# Patient Record
Sex: Male | Born: 1970 | Race: Black or African American | Hispanic: No | Marital: Married | Smoking: Never smoker
Health system: Southern US, Community
[De-identification: ages and names within clinical notes are randomized; demographics above are authoritative.]

## PROBLEM LIST (undated history)

## (undated) DIAGNOSIS — H3530 Unspecified macular degeneration: Secondary | ICD-10-CM

## (undated) DIAGNOSIS — R221 Localized swelling, mass and lump, neck: Secondary | ICD-10-CM

## (undated) DIAGNOSIS — E119 Type 2 diabetes mellitus without complications: Secondary | ICD-10-CM

## (undated) DIAGNOSIS — M25522 Pain in left elbow: Secondary | ICD-10-CM

## (undated) DIAGNOSIS — M79602 Pain in left arm: Secondary | ICD-10-CM

## (undated) DIAGNOSIS — R42 Dizziness and giddiness: Secondary | ICD-10-CM

## (undated) DIAGNOSIS — E785 Hyperlipidemia, unspecified: Secondary | ICD-10-CM

## (undated) DIAGNOSIS — T7840XA Allergy, unspecified, initial encounter: Secondary | ICD-10-CM

## (undated) DIAGNOSIS — K219 Gastro-esophageal reflux disease without esophagitis: Secondary | ICD-10-CM

## (undated) DIAGNOSIS — I10 Essential (primary) hypertension: Secondary | ICD-10-CM

## (undated) HISTORY — DX: Localized swelling, mass and lump, neck: R22.1

## (undated) HISTORY — DX: Essential (primary) hypertension: I10

## (undated) HISTORY — DX: Pain in left elbow: M25.522

## (undated) HISTORY — DX: Hyperlipidemia, unspecified: E78.5

## (undated) HISTORY — DX: Morbid (severe) obesity due to excess calories: E66.01

## (undated) HISTORY — DX: Pain in left arm: M79.602

## (undated) HISTORY — PX: NO PAST SURGERIES: SHX2092

## (undated) HISTORY — DX: Unspecified macular degeneration: H35.30

## (undated) HISTORY — DX: Allergy, unspecified, initial encounter: T78.40XA

## (undated) HISTORY — DX: Dizziness and giddiness: R42

## (undated) HISTORY — DX: Gastro-esophageal reflux disease without esophagitis: K21.9

---

## 2010-09-16 DIAGNOSIS — R221 Localized swelling, mass and lump, neck: Secondary | ICD-10-CM

## 2010-09-16 HISTORY — DX: Localized swelling, mass and lump, neck: R22.1

## 2010-09-25 ENCOUNTER — Encounter: Admission: RE | Admit: 2010-09-25 | Discharge: 2010-09-25 | Payer: Self-pay | Admitting: Family Medicine

## 2011-05-07 ENCOUNTER — Encounter: Payer: Self-pay | Admitting: Nurse Practitioner

## 2013-03-22 ENCOUNTER — Other Ambulatory Visit: Payer: Self-pay | Admitting: *Deleted

## 2013-03-22 MED ORDER — NIACIN ER (ANTIHYPERLIPIDEMIC) 1000 MG PO TBCR: 1000.0000 mg | EXTENDED_RELEASE_TABLET | Freq: Every day | ORAL | Status: DC

## 2013-03-27 ENCOUNTER — Telehealth: Payer: Self-pay | Admitting: Family Medicine

## 2013-03-27 NOTE — Telephone Encounter (Signed)
[facility] CALLED IT

## 2013-04-03 ENCOUNTER — Other Ambulatory Visit: Payer: Self-pay

## 2013-04-03 MED ORDER — NIACIN ER (ANTIHYPERLIPIDEMIC) 1000 MG PO TBCR
1000.0000 mg | EXTENDED_RELEASE_TABLET | Freq: Every day | ORAL | Status: DC
Start: 2013-04-03 — End: 2013-04-24

## 2013-04-24 ENCOUNTER — Ambulatory Visit (INDEPENDENT_AMBULATORY_CARE_PROVIDER_SITE_OTHER): Payer: BC Managed Care – PPO | Admitting: Family Medicine

## 2013-04-24 ENCOUNTER — Encounter: Payer: Self-pay | Admitting: Family Medicine

## 2013-04-24 VITALS — BP 121/82 | HR 67 | Temp 97.0°F | Wt 309.8 lb

## 2013-04-24 DIAGNOSIS — E785 Hyperlipidemia, unspecified: Secondary | ICD-10-CM | POA: Insufficient documentation

## 2013-04-24 DIAGNOSIS — M109 Gout, unspecified: Secondary | ICD-10-CM | POA: Insufficient documentation

## 2013-04-24 DIAGNOSIS — E669 Obesity, unspecified: Secondary | ICD-10-CM

## 2013-04-24 DIAGNOSIS — E119 Type 2 diabetes mellitus without complications: Secondary | ICD-10-CM | POA: Insufficient documentation

## 2013-04-24 LAB — HEPATIC FUNCTION PANEL
ALT: 20 U/L (ref 0–53)
AST: 17 U/L (ref 0–37)
Albumin: 4.3 g/dL (ref 3.5–5.2)
Alkaline Phosphatase: 56 U/L (ref 39–117)
Bilirubin, Direct: 0.2 mg/dL (ref 0.0–0.3)
Indirect Bilirubin: 0.8 mg/dL (ref 0.0–0.9)
Total Bilirubin: 1 mg/dL (ref 0.3–1.2)
Total Protein: 6.8 g/dL (ref 6.0–8.3)

## 2013-04-24 LAB — URIC ACID: Uric Acid, Serum: 5.8 mg/dL (ref 4.0–6.0)

## 2013-04-24 LAB — POCT UA - MICROALBUMIN: Microalbumin Ur, POC: 20 mg/dL

## 2013-04-24 LAB — BASIC METABOLIC PANEL WITH GFR
BUN: 7 mg/dL (ref 6–23)
CO2: 30 mEq/L (ref 19–32)
Calcium: 9.8 mg/dL (ref 8.4–10.5)
Chloride: 106 mEq/L (ref 96–112)
Creat: 0.81 mg/dL (ref 0.50–1.35)
GFR, Est African American: >89 mL/min
GFR, Est Non African American: >89 mL/min
Glucose, Bld: 92 mg/dL (ref 70–99)
Potassium: 4.1 mEq/L (ref 3.5–5.3)
Sodium: 140 mEq/L (ref 135–145)

## 2013-04-24 LAB — POCT GLYCOSYLATED HEMOGLOBIN (HGB A1C): Hemoglobin A1C: 5.5

## 2013-04-24 MED ORDER — NIACIN ER (ANTIHYPERLIPIDEMIC) 1000 MG PO TBCR: 1000.0000 mg | EXTENDED_RELEASE_TABLET | Freq: Every day | ORAL | Status: DC

## 2013-04-24 MED ORDER — LISINOPRIL 2.5 MG PO TABS: 2.5000 mg | ORAL_TABLET | Freq: Every day | ORAL | Status: DC

## 2013-04-24 MED ORDER — ROSUVASTATIN CALCIUM 10 MG PO TABS: 10.0000 mg | ORAL_TABLET | Freq: Every day | ORAL | Status: DC

## 2013-04-24 MED ORDER — METFORMIN HCL 500 MG PO TABS: 500.0000 mg | ORAL_TABLET | Freq: Every day | ORAL | Status: DC

## 2013-04-24 MED ORDER — ALLOPURINOL 300 MG PO TABS: 300.0000 mg | ORAL_TABLET | Freq: Every day | ORAL | Status: DC

## 2013-04-24 NOTE — Progress Notes (Signed)
Patient ID: Alex Harrison, male   DOB: [birth year], 42 y.o.   MRN: [id number] SUBJECTIVE: HPI: Patient is here for follow up of hyperlipidemia/gout/Diabetes Mellitus/obesity:  .Symptoms of DM:has had Nocturia once in a while. ,deniesUrinary Frequency ,denies Blurred vision ,deniesDizziness,denies.Dysuria,deniesparesthesias, deniesextremity pain or ulcers.Marland Kitchendenieschest pain. .has hadan annual eye exam. do check the feet. doescheck CBGs. Average CBG:____?____.Marland Kitchen deniesto episodes of hypoglycemia. doeshave an emergency hypoglycemic plan. admits toCompliance with medications. deniesProblems with medications. Has made lifestyle changes, lost weight and had to reduce the metformin to once daily.   PMH/PSH: reviewed/updated in Epic  SH/FH: reviewed/updated in Epic  Allergies: reviewed/updated in Epic  Medications: reviewed/updated in Epic  Immunizations: reviewed/updated in Epic  ROS: As above in the HPI. All other systems are stable or negative.  OBJECTIVE: APPEARANCE:  Patient in no acute distress.The patient appeared well nourished and normally developed. Acyanotic. Waist:58 VITAL SIGNS:BP 121/82  Pulse 67  Temp(Src) 97 F (36.1 C) (Oral)  Wt 309 lb 12.8 oz (140.524 kg) Pleasant AA male  SKIN: warm and  Dry without overt rashes, tattoos and scars  HEAD and Neck: without JVD, Head and scalp: normal Eyes:No scleral icterus. Fundi normal, eye movements normal. Ears: Auricle normal, canal normal, Tympanic membranes normal, insufflation normal. Nose: normal Throat: normal Neck & thyroid: normal  CHEST & LUNGS: Chest wall: normal Lungs: Clear  CVS: Reveals the PMI to be normally located. Regular rhythm, First and Second Heart sounds are normal,  absence of murmurs, rubs or gallops. Peripheral vasculature: Radial pulses: normal Dorsal pedis pulses: normal Posterior pulses: normal  ABDOMEN:  Appearance:obese Benign,, no organomegaly, no masses, no Abdominal Aortic  enlargement. No Guarding , no rebound. No Bruits. Bowel sounds: normal  RECTAL: N/A GU: N/A  EXTREMETIES: nonedematous. Both Femoral and Pedal pulses are normal.  MUSCULOSKELETAL:  Spine: normal Joints: intact  NEUROLOGIC: oriented to time,place and person; nonfocal.  ASSESSMENT: HLD (hyperlipidemia) - Plan: niacin (NIASPAN) 1000 MG CR tablet, rosuvastatin (CRESTOR) 10 MG tablet, Hepatic function panel, NMR Lipoprofile with Lipids  DM (diabetes mellitus) - Plan: metFORMIN (GLUCOPHAGE) 500 MG tablet, lisinopril (PRINIVIL,ZESTRIL) 2.5 MG tablet, BASIC METABOLIC PANEL WITH GFR, POCT glycosylated hemoglobin (Hb A1C), POCT UA - Microalbumin  Obesity, unspecified  Gout - Plan: allopurinol (ZYLOPRIM) 300 MG tablet, Uric acid  PLAN: Orders Placed This Encounter  Procedures  . BASIC METABOLIC PANEL WITH GFR  . Hepatic function panel  . NMR Lipoprofile with Lipids  . Uric acid  . POCT glycosylated hemoglobin (Hb A1C)  . POCT UA - Microalbumin   No results found for this or any previous visit. Meds ordered this encounter  Medications  . DISCONTD: lisinopril (PRINIVIL,ZESTRIL) 2.5 MG tablet    Sig: Take 2.5 mg by mouth daily.   Marland Kitchen LOTEMAX 0.5 % ophthalmic suspension    Sig: Place 1 drop into both eyes 4 (four) times daily.   Marland Kitchen DISCONTD: metFORMIN (GLUCOPHAGE) 500 MG tablet    Sig: Take 500 mg by mouth daily with breakfast.   . niacin (NIASPAN) 1000 MG CR tablet    Sig: Take 1 tablet (1,000 mg total) by mouth at bedtime.    Dispense:  30 tablet    Refill:  5  . rosuvastatin (CRESTOR) 10 MG tablet    Sig: Take 1 tablet (10 mg total) by mouth daily.    Dispense:  30 tablet    Refill:  5  . metFORMIN (GLUCOPHAGE) 500 MG tablet    Sig: Take 1 tablet (500 mg total) by mouth daily with  breakfast.    Dispense:  30 tablet    Refill:  5  . lisinopril (PRINIVIL,ZESTRIL) 2.5 MG tablet    Sig: Take 1 tablet (2.5 mg total) by mouth daily.    Dispense:  30 tablet    Refill:  5  .  allopurinol (ZYLOPRIM) 300 MG tablet    Sig: Take 1 tablet (300 mg total) by mouth daily.    Dispense:  30 tablet    Refill:  5   Discussed at length lefestyle changes.       Dr Woodroe Mode Recommendations  Diet and Exercise discussed with patient.  For nutrition information, I recommend books:  1).Eat to Live by Dr Monico Hoar. 2).Prevent and Reverse Heart Disease by Dr Suzzette Righter. 3) Dr Katherina Right reversing diabetes.  Exercise recommendations are:  If unable to walk, then the patient can exercise in a chair 3 times a day. By flapping arms like a bird gently and raising legs outwards to the front.  If ambulatory, the patient can go for walks for 30 minutes 3 times a week. Then increase the intensity and duration as tolerated.  Goal is to try to attain exercise frequency to 5 times a week.  If applicable: Best to perform resistance exercises (machines or weights) 2 days a week and cardio type exercises 3 days per week.  RTC in 3 months.  Alex Harrison P. [place] Charon, M.D.

## 2013-04-24 NOTE — Patient Instructions (Addendum)
Dr Woodroe Mode Recommendations  Diet and Exercise discussed with patient.  For nutrition information, I recommend books:  1).Eat to Live by Dr Monico Hoar. 2).Prevent and Reverse Heart Disease by Dr Suzzette Righter. 3) Dr Lequita Asal Reversing Diabetes.  Exercise recommendations are:  If unable to walk, then the patient can exercise in a chair 3 times a day. By flapping arms like a bird gently and raising legs outwards to the front.  If ambulatory, the patient can go for walks for 30 minutes 3 times a week. Then increase the intensity and duration as tolerated.  Goal is to try to attain exercise frequency to 5 times a week.  If applicable: Best to perform resistance exercises (machines or weights) 2 days a week and cardio type exercises 3 days per week. Diabetes and Exercise Regular exercise is important and can help:   Control blood glucose (sugar).  Decrease blood pressure.    Control blood lipids (cholesterol, triglycerides).  Improve overall health. BENEFITS FROM EXERCISE  Improved fitness.  Improved flexibility.  Improved endurance.  Increased bone density.  Weight control.  Increased muscle strength.  Decreased body fat.  Improvement of the body's use of insulin, a hormone.  Increased insulin sensitivity.  Reduction of insulin needs.  Reduced stress and tension.  Helps you feel better. People with diabetes who add exercise to their lifestyle gain additional benefits, including:  Weight loss.  Reduced appetite.  Improvement of the body's use of blood glucose.  Decreased risk factors for heart disease:  Lowering of cholesterol and triglycerides.  Raising the level of good cholesterol (high-density lipoproteins, HDL).  Lowering blood sugar.  Decreased blood pressure. TYPE 1 DIABETES AND EXERCISE  Exercise will usually lower your blood glucose.  If blood glucose is greater than 240 mg/dl, check urine ketones. If ketones are  present, do not exercise.  Location of the insulin injection sites may need to be adjusted with exercise. Avoid injecting insulin into areas of the body that will be exercised. For example, avoid injecting insulin into:  The arms when playing tennis.  The legs when jogging. For more information, discuss this with your caregiver.  Keep a record of:  Food intake.  Type and amount of exercise.  Expected peak times of insulin action.  Blood glucose levels. Do this before, during, and after exercise. Review your records with your caregiver. This will help you to develop guidelines for adjusting food intake and insulin amounts.  TYPE 2 DIABETES AND EXERCISE  Regular physical activity can help control blood glucose.  Exercise is important because it may:  Increase the body's sensitivity to insulin.  Improve blood glucose control.  Exercise reduces the risk of heart disease. It decreases serum cholesterol and triglycerides. It also lowers blood pressure.  Those who take insulin or oral hypoglycemic agents should watch for signs of hypoglycemia. These signs include dizziness, shaking, sweating, chills, and confusion.  Body water is lost during exercise. It must be replaced. This will help to avoid loss of body fluids (dehydration) or heat stroke. Be sure to talk to your caregiver before starting an exercise program to make sure it is safe for you. Remember, any activity is better than none.  Document Released: 02/19/2004 Document Revised: 02/21/2012 Document Reviewed: 06/05/2009 [facility] Patient Information 2013 [place], [place].   Diabetes, Type 2 Diabetes is a long-lasting (chronic) disease. In type 2 diabetes, the pancreas does not make enough insulin (a hormone), and the body does not respond normally to  the insulin that is made. This type of diabetes was also previously called adult-onset diabetes. It usually occurs after the age of 70, but it can occur at any age.  CAUSES  Type 2  diabetes happens because the pancreasis not making enough insulin or your body has trouble using the insulin that your pancreas does make properly. SYMPTOMS   Drinking more than usual.  Urinating more than usual.  Blurred vision.  Dry, itchy skin.  Frequent infections.  Feeling more tired than usual (fatigue). DIAGNOSIS The diagnosis of type 2 diabetes is usually made by one of the following tests:  Fasting blood glucose test. You will not eat for at least 8 hours and then take a blood test.  Random blood glucose test. Your blood glucose (sugar) is checked at any time of the day regardless of when you ate.  Oral glucose tolerance test (OGTT). Your blood glucose is measured after you have not eaten (fasted) and then after you drink a glucose containing beverage. TREATMENT   Healthy eating.  Exercise.  Medicine, if needed.  Monitoring blood glucose.  Seeing your caregiver regularly. HOME CARE INSTRUCTIONS   Check your blood glucose at least once a day. More frequent monitoring may be necessary, depending on your medicines and on how well your diabetes is controlled. Your caregiver will advise you.  Take your medicine as directed by your caregiver.  Do not smoke.  Make wise food choices. Ask your caregiver for information. Weight loss can improve your diabetes.  Learn about low blood glucose (hypoglycemia) and how to treat it.  Get your eyes checked regularly.  Have a yearly physical exam. Have your blood pressure checked and your blood and urine tested.  Wear a pendant or bracelet saying that you have diabetes.  Check your feet every night for cuts, sores, blisters, and redness. Let your caregiver know if you have any problems. SEEK MEDICAL CARE IF:   You have problems keeping your blood glucose in target range.  You have problems with your medicines.  You have symptoms of an illness that do not improve after 24 hours.  You have a sore or wound that is not  healing.  You notice a change in vision or a new problem with your vision.  You have a fever. MAKE SURE YOU:  Understand these instructions.  Will watch your condition.  Will get help right away if you are not doing well or get worse. Document Released: 11/29/2005 Document Revised: 02/21/2012 Document Reviewed: 05/17/2011 [facility] Patient Information 2013 [place], [place].

## 2013-04-25 LAB — MICROALBUMIN, URINE: Microalb, Ur: 1.17 mg/dL (ref 0.00–1.89)

## 2013-04-26 LAB — NMR LIPOPROFILE WITH LIPIDS
Cholesterol, Total: 98 mg/dL (ref <200)
HDL Particle Number: 26.3 umol/L — ABNORMAL LOW (ref >=30.5)
HDL Size: 9.4 nm (ref >=9.2)
HDL-C: 36 mg/dL — ABNORMAL LOW (ref >=40)
LDL (calc): 49 mg/dL (ref <100)
LDL Particle Number: 497 nmol/L (ref <1000)
LDL Size: 20.4 nm — ABNORMAL LOW (ref >20.5)
LP-IR Score: 49 — ABNORMAL HIGH (ref <=45)
Large HDL-P: 4.7 umol/L — ABNORMAL LOW (ref >=4.8)
Large VLDL-P: 3.7 nmol/L — ABNORMAL HIGH (ref <=2.7)
Small LDL Particle Number: 305 nmol/L (ref <=527)
Triglycerides: 67 mg/dL (ref <150)
VLDL Size: 49.7 nm — ABNORMAL HIGH (ref <=46.6)

## 2013-04-26 NOTE — Progress Notes (Signed)
Quick Note:  Lab result at goal.or too well controlled. Change in Medications: Reduce the metformin and the crestor to a 1/2 tablet No Change in plans for follow up. Continued dietary changes and weight loss as he has done and he should be able to come off some meds. Keep up the great work. He is improving his health and saving his life!! ______

## 2013-07-26 ENCOUNTER — Encounter: Payer: Self-pay | Admitting: Family Medicine

## 2013-07-26 ENCOUNTER — Ambulatory Visit (INDEPENDENT_AMBULATORY_CARE_PROVIDER_SITE_OTHER): Payer: BC Managed Care – PPO | Admitting: Family Medicine

## 2013-07-26 VITALS — BP 109/71 | HR 86 | Temp 97.8°F | Ht 69.0 in | Wt 305.0 lb

## 2013-07-26 DIAGNOSIS — E559 Vitamin D deficiency, unspecified: Secondary | ICD-10-CM | POA: Insufficient documentation

## 2013-07-26 DIAGNOSIS — M109 Gout, unspecified: Secondary | ICD-10-CM

## 2013-07-26 DIAGNOSIS — E669 Obesity, unspecified: Secondary | ICD-10-CM

## 2013-07-26 DIAGNOSIS — R635 Abnormal weight gain: Secondary | ICD-10-CM

## 2013-07-26 DIAGNOSIS — E119 Type 2 diabetes mellitus without complications: Secondary | ICD-10-CM

## 2013-07-26 DIAGNOSIS — E785 Hyperlipidemia, unspecified: Secondary | ICD-10-CM

## 2013-07-26 LAB — POCT GLYCOSYLATED HEMOGLOBIN (HGB A1C): Hemoglobin A1C: 5.3

## 2013-07-26 NOTE — Progress Notes (Signed)
Patient ID: Alex Harrison, male   DOB: 12-03-1971, 42 y.o.   MRN: [id number] SUBJECTIVE: CC: Chief Complaint  Patient presents with  . Follow-up    3 month follow up diabetes     HPI: Patient is here for follow up of Diabetes Mellitus/HLD/Obesity/vit D Def.  Lost a couple of pounds but hadn't read the books Symptoms evaluated: Denies Nocturia ,Denies Urinary Frequency , denies Blurred vision ,deniesDizziness,denies.Dysuria,denies paresthesias, denies extremity pain or ulcers.Marland Kitchendenies chest pain. has had an annual eye exam. do check the feet. Does check CBGs. Average CBG:100 but doesn't check it regularly. Denies episodes of hypoglycemia. Does have an emergency hypoglycemic plan. admits toCompliance with medications. Denies Problems with medications. Didn't buy the books to read on DM and  Nutrition.  Past Medical History  Diagnosis Date  . Gout   . Morbid obesity   . Hyperlipidemia   . Allergic rhinitis   . Neck nodule 09/16/10    right   . Left elbow pain   . Left arm pain    No past surgical history on file. History   Social History  . Marital Status: Married    Spouse Name: N/A    Number of Children: N/A  . Years of Education: N/A   Occupational History  . Not on file.   Social History Main Topics  . Smoking status: Never Smoker   . Smokeless tobacco: Not on file  . Alcohol Use: Not on file  . Drug Use: Not on file  . Sexual Activity: Not on file   Other Topics Concern  . Not on file   Social History Narrative  . No narrative on file   No family history on file. Current Outpatient Prescriptions on File Prior to Visit  Medication Sig Dispense Refill  . allopurinol (ZYLOPRIM) 300 MG tablet Take 1 tablet (300 mg total) by mouth daily.  30 tablet  5  . Cholecalciferol (VITAMIN D-3) 5000 UNITS TABS Take by mouth.        Marland Kitchen lisinopril (PRINIVIL,ZESTRIL) 2.5 MG tablet Take 1 tablet (2.5 mg total) by mouth daily.  30 tablet  5  . metFORMIN (GLUCOPHAGE) 500 MG  tablet Take 1 tablet (500 mg total) by mouth daily with breakfast.  30 tablet  5  . niacin (NIASPAN) 1000 MG CR tablet Take 1 tablet (1,000 mg total) by mouth at bedtime.  30 tablet  5  . omega-3 acid ethyl esters (LOVAZA) 1 G capsule Take 2 g by mouth 2 (two) times daily.        . rosuvastatin (CRESTOR) 10 MG tablet Take 1 tablet (10 mg total) by mouth daily.  30 tablet  5   No current facility-administered medications on file prior to visit.   Allergies  Allergen Reactions  . Sulfa Antibiotics    Immunization History  Administered Date(s) Administered  . Tdap 11/12/2009   Prior to Admission medications   Medication Sig Start Date End Date Taking? Authorizing Provider  allopurinol (ZYLOPRIM) 300 MG tablet Take 1 tablet (300 mg total) by mouth daily. 04/24/13  Yes Ileana Ladd, MD  Cholecalciferol (VITAMIN D-3) 5000 UNITS TABS Take by mouth.     Yes Historical Provider, MD  lisinopril (PRINIVIL,ZESTRIL) 2.5 MG tablet Take 1 tablet (2.5 mg total) by mouth daily. 04/24/13  Yes Ileana Ladd, MD  metFORMIN (GLUCOPHAGE) 500 MG tablet Take 1 tablet (500 mg total) by mouth daily with breakfast. 04/24/13  Yes Ileana Ladd, MD  niacin (NIASPAN) 1000 MG CR  tablet Take 1 tablet (1,000 mg total) by mouth at bedtime. 04/24/13  Yes Ileana Ladd, MD  omega-3 acid ethyl esters (LOVAZA) 1 G capsule Take 2 g by mouth 2 (two) times daily.     Yes Historical Provider, MD  rosuvastatin (CRESTOR) 10 MG tablet Take 1 tablet (10 mg total) by mouth daily. 04/24/13  Yes Ileana Ladd, MD     ROS: As above in the HPI. All other systems are stable or negative.  OBJECTIVE: APPEARANCE:  Patient in no acute distress.The patient appeared well nourished and normally developed. Acyanotic. Waist:55.5 inches VITAL SIGNS:BP 109/71  Pulse 86  Temp(Src) 97.8 F (36.6 C) (Oral)  Ht 5\' 9"  (1.753 m)  Wt 305 lb (138.347 kg)  BMI 45.02 kg/m2  AAM  SKIN: warm and  Dry without overt rashes, tattoos and  scars  HEAD and Neck: without JVD, Head and scalp: normal Eyes:No scleral icterus. Fundi normal, eye movements normal. Ears: Auricle normal, canal normal, Tympanic membranes normal, insufflation normal. Nose: normal Throat: normal Neck & thyroid: normal  CHEST & LUNGS: Chest wall: normal Lungs: Clear  CVS: Reveals the PMI to be normally located. Regular rhythm, First and Second Heart sounds are normal,  absence of murmurs, rubs or gallops. Peripheral vasculature: Radial pulses: normal Dorsal pedis pulses: normal Posterior pulses: normal  ABDOMEN:  Appearance: normal Benign, no organomegaly, no masses, no Abdominal Aortic enlargement. No Guarding , no rebound. No Bruits. Bowel sounds: normal  RECTAL: N/A GU: N/A  EXTREMETIES: nonedematous. Both Femoral and Pedal pulses are normal.  MUSCULOSKELETAL:  Spine: normal Joints: intact  NEUROLOGIC: oriented to time,place and person; nonfocal. Strength is normal Sensory is normal Reflexes are normal Cranial Nerves are normal. Results for orders placed in visit on 04/24/13  BASIC METABOLIC PANEL WITH GFR      Result Value Range   Sodium 140  135 - 145 mEq/L   Potassium 4.1  3.5 - 5.3 mEq/L   Chloride 106  96 - 112 mEq/L   CO2 30  19 - 32 mEq/L   Glucose, Bld 92  70 - 99 mg/dL   BUN 7  6 - 23 mg/dL   Creat 1.61  0.96 - 0.45 mg/dL   Calcium 9.8  8.4 - 40.9 mg/dL   GFR, Est African American >89     GFR, Est Non African American >89    HEPATIC FUNCTION PANEL      Result Value Range   Total Bilirubin 1.0  0.3 - 1.2 mg/dL   Bilirubin, Direct 0.2  0.0 - 0.3 mg/dL   Indirect Bilirubin 0.8  0.0 - 0.9 mg/dL   Alkaline Phosphatase 56  39 - 117 U/L   AST 17  0 - 37 U/L   ALT 20  0 - 53 U/L   Total Protein 6.8  6.0 - 8.3 g/dL   Albumin 4.3  3.5 - 5.2 g/dL  NMR LIPOPROFILE WITH LIPIDS      Result Value Range   LDL Particle Number 497  <1000 nmol/L   LDL (calc) 49  <100 mg/dL   HDL-C 36 (*) >=81 mg/dL   Triglycerides 67   <191 mg/dL   Cholesterol, Total 98  <200 mg/dL   HDL Particle Number 47.8 (*) >=30.5 umol/L   Large HDL-P 4.7 (*) >=4.8 umol/L   Large VLDL-P 3.7 (*) <=2.7 nmol/L   Small LDL Particle Number 305  <=527 nmol/L   LDL Size 20.4 (*) >20.5 nm   HDL Size 9.4  >=  9.2 nm   VLDL Size 49.7 (*) <=46.6 nm   LP-IR Score 49 (*) <=45  URIC ACID      Result Value Range   Uric Acid, Serum 5.8  4.0 - 6.0 mg/dL  MICROALBUMIN, URINE      Result Value Range   Microalb, Ur 1.17  0.00 - 1.89 mg/dL  POCT GLYCOSYLATED HEMOGLOBIN (HGB A1C)      Result Value Range   Hemoglobin A1C 5.5%    POCT UA - MICROALBUMIN      Result Value Range   Microalbumin Ur, POC 20 mg/l      ASSESSMENT: DM (diabetes mellitus) - Plan: HgB A1c, CMP14+EGFR  HLD (hyperlipidemia) - Plan: CMP14+EGFR, NMR, lipoprofile  Obesity, unspecified  Gout - Plan: Uric Acid  Unspecified vitamin D deficiency - Plan: Vitamin D 25 hydroxy  PLAN:  Orders Placed This Encounter  Procedures  . Uric Acid  . CMP14+EGFR  . NMR, lipoprofile  . Vitamin D 25 hydroxy  . HgB A1c    Same medications.       Dr Woodroe Mode Recommendations  Diet and Exercise discussed with patient.  For nutrition information, I recommend books:  1).Eat to Live by Dr Monico Hoar. 2).Prevent and Reverse Heart Disease by Dr Suzzette Righter. 3) Dr Katherina Right Book:  Program to Reverse Diabetes  Exercise recommendations are:  If unable to walk, then the patient can exercise in a chair 3 times a day. By flapping arms like a bird gently and raising legs outwards to the front.  If ambulatory, the patient can go for walks for 30 minutes 3 times a week. Then increase the intensity and duration as tolerated.  Goal is to try to attain exercise frequency to 5 times a week.  If applicable: Best to perform resistance exercises (machines or weights) 2 days a week and cardio type exercises 3 days per week.  Encouraged patient to continue the LTC.  Reviewed  previous visit and labs.  Return in about 3 months (around 10/26/2013) for Recheck medical problems.  Elzie Sheets P. [place] Charon, M.D.

## 2013-07-26 NOTE — Patient Instructions (Addendum)
      Dr Clent Damore's Recommendations  Diet and Exercise discussed with patient.  For nutrition information, I recommend books:  1).Eat to Live by Dr Joel Fuhrman. 2).Prevent and Reverse Heart Disease by Dr Caldwell Esselstyn. 3) Dr Neal Barnard's Book:  Program to Reverse Diabetes  Exercise recommendations are:  If unable to walk, then the patient can exercise in a chair 3 times a day. By flapping arms like a bird gently and raising legs outwards to the front.  If ambulatory, the patient can go for walks for 30 minutes 3 times a week. Then increase the intensity and duration as tolerated.  Goal is to try to attain exercise frequency to 5 times a week.  If applicable: Best to perform resistance exercises (machines or weights) 2 days a week and cardio type exercises 3 days per week.  

## 2013-07-28 LAB — NMR, LIPOPROFILE
Cholesterol: 109 mg/dL (ref <200)
HDL Cholesterol by NMR: 47 mg/dL (ref >=40)
HDL Particle Number: 28.1 umol/L — ABNORMAL LOW (ref >=30.5)
LDL Particle Number: 765 nmol/L (ref <1000)
LDL Size: 20.2 nm — ABNORMAL LOW (ref >20.5)
LDLC SERPL CALC-MCNC: 51 mg/dL (ref <100)
LP-IR Score: 48 — ABNORMAL HIGH (ref <=45)
Small LDL Particle Number: 522 nmol/L (ref <=527)
Triglycerides by NMR: 54 mg/dL (ref <150)

## 2013-07-28 LAB — CMP14+EGFR
ALT: 15 IU/L (ref 0–44)
AST: 17 IU/L (ref 0–40)
Albumin/Globulin Ratio: 2 (ref 1.1–2.5)
Albumin: 4.2 g/dL (ref 3.5–5.5)
Alkaline Phosphatase: 66 IU/L (ref 39–117)
BUN/Creatinine Ratio: 9 (ref 9–20)
BUN: 7 mg/dL (ref 6–24)
CO2: 26 mmol/L (ref 18–29)
Calcium: 9 mg/dL (ref 8.7–10.2)
Chloride: 105 mmol/L (ref 97–108)
Creatinine, Ser: 0.81 mg/dL (ref 0.76–1.27)
GFR calc Af Amer: 126 mL/min/{1.73_m2} (ref >59)
GFR calc non Af Amer: 109 mL/min/{1.73_m2} (ref >59)
Globulin, Total: 2.1 g/dL (ref 1.5–4.5)
Glucose: 89 mg/dL (ref 65–99)
Potassium: 4.1 mmol/L (ref 3.5–5.2)
Sodium: 143 mmol/L (ref 134–144)
Total Bilirubin: 0.8 mg/dL (ref 0.0–1.2)
Total Protein: 6.3 g/dL (ref 6.0–8.5)

## 2013-07-28 LAB — VITAMIN D 25 HYDROXY (VIT D DEFICIENCY, FRACTURES): Vit D, 25-Hydroxy: 24.9 ng/mL — ABNORMAL LOW (ref 30.0–100.0)

## 2013-07-28 LAB — URIC ACID: Uric Acid: 5.2 mg/dL (ref 3.7–8.6)

## 2013-07-29 NOTE — Progress Notes (Signed)
Quick Note:  Lab result at or close to goal. The HGBA1C is too tightly controlled. The Vitamin is too low.  Change in Medications: Reduce the metformin to 1/2 a tablet daily. Diet and Weight loss and in the near future will be able to come off diabetes medication. Increase the vitamin D to 7000 units daily. No Change in plans for follow up. ______

## 2013-08-28 ENCOUNTER — Encounter: Payer: Self-pay | Admitting: *Deleted

## 2013-10-18 ENCOUNTER — Other Ambulatory Visit: Payer: Self-pay

## 2013-11-01 ENCOUNTER — Ambulatory Visit: Payer: BC Managed Care – PPO | Admitting: Family Medicine

## 2013-11-06 ENCOUNTER — Ambulatory Visit (INDEPENDENT_AMBULATORY_CARE_PROVIDER_SITE_OTHER): Payer: BC Managed Care – PPO | Admitting: Family Medicine

## 2013-11-06 ENCOUNTER — Encounter: Payer: Self-pay | Admitting: Family Medicine

## 2013-11-06 VITALS — BP 110/62 | HR 74 | Temp 97.4°F | Ht 69.5 in | Wt 301.4 lb

## 2013-11-06 DIAGNOSIS — E785 Hyperlipidemia, unspecified: Secondary | ICD-10-CM

## 2013-11-06 DIAGNOSIS — E669 Obesity, unspecified: Secondary | ICD-10-CM

## 2013-11-06 DIAGNOSIS — E119 Type 2 diabetes mellitus without complications: Secondary | ICD-10-CM

## 2013-11-06 DIAGNOSIS — R635 Abnormal weight gain: Secondary | ICD-10-CM

## 2013-11-06 DIAGNOSIS — E559 Vitamin D deficiency, unspecified: Secondary | ICD-10-CM

## 2013-11-06 DIAGNOSIS — M109 Gout, unspecified: Secondary | ICD-10-CM

## 2013-11-06 LAB — POCT GLYCOSYLATED HEMOGLOBIN (HGB A1C): Hemoglobin A1C: 5.3

## 2013-11-06 MED ORDER — ROSUVASTATIN CALCIUM 10 MG PO TABS: 10.0000 mg | ORAL_TABLET | Freq: Every day | ORAL | Status: DC

## 2013-11-06 MED ORDER — LISINOPRIL 2.5 MG PO TABS: 2.5000 mg | ORAL_TABLET | Freq: Every day | ORAL | Status: DC

## 2013-11-06 MED ORDER — ALLOPURINOL 300 MG PO TABS: 300.0000 mg | ORAL_TABLET | Freq: Every day | ORAL | Status: DC

## 2013-11-06 NOTE — Progress Notes (Addendum)
Patient ID: Alex Harrison, male   DOB: 01/12/1971, 42 y.o.   MRN: [id number] SUBJECTIVE: CC: Chief Complaint  Patient presents with  . Follow-up    3 month follow up chronic problems     HPI:  Patient is here for follow up of Diabetes Mellitus/obesity/HLD/Vit D def.: Symptoms evaluated: Denies Nocturia ,Denies Urinary Frequency , denies Blurred vision ,deniesDizziness,denies.Dysuria,denies paresthesias, denies extremity pain or ulcers.Marland Kitchendenies chest pain. has had an annual eye exam. do check the feet. Does check CBGs. Average CBG:103 Denies episodes of hypoglycemia. Does have an emergency hypoglycemic plan. admits toCompliance with medications. Denies Problems with medications.  Past Medical History  Diagnosis Date  . Gout   . Morbid obesity   . Hyperlipidemia   . Allergic rhinitis   . Neck nodule 09/16/10    right   . Left elbow pain   . Left arm pain    No past surgical history on file. History   Social History  . Marital Status: Married    Spouse Name: N/A    Number of Children: N/A  . Years of Education: N/A   Occupational History  . Not on file.   Social History Main Topics  . Smoking status: Never Smoker   . Smokeless tobacco: Not on file  . Alcohol Use: Not on file  . Drug Use: Not on file  . Sexual Activity: Not on file   Other Topics Concern  . Not on file   Social History Narrative  . No narrative on file   No family history on file. Current Outpatient Prescriptions on File Prior to Visit  Medication Sig Dispense Refill  . Cholecalciferol (VITAMIN D-3) 5000 UNITS TABS Take by mouth.        . niacin (NIASPAN) 1000 MG CR tablet Take 1 tablet (1,000 mg total) by mouth at bedtime.  30 tablet  5  . omega-3 acid ethyl esters (LOVAZA) 1 G capsule Take 2 g by mouth 2 (two) times daily.         No current facility-administered medications on file prior to visit.   Allergies  Allergen Reactions  . Sulfa Antibiotics    Immunization History   Administered Date(s) Administered  . Influenza,inj,Quad PF,36+ Mos 11/06/2013  . Tdap 11/12/2009   Prior to Admission medications   Medication Sig Start Date End Date Taking? Authorizing Provider  allopurinol (ZYLOPRIM) 300 MG tablet Take 1 tablet (300 mg total) by mouth daily. 04/24/13  Yes Ileana Ladd, MD  Cholecalciferol (VITAMIN D-3) 5000 UNITS TABS Take by mouth.     Yes Historical Provider, MD  lisinopril (PRINIVIL,ZESTRIL) 2.5 MG tablet Take 1 tablet (2.5 mg total) by mouth daily. 04/24/13  Yes Ileana Ladd, MD  metFORMIN (GLUCOPHAGE) 500 MG tablet Take 1 tablet (500 mg total) by mouth daily with breakfast. 04/24/13  Yes Ileana Ladd, MD  niacin (NIASPAN) 1000 MG CR tablet Take 1 tablet (1,000 mg total) by mouth at bedtime. 04/24/13  Yes Ileana Ladd, MD  omega-3 acid ethyl esters (LOVAZA) 1 G capsule Take 2 g by mouth 2 (two) times daily.     Yes Historical Provider, MD  rosuvastatin (CRESTOR) 10 MG tablet Take 1 tablet (10 mg total) by mouth daily. 04/24/13  Yes Ileana Ladd, MD     ROS: As above in the HPI. All other systems are stable or negative.  OBJECTIVE: APPEARANCE:  Patient in no acute distress.The patient appeared well nourished and normally developed. Acyanotic. Waist: VITAL SIGNS:BP 110/62  Pulse 74  Temp(Src) 97.4 F (36.3 C) (Oral)  Ht 5' 9.5" (1.765 m)  Wt 301 lb 6.4 oz (136.714 kg)  BMI 43.89 kg/m2 Obese AAM  SKIN: warm and  Dry without overt rashes, tattoos and scars  HEAD and Neck: without JVD, Head and scalp: normal Eyes:No scleral icterus. Fundi normal, eye movements normal. Ears: Auricle normal, canal normal, Tympanic membranes normal, insufflation normal. Nose: normal Throat: normal Neck & thyroid: normal  CHEST & LUNGS: Chest wall: normal Lungs: Clear  CVS: Reveals the PMI to be normally located. Regular rhythm, First and Second Heart sounds are normal,  absence of murmurs, rubs or gallops. Peripheral vasculature: Radial  pulses: normal Dorsal pedis pulses: normal Posterior pulses: normal  ABDOMEN:  Appearance: normal Benign, no organomegaly, no masses, no Abdominal Aortic enlargement. No Guarding , no rebound. No Bruits. Bowel sounds: normal  RECTAL: N/A GU: N/A  EXTREMETIES: nonedematous.  MUSCULOSKELETAL:  Spine: normal Joints: intact  NEUROLOGIC: oriented to time,place and person; nonfocal. Strength is normal Sensory is normal Reflexes are normal Cranial Nerves are normal.  ASSESSMENT: DM (diabetes mellitus) - Plan: POCT glycosylated hemoglobin (Hb A1C), CMP14+EGFR, lisinopril (PRINIVIL,ZESTRIL) 2.5 MG tablet  Gout - Plan: Uric acid, allopurinol (ZYLOPRIM) 300 MG tablet  HLD (hyperlipidemia) - Plan: CMP14+EGFR, Lipid panel, rosuvastatin (CRESTOR) 10 MG tablet  Obesity, unspecified  Unspecified vitamin D deficiency - Plan: Vit D  25 hydroxy (rtn osteoporosis monitoring)  PLAN:      Dr Woodroe Mode Recommendations  For nutrition information, I recommend books:  1).Eat to Live by Dr Monico Hoar. 2).Prevent and Reverse Heart Disease by Dr Suzzette Righter. 3) Dr Katherina Right Book:  Program to Reverse Diabetes  Exercise recommendations are:  If unable to walk, then the patient can exercise in a chair 3 times a day. By flapping arms like a bird gently and raising legs outwards to the front.  If ambulatory, the patient can go for walks for 30 minutes 3 times a week. Then increase the intensity and duration as tolerated.  Goal is to try to attain exercise frequency to 5 times a week.  If applicable: Best to perform resistance exercises (machines or weights) 2 days a week and cardio type exercises 3 days per week.  Orders Placed This Encounter  Procedures  . CMP14+EGFR  . Lipid panel  . Vit D  25 hydroxy (rtn osteoporosis monitoring)  . Uric acid  . POCT glycosylated hemoglobin (Hb A1C)   Meds ordered this encounter  Medications  . metFORMIN (GLUCOPHAGE) 500 MG tablet     Sig: Take 250 mg by mouth daily with breakfast.  . rosuvastatin (CRESTOR) 10 MG tablet    Sig: Take 1 tablet (10 mg total) by mouth daily.    Dispense:  30 tablet    Refill:  5  . allopurinol (ZYLOPRIM) 300 MG tablet    Sig: Take 1 tablet (300 mg total) by mouth daily.    Dispense:  30 tablet    Refill:  5  . lisinopril (PRINIVIL,ZESTRIL) 2.5 MG tablet    Sig: Take 1 tablet (2.5 mg total) by mouth daily.    Dispense:  30 tablet    Refill:  5   Medications Discontinued During This Encounter  Medication Reason  . metFORMIN (GLUCOPHAGE) 500 MG tablet   . rosuvastatin (CRESTOR) 10 MG tablet Reorder  . allopurinol (ZYLOPRIM) 300 MG tablet Reorder  . lisinopril (PRINIVIL,ZESTRIL) 2.5 MG tablet Reorder   Return in about 3 months (around 02/06/2014) for Recheck  medical problems.  Semaj Kham P. Jacelyn Grip, M.D.

## 2013-11-06 NOTE — Patient Instructions (Signed)
      Dr Aricela Bertagnolli's Recommendations  For nutrition information, I recommend books:  1).Eat to Live by Dr Joel Fuhrman. 2).Prevent and Reverse Heart Disease by Dr Caldwell Esselstyn. 3) Dr Neal Barnard's Book:  Program to Reverse Diabetes  Exercise recommendations are:  If unable to walk, then the patient can exercise in a chair 3 times a day. By flapping arms like a bird gently and raising legs outwards to the front.  If ambulatory, the patient can go for walks for 30 minutes 3 times a week. Then increase the intensity and duration as tolerated.  Goal is to try to attain exercise frequency to 5 times a week.  If applicable: Best to perform resistance exercises (machines or weights) 2 days a week and cardio type exercises 3 days per week.  

## 2013-11-07 LAB — LIPID PANEL
Chol/HDL Ratio: 2.4 ratio units (ref 0.0–5.0)
Cholesterol, Total: 110 mg/dL (ref 100–199)
HDL: 46 mg/dL (ref >39)
LDL Calculated: 53 mg/dL (ref 0–99)
Triglycerides: 55 mg/dL (ref 0–149)
VLDL Cholesterol Cal: 11 mg/dL (ref 5–40)

## 2013-11-07 LAB — CMP14+EGFR
ALT: 16 IU/L (ref 0–44)
AST: 18 IU/L (ref 0–40)
Albumin/Globulin Ratio: 1.9 (ref 1.1–2.5)
Albumin: 4.3 g/dL (ref 3.5–5.5)
Alkaline Phosphatase: 67 IU/L (ref 39–117)
BUN/Creatinine Ratio: 10 (ref 9–20)
BUN: 9 mg/dL (ref 6–24)
CO2: 26 mmol/L (ref 18–29)
Calcium: 9.4 mg/dL (ref 8.7–10.2)
Chloride: 104 mmol/L (ref 97–108)
Creatinine, Ser: 0.93 mg/dL (ref 0.76–1.27)
GFR calc Af Amer: 115 mL/min/{1.73_m2} (ref >59)
GFR calc non Af Amer: 99 mL/min/{1.73_m2} (ref >59)
Globulin, Total: 2.3 g/dL (ref 1.5–4.5)
Glucose: 80 mg/dL (ref 65–99)
Potassium: 4.3 mmol/L (ref 3.5–5.2)
Sodium: 143 mmol/L (ref 134–144)
Total Bilirubin: 0.9 mg/dL (ref 0.0–1.2)
Total Protein: 6.6 g/dL (ref 6.0–8.5)

## 2013-11-07 LAB — URIC ACID: Uric Acid: 5.8 mg/dL (ref 3.7–8.6)

## 2013-11-07 LAB — VITAMIN D 25 HYDROXY (VIT D DEFICIENCY, FRACTURES): Vit D, 25-Hydroxy: 35.2 ng/mL (ref 30.0–100.0)

## 2014-02-06 ENCOUNTER — Telehealth: Payer: Self-pay | Admitting: Family Medicine

## 2014-02-06 NOTE — Telephone Encounter (Signed)
error 

## 2014-02-07 ENCOUNTER — Ambulatory Visit: Payer: BC Managed Care – PPO | Admitting: Family Medicine

## 2014-03-04 ENCOUNTER — Encounter: Payer: Self-pay | Admitting: Family Medicine

## 2014-03-04 ENCOUNTER — Ambulatory Visit (INDEPENDENT_AMBULATORY_CARE_PROVIDER_SITE_OTHER): Payer: BC Managed Care – PPO | Admitting: Family Medicine

## 2014-03-04 VITALS — BP 126/81 | HR 81 | Temp 97.7°F | Ht 69.5 in | Wt 329.6 lb

## 2014-03-04 DIAGNOSIS — E119 Type 2 diabetes mellitus without complications: Secondary | ICD-10-CM

## 2014-03-04 DIAGNOSIS — M109 Gout, unspecified: Secondary | ICD-10-CM

## 2014-03-04 DIAGNOSIS — M2140 Flat foot [pes planus] (acquired), unspecified foot: Secondary | ICD-10-CM

## 2014-03-04 DIAGNOSIS — M2141 Flat foot [pes planus] (acquired), right foot: Secondary | ICD-10-CM | POA: Insufficient documentation

## 2014-03-04 DIAGNOSIS — M2142 Flat foot [pes planus] (acquired), left foot: Secondary | ICD-10-CM

## 2014-03-04 DIAGNOSIS — E669 Obesity, unspecified: Secondary | ICD-10-CM

## 2014-03-04 DIAGNOSIS — E785 Hyperlipidemia, unspecified: Secondary | ICD-10-CM

## 2014-03-04 DIAGNOSIS — T7840XA Allergy, unspecified, initial encounter: Secondary | ICD-10-CM

## 2014-03-04 DIAGNOSIS — E559 Vitamin D deficiency, unspecified: Secondary | ICD-10-CM

## 2014-03-04 LAB — POCT GLYCOSYLATED HEMOGLOBIN (HGB A1C): Hemoglobin A1C: 5.4

## 2014-03-04 MED ORDER — FLUTICASONE PROPIONATE 50 MCG/ACT NA SUSP: 2.0000 | Freq: Every day | NASAL | Status: DC

## 2014-03-04 NOTE — Progress Notes (Signed)
Patient ID: Alex Harrison, male   DOB: September 16, 1971, 43 y.o.   MRN: [id number] SUBJECTIVE: CC: Chief Complaint  Patient presents with  . Follow-up    3 month follow up chroic problems  c/.o sinus problems     HPI: Patient is here for follow up of hyperlipidemia/obesity/gout/vit D def/DM: denies Headache;denies Chest Pain;denies weakness;denies Shortness of Breath and orthopnea;denies Visual changes;denies palpitations;denies cough;denies pedal edema;denies symptoms of TIA or stroke;deniesClaudication symptoms. admits to Compliance with medications; denies Problems with medications.  Sinus congestion. Has seasonla allergies.   Past Medical History  Diagnosis Date  . Gout   . Morbid obesity   . Hyperlipidemia   . Allergic rhinitis   . Neck nodule 09/16/10    right   . Left elbow pain   . Left arm pain   . Allergy     seasonal allergic rhinitis   No past surgical history on file. History   Social History  . Marital Status: Married    Spouse Name: N/A    Number of Children: N/A  . Years of Education: N/A   Occupational History  . Not on file.   Social History Main Topics  . Smoking status: Never Smoker   . Smokeless tobacco: Not on file  . Alcohol Use: Not on file  . Drug Use: Not on file  . Sexual Activity: Not on file   Other Topics Concern  . Not on file   Social History Narrative  . No narrative on file   No family history on file. Current Outpatient Prescriptions on File Prior to Visit  Medication Sig Dispense Refill  . allopurinol (ZYLOPRIM) 300 MG tablet Take 1 tablet (300 mg total) by mouth daily.  30 tablet  5  . Cholecalciferol (VITAMIN D-3) 5000 UNITS TABS Take by mouth.        Marland Kitchen lisinopril (PRINIVIL,ZESTRIL) 2.5 MG tablet Take 1 tablet (2.5 mg total) by mouth daily.  30 tablet  5  . metFORMIN (GLUCOPHAGE) 500 MG tablet Take 250 mg by mouth daily with breakfast.      . niacin (NIASPAN) 1000 MG CR tablet Take 1 tablet (1,000 mg total) by mouth at  bedtime.  30 tablet  5  . omega-3 acid ethyl esters (LOVAZA) 1 G capsule Take 2 g by mouth 2 (two) times daily.        . rosuvastatin (CRESTOR) 10 MG tablet Take 1 tablet (10 mg total) by mouth daily.  30 tablet  5   No current facility-administered medications on file prior to visit.   Allergies  Allergen Reactions  . Sulfa Antibiotics    Immunization History  Administered Date(s) Administered  . Influenza,inj,Quad PF,36+ Mos 11/06/2013  . Tdap 11/12/2009   Prior to Admission medications   Medication Sig Start Date End Date Taking? Authorizing Provider  allopurinol (ZYLOPRIM) 300 MG tablet Take 1 tablet (300 mg total) by mouth daily. 11/06/13  Yes Vernie Shanks, MD  Cholecalciferol (VITAMIN D-3) 5000 UNITS TABS Take by mouth.     Yes Historical Provider, MD  lisinopril (PRINIVIL,ZESTRIL) 2.5 MG tablet Take 1 tablet (2.5 mg total) by mouth daily. 11/06/13  Yes Vernie Shanks, MD  metFORMIN (GLUCOPHAGE) 500 MG tablet Take 250 mg by mouth daily with breakfast. 04/24/13  Yes Vernie Shanks, MD  niacin (NIASPAN) 1000 MG CR tablet Take 1 tablet (1,000 mg total) by mouth at bedtime. 04/24/13  Yes Vernie Shanks, MD  omega-3 acid ethyl esters (LOVAZA) 1 G capsule Take 2  g by mouth 2 (two) times daily.     Yes Historical Provider, MD  rosuvastatin (CRESTOR) 10 MG tablet Take 1 tablet (10 mg total) by mouth daily. 11/06/13  Yes Vernie Shanks, MD     ROS: As above in the HPI. All other systems are stable or negative.  OBJECTIVE: APPEARANCE:  Patient in no acute distress.The patient appeared well nourished and normally developed. Acyanotic. Waist: VITAL SIGNS:BP 126/81  Pulse 81  Temp(Src) 97.7 F (36.5 C) (Oral)  Ht 5' 9.5" (1.765 m)  Wt 329 lb 9.6 oz (149.506 kg)  BMI 47.99 kg/m2 Obese AAM Significant weight gain.   SKIN: warm and  Dry without overt rashes, tattoos and scars  HEAD and Neck: without JVD, Head and scalp: normal Eyes:No scleral icterus. Fundi normal, eye  movements normal. Ears: Auricle normal, canal normal, Tympanic membranes normal, insufflation normal. Nose: normal Throat: normal Neck & thyroid: normal  CHEST & LUNGS: Chest wall: normal Lungs: Clear  CVS: Reveals the PMI to be normally located. Regular rhythm, First and Second Heart sounds are normal,  absence of murmurs, rubs or gallops. Peripheral vasculature: Radial pulses: normal Dorsal pedis pulses: normal Posterior pulses: normal  ABDOMEN:  Appearance: morbid obesity. Benign, no organomegaly, no masses, no Abdominal Aortic enlargement. No Guarding , no rebound. No Bruits. Bowel sounds: normal  RECTAL: N/A GU: N/A  EXTREMETIES: nonedematous.  MUSCULOSKELETAL:  Spine: normal Joints: intact  NEUROLOGIC: oriented to time,place and person; nonfocal.   Results for orders placed in visit on 03/04/14  POCT GLYCOSYLATED HEMOGLOBIN (HGB A1C)      Result Value Ref Range   Hemoglobin A1C 5.4 %      ASSESSMENT:  Obesity, unspecified  HLD (hyperlipidemia) - Plan: CMP14+EGFR, Lipid panel  Gout - Plan: CMP14+EGFR, Uric acid  DM (diabetes mellitus) - Plan: POCT glycosylated hemoglobin (Hb A1C), CMP14+EGFR  Unspecified vitamin D deficiency - Plan: Vit D  25 hydroxy (rtn osteoporosis monitoring)  Allergy - Plan: fluticasone (FLONASE) 50 MCG/ACT nasal spray  Pes planus of both feet DM foot exam was good.   PLAN:  Counseled on weight reduction and getting back on track.      Dr Paula Libra Recommendations  For nutrition information, I recommend books:  1).Eat to Live by Dr Excell Seltzer. 2).Prevent and Reverse Heart Disease by Dr Karl Luke. 3) Dr Janene Harvey Book:  Program to Reverse Diabetes  Exercise recommendations are:  If unable to walk, then the patient can exercise in a chair 3 times a day. By flapping arms like a bird gently and raising legs outwards to the front.  If ambulatory, the patient can go for walks for 30 minutes 3 times a  week. Then increase the intensity and duration as tolerated.  Goal is to try to attain exercise frequency to 5 times a week.  If applicable: Best to perform resistance exercises (machines or weights) 2 days a week and cardio type exercises 3 days per week.  Purine restricted  Diet.  Handouts in the AVS.  Orders Placed This Encounter  Procedures  . CMP14+EGFR  . Lipid panel  . Uric acid  . Vit D  25 hydroxy (rtn osteoporosis monitoring)  . POCT glycosylated hemoglobin (Hb A1C)   Meds ordered this encounter  Medications  . fluticasone (FLONASE) 50 MCG/ACT nasal spray    Sig: Place 2 sprays into both nostrils daily.    Dispense:  16 g    Refill:  6   There are no discontinued medications.  Return in about 3 months (around 06/04/2014) for Recheck medical problems.  Ayomide Zuleta P. Jacelyn Grip, M.D.

## 2014-03-04 NOTE — Patient Instructions (Signed)
Diabetes and Foot Care Diabetes may cause you to have problems because of poor blood supply (circulation) to your feet and legs. This may cause the skin on your feet to become thinner, break easier, and heal more slowly. Your skin may become dry, and the skin may peel and crack. You may also have nerve damage in your legs and feet causing decreased feeling in them. You may not notice minor injuries to your feet that could lead to infections or more serious problems. Taking care of your feet is one of the most important things you can do for yourself.  HOME CARE INSTRUCTIONS  Wear shoes at all times, even in the house. Do not go barefoot. Bare feet are easily injured.  Check your feet daily for blisters, cuts, and redness. If you cannot see the bottom of your feet, use a mirror or ask someone for help.  Wash your feet with warm water (do not use hot water) and mild soap. Then pat your feet and the areas between your toes until they are completely dry. Do not soak your feet as this can dry your skin.  Apply a moisturizing lotion or petroleum jelly (that does not contain alcohol and is unscented) to the skin on your feet and to dry, brittle toenails. Do not apply lotion between your toes.  Trim your toenails straight across. Do not dig under them or around the cuticle. File the edges of your nails with an emery board or nail file.  Do not cut corns or calluses or try to remove them with medicine.  Wear clean socks or stockings every day. Make sure they are not too tight. Do not wear knee-high stockings since they may decrease blood flow to your legs.  Wear shoes that fit properly and have enough cushioning. To break in new shoes, wear them for just a few hours a day. This prevents you from injuring your feet. Always look in your shoes before you put them on to be sure there are no objects inside.  Do not cross your legs. This may decrease the blood flow to your feet.  If you find a minor scrape,  cut, or break in the skin on your feet, keep it and the skin around it clean and dry. These areas may be cleansed with mild soap and water. Do not cleanse the area with peroxide, alcohol, or iodine.  When you remove an adhesive bandage, be sure not to damage the skin around it.  If you have a wound, look at it several times a day to make sure it is healing.  Do not use heating pads or hot water bottles. They may burn your skin. If you have lost feeling in your feet or legs, you may not know it is happening until it is too late.  Make sure your health care provider performs a complete foot exam at least annually or more often if you have foot problems. Report any cuts, sores, or bruises to your health care provider immediately. SEEK MEDICAL CARE IF:   You have an injury that is not healing.  You have cuts or breaks in the skin.  You have an ingrown nail.  You notice redness on your legs or feet.  You feel burning or tingling in your legs or feet.  You have pain or cramps in your legs and feet.  Your legs or feet are numb.  Your feet always feel cold. SEEK IMMEDIATE MEDICAL CARE IF:   There is increasing redness,   swelling, or pain in or around a wound.  There is a red line that goes up your leg.  Pus is coming from a wound.  You develop a fever or as directed by your health care provider.  You notice a bad smell coming from an ulcer or wound. Document Released: 11/26/2000 Document Revised: 08/01/2013 Document Reviewed: 05/08/2013 [facility] Patient Information 2014 [place], [place].   DASH Diet The DASH diet stands for "Dietary Approaches to Stop Hypertension." It is a healthy eating plan that has been shown to reduce high blood pressure (hypertension) in as little as 14 days, while also possibly providing other significant health benefits. These other health benefits include reducing the risk of breast cancer after menopause and reducing the risk of type 2 diabetes, heart  disease, colon cancer, and stroke. Health benefits also include weight loss and slowing kidney failure in patients with chronic kidney disease.  DIET GUIDELINES  Limit salt (sodium). Your diet should contain less than 1500 mg of sodium daily.  Limit refined or processed carbohydrates. Your diet should include mostly whole grains. Desserts and added sugars should be used sparingly.  Include small amounts of heart-healthy fats. These types of fats include nuts, oils, and tub margarine. Limit saturated and trans fats. These fats have been shown to be harmful in the body. CHOOSING FOODS  The following food groups are based on a 2000 calorie diet. See your Registered Dietitian for individual calorie needs. Grains and Grain Products (6 to 8 servings daily)  Eat More Often: Whole-wheat bread, brown rice, whole-grain or wheat pasta, quinoa, popcorn without added fat or salt (air popped).  Eat Less Often: White bread, white pasta, white rice, cornbread. Vegetables (4 to 5 servings daily)  Eat More Often: Fresh, frozen, and canned vegetables. Vegetables may be raw, steamed, roasted, or grilled with a minimal amount of fat.  Eat Less Often/Avoid: Creamed or fried vegetables. Vegetables in a cheese sauce. Fruit (4 to 5 servings daily)  Eat More Often: All fresh, canned (in natural juice), or frozen fruits. Dried fruits without added sugar. One hundred percent fruit juice ( cup [237 mL] daily).  Eat Less Often: Dried fruits with added sugar. Canned fruit in light or heavy syrup. Foot LockerLean Meats, Fish, and Poultry (2 servings or less daily. One serving is 3 to 4 oz [85-114 g]).  Eat More Often: Ninety percent or leaner ground beef, tenderloin, sirloin. Round cuts of beef, chicken breast, Mal[place]urkey breast. All fish. Grill, bake, or broil your meat. Nothing should be fried.  Eat Less Often/Avoid: Fatty cuts of meat, [place]urkey, or chicken leg, thigh, or wing. Fried cuts of meat or fish. Dairy (2 to 3  servings)  Eat More Often: Low-fat or fat-free milk, low-fat plain or light yogurt, reduced-fat or part-skim cheese.  Eat Less Often/Avoid: Milk (whole, 2%).Whole milk yogurt. Full-fat cheeses. Nuts, Seeds, and Legumes (4 to 5 servings per week)  Eat More Often: All without added salt.  Eat Less Often/Avoid: Salted nuts and seeds, canned beans with added salt. Fats and Sweets (limited)  Eat More Often: Vegetable oils, tub margarines without trans fats, sugar-free gelatin. Mayonnaise and salad dressings.  Eat Less Often/Avoid: Coconut oils, palm oils, butter, stick margarine, cream, half and half, cookies, candy, pie. FOR MORE INFORMATION The Dash Diet Eating Plan: www.dashdiet.org Document Released: 11/18/2011 Document Revised: 02/21/2012 Document Reviewed: 11/18/2011 [facility]ExitCare Patient Information 2014 [place], [place].   Purine Restricted Diet A low-purine diet consists of foods that reduce uric acid made in your body.  INDICATIONS FOR USE  Your caregiver may ask you to follow a low-purine diet to reduce gout flairs.  GUIDELINES  Avoid high-purine foods, including all alcohol, yeast extracts taken as supplements, and sauces made from meats (like gravy). Do not eat high-purine meats, including anchovies, sardines, herring, mussels, tuna, codfish, scallops, trout, haddock, bacon, organ meats, tripe, goose, wild game, and sweetbreads.  Grains  Allowed/Recommended: All, except those listed to consume in moderation.  Consume in Moderation: Oatmeal ( cup uncooked daily), wheat bran or germ ( cup daily), and whole grains. Vegetables  Allowed/Recommended: All, except those listed to consume in moderation.  Consume in Moderation: Asparagus, cauliflower, spinach, mushrooms, and green peas ( cup daily). Fruit  Allowed/Recommended: All.  Consume in Moderation: None. Meat and Meat Substitutes  Allowed/Recommended: Eggs, nuts, and peanut butter.  Consume in Moderation: Limit to 4 to  6 oz daily. Avoid high-purine meats. Lentils, peas, and dried beans (1 cup daily). Milk  Allowed/Recommended: All. Choose low-fat or skim when possible.  Consume in Moderation: None. Fats and Oils  Allowed/Recommended: All.  Consume in Moderation: None. Beverages  Allowed/Recommended: All, except those listed to avoid.  Avoid: All alcohol. Condiments/Miscellaneous  Allowed/Recommended: All, except those listed to consume in moderation.  Consume in Moderation: Bouillon and meat-based broths and soups. Document Released: 03/26/2011 Document Revised: 02/21/2012 Document Reviewed: 03/26/2011 [facility] Patient Information 2014 [place], [place].        Dr Woodroe Mode Recommendations  For nutrition information, I recommend books:  1).Eat to Live by Dr Monico Hoar. 2).Prevent and Reverse Heart Disease by Dr Suzzette Righter. 3) Dr Katherina Right Book:  Program to Reverse Diabetes  Exercise recommendations are:  If unable to walk, then the patient can exercise in a chair 3 times a day. By flapping arms like a bird gently and raising legs outwards to the front.  If ambulatory, the patient can go for walks for 30 minutes 3 times a week. Then increase the intensity and duration as tolerated.  Goal is to try to attain exercise frequency to 5 times a week.  If applicable: Best to perform resistance exercises (machines or weights) 2 days a week and cardio type exercises 3 days per week.

## 2014-03-05 LAB — CMP14+EGFR
ALT: 23 IU/L (ref 0–44)
AST: 23 IU/L (ref 0–40)
Albumin/Globulin Ratio: 1.8 (ref 1.1–2.5)
Albumin: 4.4 g/dL (ref 3.5–5.5)
Alkaline Phosphatase: 80 IU/L (ref 39–117)
BUN/Creatinine Ratio: 9 (ref 9–20)
BUN: 9 mg/dL (ref 6–24)
CO2: 23 mmol/L (ref 18–29)
Calcium: 9.3 mg/dL (ref 8.7–10.2)
Chloride: 99 mmol/L (ref 97–108)
Creatinine, Ser: 1 mg/dL (ref 0.76–1.27)
GFR calc Af Amer: 105 mL/min/{1.73_m2} (ref >59)
GFR calc non Af Amer: 91 mL/min/{1.73_m2} (ref >59)
Globulin, Total: 2.4 g/dL (ref 1.5–4.5)
Glucose: 75 mg/dL (ref 65–99)
Potassium: 4.2 mmol/L (ref 3.5–5.2)
Sodium: 141 mmol/L (ref 134–144)
Total Bilirubin: 1 mg/dL (ref 0.0–1.2)
Total Protein: 6.8 g/dL (ref 6.0–8.5)

## 2014-03-05 LAB — VITAMIN D 25 HYDROXY (VIT D DEFICIENCY, FRACTURES): Vit D, 25-Hydroxy: 33.1 ng/mL (ref 30.0–100.0)

## 2014-03-05 LAB — LIPID PANEL
Chol/HDL Ratio: 3 ratio units (ref 0.0–5.0)
Cholesterol, Total: 138 mg/dL (ref 100–199)
HDL: 46 mg/dL (ref >39)
LDL Calculated: 76 mg/dL (ref 0–99)
Triglycerides: 81 mg/dL (ref 0–149)
VLDL Cholesterol Cal: 16 mg/dL (ref 5–40)

## 2014-03-05 LAB — URIC ACID: Uric Acid: 4.8 mg/dL (ref 3.7–8.6)

## 2014-03-23 ENCOUNTER — Other Ambulatory Visit: Payer: Self-pay | Admitting: Family Medicine

## 2014-06-19 ENCOUNTER — Ambulatory Visit (INDEPENDENT_AMBULATORY_CARE_PROVIDER_SITE_OTHER): Payer: BC Managed Care – PPO | Admitting: Family Medicine

## 2014-06-19 ENCOUNTER — Encounter: Payer: Self-pay | Admitting: Family Medicine

## 2014-06-19 VITALS — BP 109/72 | HR 69 | Temp 96.9°F | Ht 69.5 in | Wt 329.4 lb

## 2014-06-19 DIAGNOSIS — M109 Gout, unspecified: Secondary | ICD-10-CM

## 2014-06-19 DIAGNOSIS — M10259 Drug-induced gout, unspecified hip: Secondary | ICD-10-CM

## 2014-06-19 DIAGNOSIS — E119 Type 2 diabetes mellitus without complications: Secondary | ICD-10-CM

## 2014-06-19 LAB — POCT CBC
Granulocyte percent: 46.7 %G (ref 37–80)
HCT, POC: 43.6 % (ref 43.5–53.7)
Hemoglobin: 14.1 g/dL (ref 14.1–18.1)
Lymph, poc: 2.8 (ref 0.6–3.4)
MCH, POC: 29 pg (ref 27–31.2)
MCHC: 32.3 g/dL (ref 31.8–35.4)
MCV: 89.7 fL (ref 80–97)
MPV: 6.1 fL (ref 0–99.8)
POC Granulocyte: 2.8 (ref 2–6.9)
POC LYMPH PERCENT: 45.5 %L (ref 10–50)
Platelet Count, POC: 309 10*3/uL (ref 142–424)
RBC: 4.9 M/uL (ref 4.69–6.13)
RDW, POC: 13.4 %
WBC: 6.1 10*3/uL (ref 4.6–10.2)

## 2014-06-19 LAB — POCT GLYCOSYLATED HEMOGLOBIN (HGB A1C): Hemoglobin A1C: 5.7

## 2014-06-19 NOTE — Progress Notes (Signed)
   Subjective:    Patient ID: Alex Harrison, male    DOB: 1971/12/06, 43 y.o.   MRN: [id number]  HPI This 43 y.o. male presents for evaluation of routine follow up.  He has hx of diabetes and obesity. He denies any acute medical problems.  He has hx of gout and is on prophylactic medication allopurinol and does not report any gout flares.  He has HLD and takes crestor.  He denies any acute medical problems.   Review of Systems No chest pain, SOB, HA, dizziness, vision change, N/V, diarrhea, constipation, dysuria, urinary urgency or frequency, myalgias, arthralgias or rash.     Objective:   Physical Exam Vital signs noted  Well developed well nourished male.  HEENT - Head atraumatic Normocephalic                Eyes - PERRLA, Conjuctiva - clear Sclera- Clear EOMI                Ears - EAC's Wnl TM's Wnl Gross Hearing WNL                Nose - Nares patent                 Throat - oropharanx wnl Respiratory - Lungs CTA bilateral Cardiac - RRR S1 and S2 without murmur GI - Abdomen soft Nontender and bowel sounds active x 4 Extremities - No edema. Neuro - Grossly intact.       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  Follow up in 3 months  Lysbeth Penner FNP

## 2014-06-20 LAB — CMP14+EGFR
ALT: 29 IU/L (ref 0–44)
AST: 24 IU/L (ref 0–40)
Albumin/Globulin Ratio: 1.7 (ref 1.1–2.5)
Albumin: 4.3 g/dL (ref 3.5–5.5)
Alkaline Phosphatase: 72 IU/L (ref 39–117)
BUN/Creatinine Ratio: 10 (ref 9–20)
BUN: 10 mg/dL (ref 6–24)
CO2: 27 mmol/L (ref 18–29)
Calcium: 9.9 mg/dL (ref 8.7–10.2)
Chloride: 100 mmol/L (ref 97–108)
Creatinine, Ser: 0.96 mg/dL (ref 0.76–1.27)
GFR calc Af Amer: 111 mL/min/{1.73_m2} (ref >59)
GFR calc non Af Amer: 96 mL/min/{1.73_m2} (ref >59)
Globulin, Total: 2.6 g/dL (ref 1.5–4.5)
Glucose: 92 mg/dL (ref 65–99)
Potassium: 4.4 mmol/L (ref 3.5–5.2)
Sodium: 140 mmol/L (ref 134–144)
Total Bilirubin: 0.9 mg/dL (ref 0.0–1.2)
Total Protein: 6.9 g/dL (ref 6.0–8.5)

## 2014-06-20 LAB — PSA, TOTAL AND FREE
PSA, Free Pct: 60 %
PSA, Free: 0.42 ng/mL
PSA: 0.7 ng/mL (ref 0.0–4.0)

## 2014-06-20 LAB — LIPID PANEL
Chol/HDL Ratio: 5 ratio units (ref 0.0–5.0)
Cholesterol, Total: 206 mg/dL — ABNORMAL HIGH (ref 100–199)
HDL: 41 mg/dL (ref >39)
LDL Calculated: 142 mg/dL — ABNORMAL HIGH (ref 0–99)
Triglycerides: 115 mg/dL (ref 0–149)
VLDL Cholesterol Cal: 23 mg/dL (ref 5–40)

## 2014-06-20 LAB — URIC ACID: Uric Acid: 5.7 mg/dL (ref 3.7–8.6)

## 2014-06-20 LAB — THYROID PANEL WITH TSH
Free Thyroxine Index: 1.7 (ref 1.2–4.9)
T3 Uptake Ratio: 29 % (ref 24–39)
T4, Total: 6 ug/dL (ref 4.5–12.0)
TSH: 1.38 u[IU]/mL (ref 0.450–4.500)

## 2014-07-18 ENCOUNTER — Ambulatory Visit (INDEPENDENT_AMBULATORY_CARE_PROVIDER_SITE_OTHER): Payer: BC Managed Care – PPO

## 2014-07-18 ENCOUNTER — Encounter: Payer: Self-pay | Admitting: Nurse Practitioner

## 2014-07-18 ENCOUNTER — Ambulatory Visit (INDEPENDENT_AMBULATORY_CARE_PROVIDER_SITE_OTHER): Payer: BC Managed Care – PPO | Admitting: Nurse Practitioner

## 2014-07-18 VITALS — BP 129/78 | HR 74 | Temp 98.2°F | Ht 69.5 in | Wt 335.6 lb

## 2014-07-18 DIAGNOSIS — R109 Unspecified abdominal pain: Secondary | ICD-10-CM

## 2014-07-18 DIAGNOSIS — R319 Hematuria, unspecified: Secondary | ICD-10-CM

## 2014-07-18 DIAGNOSIS — I10 Essential (primary) hypertension: Secondary | ICD-10-CM | POA: Insufficient documentation

## 2014-07-18 DIAGNOSIS — N200 Calculus of kidney: Secondary | ICD-10-CM

## 2014-07-18 LAB — POCT CBC
Granulocyte percent: 74.4 %G (ref 37–80)
HEMATOCRIT: 39.6 % — AB (ref 43.5–53.7)
Hemoglobin: 13 g/dL — AB (ref 14.1–18.1)
LYMPH, POC: 2.1 (ref 0.6–3.4)
MCH: 30.2 pg (ref 27–31.2)
MCHC: 32.9 g/dL (ref 31.8–35.4)
MCV: 91.9 fL (ref 80–97)
MPV: 6 fL (ref 0–99.8)
POC Granulocyte: 7.4 — AB (ref 2–6.9)
POC LYMPH PERCENT: 21.2 %L (ref 10–50)
Platelet Count, POC: 288 10*3/uL (ref 142–424)
RBC: 4.3 M/uL — AB (ref 4.69–6.13)
RDW, POC: 13.6 %
WBC: 9.9 10*3/uL (ref 4.6–10.2)

## 2014-07-18 LAB — POCT URINALYSIS DIPSTICK
Bilirubin, UA: NEGATIVE
GLUCOSE UA: NEGATIVE
Ketones, UA: NEGATIVE
Leukocytes, UA: NEGATIVE
NITRITE UA: NEGATIVE
PROTEIN UA: NEGATIVE
SPEC GRAV UA: 1.01
UROBILINOGEN UA: NEGATIVE
pH, UA: 7.5

## 2014-07-18 LAB — POCT UA - MICROSCOPIC ONLY
Bacteria, U Microscopic: NEGATIVE
CASTS, UR, LPF, POC: NEGATIVE
CRYSTALS, UR, HPF, POC: NEGATIVE
Mucus, UA: NEGATIVE
WBC, Ur, HPF, POC: NEGATIVE
YEAST UA: NEGATIVE

## 2014-07-18 MED ORDER — ONDANSETRON HCL 4 MG PO TABS: 4.0000 mg | ORAL_TABLET | Freq: Three times a day (TID) | ORAL | Status: DC | PRN

## 2014-07-18 MED ORDER — HYDROCODONE-ACETAMINOPHEN 5-325 MG PO TABS: 1.0000 | ORAL_TABLET | Freq: Four times a day (QID) | ORAL | Status: DC | PRN

## 2014-07-18 MED ORDER — KETOROLAC TROMETHAMINE 60 MG/2ML IM SOLN
60.0000 mg | Freq: Once | INTRAMUSCULAR | Status: AC
  Administered 2014-07-18: 60 mg via INTRAMUSCULAR

## 2014-07-18 NOTE — Patient Instructions (Signed)

## 2014-07-18 NOTE — Progress Notes (Signed)
Subjective:    Patient ID: Alex Harrison, male    DOB: 05-17-1971, 43 y.o.   MRN: [id number]  HPI Patient in today c/o right flank pain that woke him up around 2 am this morning- rates pain a 10/10. It eased a littele around 7 am then started back again about 15 minutes later- c/o going form hot to cold- vomited X2.    Review of Systems  Constitutional: Negative.   HENT: Negative.   Respiratory: Negative.   Cardiovascular: Negative.   Gastrointestinal: Positive for nausea, vomiting and abdominal pain.  Genitourinary: Positive for frequency and hematuria.  Musculoskeletal: Negative.   Neurological: Negative.   Psychiatric/Behavioral: Negative.   All other systems reviewed and are negative.      Objective:   Physical Exam  Constitutional: He is oriented to person, place, and time. He appears well-developed and well-nourished.  Cardiovascular: Normal rate and normal heart sounds.   Pulmonary/Chest: Effort normal and breath sounds normal.  Abdominal: Soft. Bowel sounds are normal. He exhibits no distension. There is no tenderness.  Neurological: He is alert and oriented to person, place, and time.  Skin: Skin is warm and dry.  Psychiatric: He has a normal mood and affect. His behavior is normal. Judgment and thought content normal.   BP 129/78  Pulse 74  Temp(Src) 98.2 F (36.8 C) (Oral)  Ht 5' 9.5" (1.765 m)  Wt 335 lb 9.6 oz (152.227 kg)  BMI 48.87 kg/m2  Results for orders placed in visit on 07/18/14  POCT UA - MICROSCOPIC ONLY      Result Value Ref Range   WBC, Ur, HPF, POC neg     RBC, urine, microscopic 30-40     Bacteria, U Microscopic neg     Mucus, UA neg     Epithelial cells, urine per micros occ     Crystals, Ur, HPF, POC neg     Casts, Ur, LPF, POC neg     Yeast, UA neg    POCT URINALYSIS DIPSTICK      Result Value Ref Range   Color, UA yellow     Clarity, UA cloudy     Glucose, UA neg     Bilirubin, UA neg     Ketones, UA neg     Spec Grav, UA  1.010     Blood, UA large     pH, UA 7.5     Protein, UA neg     Urobilinogen, UA negative     Nitrite, UA neg     Leukocytes, UA Negative    POCT CBC      Result Value Ref Range   WBC 9.9  4.6 - 10.2 K/uL   Lymph, poc 2.1  0.6 - 3.4   POC LYMPH PERCENT 21.2  10 - 50 %L   POC Granulocyte 7.4 (*) 2 - 6.9   Granulocyte percent 74.4  37 - 80 %G   RBC 4.3 (*) 4.69 - 6.13 M/uL   Hemoglobin 13.0 (*) 14.1 - 18.1 g/dL   HCT, POC 40.9 (*) 81.1 - 53.7 %   MCV 91.9  80 - 97 fL   MCH, POC 30.2  27 - 31.2 pg   MCHC 32.9  31.8 - 35.4 g/dL   RDW, POC 91.4     Platelet Count, POC 288.0  142 - 424 K/uL   MPV 6.0  0 - 99.8 fL   KUB- possible right nephrolithiasis-Preliminary reading by Paulene Floor, FNP  [facility]  Assessment & Plan:   1. Flank pain   2. Hematuria   3. Right kidney stone    Meds ordered this encounter  Medications  . ketorolac (TORADOL) injection 60 mg    Sig:   . HYDROcodone-acetaminophen (NORCO/VICODIN) 5-325 MG per tablet    Sig: Take 1 tablet by mouth every 6 (six) hours as needed for moderate pain.    Dispense:  30 tablet    Refill:  0    Order Specific Question:  Supervising Provider    Answer:  Ernestina PennaMOORE, DONALD W [[id number]]  . ondansetron (ZOFRAN) 4 MG tablet    Sig: Take 1 tablet (4 mg total) by mouth every 8 (eight) hours as needed for nausea or vomiting.    Dispense:  20 tablet    Refill:  0    Order Specific Question:  Supervising Provider    Answer:  Ernestina PennaMOORE, DONALD W [[id number]]    Force fluids Strain urine Pain meds as needed If no improvement will do urology referal  Mary-Margaret Daphine DeutscherMartin, FNP

## 2014-08-23 ENCOUNTER — Encounter: Payer: Self-pay | Admitting: *Deleted

## 2014-08-26 ENCOUNTER — Other Ambulatory Visit: Payer: Self-pay | Admitting: *Deleted

## 2014-08-26 MED ORDER — LISINOPRIL 2.5 MG PO TABS: 2.5000 mg | ORAL_TABLET | Freq: Every day | ORAL | Status: DC

## 2014-08-26 MED ORDER — ALLOPURINOL 300 MG PO TABS: 300.0000 mg | ORAL_TABLET | Freq: Every day | ORAL | Status: DC

## 2014-09-19 ENCOUNTER — Ambulatory Visit: Payer: BC Managed Care – PPO | Admitting: Family Medicine

## 2014-09-25 ENCOUNTER — Encounter: Payer: Self-pay | Admitting: Family Medicine

## 2014-09-25 ENCOUNTER — Ambulatory Visit (INDEPENDENT_AMBULATORY_CARE_PROVIDER_SITE_OTHER): Payer: BC Managed Care – PPO | Admitting: Family Medicine

## 2014-09-25 VITALS — BP 120/76 | HR 78 | Temp 97.0°F | Ht 69.5 in | Wt 342.0 lb

## 2014-09-25 DIAGNOSIS — I10 Essential (primary) hypertension: Secondary | ICD-10-CM

## 2014-09-25 DIAGNOSIS — Z23 Encounter for immunization: Secondary | ICD-10-CM

## 2014-09-25 DIAGNOSIS — E08339 Diabetes mellitus due to underlying condition with moderate nonproliferative diabetic retinopathy without macular edema: Secondary | ICD-10-CM

## 2014-09-25 DIAGNOSIS — E785 Hyperlipidemia, unspecified: Secondary | ICD-10-CM

## 2014-09-25 DIAGNOSIS — M10031 Idiopathic gout, right wrist: Secondary | ICD-10-CM

## 2014-09-25 LAB — POCT CBC
Granulocyte percent: 47 %G (ref 37–80)
HCT, POC: 41.1 % — AB (ref 43.5–53.7)
Hemoglobin: 13.3 g/dL — AB (ref 14.1–18.1)
Lymph, poc: 3 (ref 0.6–3.4)
MCH, POC: 28.9 pg (ref 27–31.2)
MCHC: 32.4 g/dL (ref 31.8–35.4)
MCV: 89.1 fL (ref 80–97)
MPV: 6 fL (ref 0–99.8)
POC Granulocyte: 3 (ref 2–6.9)
POC LYMPH PERCENT: 47 %L (ref 10–50)
Platelet Count, POC: 288 10*3/uL (ref 142–424)
RBC: 4.6 M/uL — AB (ref 4.69–6.13)
RDW, POC: 13.6 %
WBC: 6.4 10*3/uL (ref 4.6–10.2)

## 2014-09-25 LAB — POCT GLYCOSYLATED HEMOGLOBIN (HGB A1C): Hemoglobin A1C: 5.4

## 2014-09-25 MED ORDER — LISINOPRIL 2.5 MG PO TABS
2.5000 mg | ORAL_TABLET | Freq: Every day | ORAL | Status: DC
Start: 2014-09-25 — End: 2015-04-08

## 2014-09-25 MED ORDER — ALLOPURINOL 300 MG PO TABS: 300.0000 mg | ORAL_TABLET | Freq: Every day | ORAL | Status: DC

## 2014-09-25 MED ORDER — ROSUVASTATIN CALCIUM 10 MG PO TABS: 10.0000 mg | ORAL_TABLET | Freq: Every day | ORAL | Status: DC

## 2014-09-25 NOTE — Progress Notes (Signed)
Subjective:    Patient ID: Alex Harrison, male    DOB: 01/11/1971, 43 y.o.   MRN: [id number]  HPI This 43 y.o. male presents for evaluation of routine follow up.  He has hx of htn, dm, and hyperlipidemia.  He has no acute complaints.  He is off his diabetes medicine and is exercising.   Review of Systems    No chest pain, SOB, HA, dizziness, vision change, N/V, diarrhea, constipation, dysuria, urinary urgency or frequency, myalgias, arthralgias or rash.  Objective:   Physical Exam  Vital signs noted  Well developed well nourished male.  HEENT - Head atraumatic Normocephalic                Eyes - PERRLA, Conjuctiva - clear Sclera- Clear EOMI                Ears - EAC's Wnl TM's Wnl Gross Hearing WNL                Nose - Nares patent                 Throat - oropharanx wnl Respiratory - Lungs CTA bilateral Cardiac - RRR S1 and S2 without murmur GI - Abdomen soft Nontender and bowel sounds active x 4 Extremities - No edema. Neuro - Grossly intact.      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 He is seeing Eye doctor and is still exercising.  Acute idiopathic gout of right wrist - Plan: Uric acid, allopurinol (ZYLOPRIM) 300 MG tablet.  Essential hypertension - Plan: lisinopril (PRINIVIL,ZESTRIL) 2.5 MG tablet.  HTN controlled.  Follow up in 3-4 months  William J Oxford FNP 

## 2014-09-26 LAB — LIPID PANEL
Chol/HDL Ratio: 4.3 ratio units (ref 0.0–5.0)
Cholesterol, Total: 182 mg/dL (ref 100–199)
HDL: 42 mg/dL (ref >39)
LDL Calculated: 124 mg/dL — ABNORMAL HIGH (ref 0–99)
Triglycerides: 80 mg/dL (ref 0–149)
VLDL Cholesterol Cal: 16 mg/dL (ref 5–40)

## 2014-09-26 LAB — CMP14+EGFR
ALT: 33 IU/L (ref 0–44)
AST: 27 IU/L (ref 0–40)
Albumin/Globulin Ratio: 1.5 (ref 1.1–2.5)
Albumin: 4.3 g/dL (ref 3.5–5.5)
Alkaline Phosphatase: 73 IU/L (ref 39–117)
BUN/Creatinine Ratio: 9 (ref 9–20)
BUN: 9 mg/dL (ref 6–24)
CO2: 25 mmol/L (ref 18–29)
Calcium: 9.4 mg/dL (ref 8.7–10.2)
Chloride: 101 mmol/L (ref 97–108)
Creatinine, Ser: 1 mg/dL (ref 0.76–1.27)
GFR calc Af Amer: 105 mL/min/{1.73_m2} (ref >59)
GFR calc non Af Amer: 91 mL/min/{1.73_m2} (ref >59)
Globulin, Total: 2.9 g/dL (ref 1.5–4.5)
Glucose: 88 mg/dL (ref 65–99)
Potassium: 4.3 mmol/L (ref 3.5–5.2)
Sodium: 143 mmol/L (ref 134–144)
Total Bilirubin: 0.7 mg/dL (ref 0.0–1.2)
Total Protein: 7.2 g/dL (ref 6.0–8.5)

## 2014-09-26 LAB — URIC ACID: Uric Acid: 6.7 mg/dL (ref 3.7–8.6)

## 2014-10-29 ENCOUNTER — Encounter: Payer: Self-pay | Admitting: *Deleted

## 2015-01-04 ENCOUNTER — Emergency Department (HOSPITAL_COMMUNITY)
Admission: EM | Admit: 2015-01-04 | Discharge: 2015-01-05 | Disposition: A | Discharge status: Home / Self Care | Payer: BC Managed Care – PPO | Attending: Emergency Medicine | Admitting: Emergency Medicine

## 2015-01-04 ENCOUNTER — Encounter (HOSPITAL_COMMUNITY): Payer: Self-pay

## 2015-01-04 ENCOUNTER — Emergency Department (HOSPITAL_COMMUNITY): Payer: BC Managed Care – PPO

## 2015-01-04 DIAGNOSIS — E119 Type 2 diabetes mellitus without complications: Secondary | ICD-10-CM | POA: Insufficient documentation

## 2015-01-04 DIAGNOSIS — I10 Essential (primary) hypertension: Secondary | ICD-10-CM | POA: Diagnosis not present

## 2015-01-04 DIAGNOSIS — E785 Hyperlipidemia, unspecified: Secondary | ICD-10-CM | POA: Insufficient documentation

## 2015-01-04 DIAGNOSIS — N200 Calculus of kidney: Secondary | ICD-10-CM | POA: Diagnosis not present

## 2015-01-04 DIAGNOSIS — M109 Gout, unspecified: Secondary | ICD-10-CM | POA: Diagnosis not present

## 2015-01-04 DIAGNOSIS — Z79899 Other long term (current) drug therapy: Secondary | ICD-10-CM | POA: Diagnosis not present

## 2015-01-04 DIAGNOSIS — R109 Unspecified abdominal pain: Secondary | ICD-10-CM

## 2015-01-04 DIAGNOSIS — R300 Dysuria: Secondary | ICD-10-CM | POA: Diagnosis present

## 2015-01-04 HISTORY — DX: Type 2 diabetes mellitus without complications: E11.9

## 2015-01-04 LAB — URINALYSIS, ROUTINE W REFLEX MICROSCOPIC
BILIRUBIN URINE: NEGATIVE
Glucose, UA: NEGATIVE mg/dL
KETONES UR: NEGATIVE mg/dL
Leukocytes, UA: NEGATIVE
Nitrite: NEGATIVE
PROTEIN: NEGATIVE mg/dL
Specific Gravity, Urine: 1.02 (ref 1.005–1.030)
Urobilinogen, UA: 0.2 mg/dL (ref 0.0–1.0)
pH: 6 (ref 5.0–8.0)

## 2015-01-04 LAB — BASIC METABOLIC PANEL
Anion gap: 6 (ref 5–15)
BUN: 16 mg/dL (ref 6–23)
CO2: 24 mmol/L (ref 19–32)
Calcium: 8.8 mg/dL (ref 8.4–10.5)
Chloride: 108 mmol/L (ref 96–112)
Creatinine, Ser: 1.65 mg/dL — ABNORMAL HIGH (ref 0.50–1.35)
GFR, EST AFRICAN AMERICAN: 56 mL/min — AB (ref >90)
GFR, EST NON AFRICAN AMERICAN: 49 mL/min — AB (ref >90)
Glucose, Bld: 103 mg/dL — ABNORMAL HIGH (ref 70–99)
POTASSIUM: 3.7 mmol/L (ref 3.5–5.1)
Sodium: 138 mmol/L (ref 135–145)

## 2015-01-04 LAB — CBC WITH DIFFERENTIAL/PLATELET
BASOS ABS: 0 10*3/uL (ref 0.0–0.1)
Basophils Relative: 0 % (ref 0–1)
Eosinophils Absolute: 0.1 10*3/uL (ref 0.0–0.7)
Eosinophils Relative: 1 % (ref 0–5)
HCT: 41.9 % (ref 39.0–52.0)
Hemoglobin: 13.9 g/dL (ref 13.0–17.0)
LYMPHS ABS: 2.8 10*3/uL (ref 0.7–4.0)
LYMPHS PCT: 33 % (ref 12–46)
MCH: 30.1 pg (ref 26.0–34.0)
MCHC: 33.2 g/dL (ref 30.0–36.0)
MCV: 90.7 fL (ref 78.0–100.0)
MONO ABS: 0.6 10*3/uL (ref 0.1–1.0)
MONOS PCT: 7 % (ref 3–12)
NEUTROS ABS: 5 10*3/uL (ref 1.7–7.7)
NEUTROS PCT: 59 % (ref 43–77)
PLATELETS: 244 10*3/uL (ref 150–400)
RBC: 4.62 MIL/uL (ref 4.22–5.81)
RDW: 12.9 % (ref 11.5–15.5)
WBC: 8.5 10*3/uL (ref 4.0–10.5)

## 2015-01-04 LAB — URINE MICROSCOPIC-ADD ON

## 2015-01-04 MED ORDER — KETOROLAC TROMETHAMINE 30 MG/ML IJ SOLN
30.0000 mg | Freq: Once | INTRAMUSCULAR | Status: AC
  Administered 2015-01-04: 30 mg via INTRAVENOUS
  Filled 2015-01-04: qty 1

## 2015-01-04 MED ORDER — ONDANSETRON HCL 4 MG/2ML IJ SOLN
4.0000 mg | Freq: Once | INTRAMUSCULAR | Status: AC
  Administered 2015-01-04: 4 mg via INTRAVENOUS
  Filled 2015-01-04: qty 2

## 2015-01-04 MED ORDER — SODIUM CHLORIDE 0.9 % IV BOLUS (SEPSIS)
1000.0000 mL | Freq: Once | INTRAVENOUS | Status: AC
  Administered 2015-01-04: 1000 mL via INTRAVENOUS

## 2015-01-04 NOTE — ED Notes (Signed)
Lower back pain, that is shooting into my left side. Started hurting on Friday and I took a pain pill and it went away. Started hurting again today when I was pushing snow. Patient states that he has a history of kidney stones. Feels like the same pain that I had with kidney stone. Feels like I need to use the bathroom and I can't, not urinating like I should per pt.

## 2015-01-04 NOTE — ED Provider Notes (Signed)
TIME SEEN: 10:50 PM  CHIEF COMPLAINT: Left flank pain  HPI: Pt is a 44 y.o. morbidly obese male with history of prior kidney stones who presents emergency department with left flank pain that started yesterday evening. States that it was worse after shoveling some snow today. States he feels like he cannot urinate like he should but denies urinary retention. Denies fevers, chills. Has had nausea but no vomiting. No diarrhea. Pain does radiate into his abdomen. It is not worse with movement or palpation. No alleviating factors. Denies bowel or bladder incontinence. No numbness, tingling or focal weakness. No direct injury to his back. States that this feels similar to his prior kidney stones. No dysuria or hematuria.  ROS: See HPI Constitutional: no fever  Eyes: no drainage  ENT: no runny nose   Cardiovascular:  no chest pain  Resp: no SOB  GI: no vomiting GU: no dysuria Integumentary: no rash  Allergy: no hives  Musculoskeletal: no leg swelling  Neurological: no slurred speech ROS otherwise negative  PAST MEDICAL HISTORY/PAST SURGICAL HISTORY:  Past Medical History  Diagnosis Date  . Gout   . Morbid obesity   . Hyperlipidemia   . Allergic rhinitis   . Neck nodule 09/16/10    right   . Left elbow pain   . Left arm pain   . Allergy     seasonal allergic rhinitis  . Hypertension   . Diabetes mellitus without complication     MEDICATIONS:  Prior to Admission medications   Medication Sig Start Date End Date Taking? Authorizing Provider  allopurinol (ZYLOPRIM) 300 MG tablet Take 1 tablet (300 mg total) by mouth daily. 09/25/14  Yes Deatra CanterWilliam J Oxford, FNP  Cholecalciferol (VITAMIN D-3) 5000 UNITS TABS Take by mouth.     Yes Historical Provider, MD  HYDROcodone-acetaminophen (NORCO/VICODIN) 5-325 MG per tablet Take 1 tablet by mouth every 6 (six) hours as needed for moderate pain. 07/18/14  Yes Mary-Margaret Daphine DeutscherMartin, FNP  lisinopril (PRINIVIL,ZESTRIL) 2.5 MG tablet Take 1 tablet (2.5 mg  total) by mouth daily. 09/25/14  Yes Deatra CanterWilliam J Oxford, FNP  rosuvastatin (CRESTOR) 10 MG tablet Take 1 tablet (10 mg total) by mouth daily. 09/25/14  Yes Deatra CanterWilliam J Oxford, FNP    ALLERGIES:  Allergies  Allergen Reactions  . Sulfa Antibiotics     SOCIAL HISTORY:  History  Substance Use Topics  . Smoking status: Never Smoker   . Smokeless tobacco: Never Used  . Alcohol Use: No    FAMILY HISTORY: Family History  Problem Relation Age of Onset  . Hypertension Mother   . Diabetes Mother   . Stroke Mother   . Hypertension Father   . Diabetes Father   . Diabetes Sister   . Hypertension Sister     EXAM: BP 134/79 mmHg  Pulse 89  Temp(Src) 98.3 F (36.8 C) (Oral)  Resp 16  Ht 5\' 7"  (1.702 m)  Wt 350 lb (158.759 kg)  BMI 54.80 kg/m2  SpO2 98% CONSTITUTIONAL: Alert and oriented and responds appropriately to questions. Well-appearing; well-nourished HEAD: Normocephalic EYES: Conjunctivae clear, PERRL ENT: normal nose; no rhinorrhea; moist mucous membranes; pharynx without lesions noted NECK: Supple, no meningismus, no LAD  CARD: RRR; S1 and S2 appreciated; no murmurs, no clicks, no rubs, no gallops RESP: Normal chest excursion without splinting or tachypnea; breath sounds clear and equal bilaterally; no wheezes, no rhonchi, no rales,  ABD/GI: Normal bowel sounds; non-distended; soft, non-tender, no rebound, no guarding BACK:  The back appears normal and  is non-tender to palpation, there is no CVA tenderness; no midline spinal tenderness or step-off or deformity EXT: Normal ROM in all joints; non-tender to palpation; no edema; normal capillary refill; no cyanosis    SKIN: Normal color for age and race; warm NEURO: Moves all extremities equally; sensation to light touch intact diffusely, cranial nerves II through XII intact, 2+ deep tendon reflexes in bilateral lower extremities, normal gait, no clonus PSYCH: The patient's mood and manner are appropriate. Grooming and personal  hygiene are appropriate.  MEDICAL DECISION MAKING: Patient here with left-sided flank pain.  He does appear very comfortable on exam and in no distress. Will check labs, urine, CT. We'll give IV fluids, Toradol. He is here with his 13 year old daughter who cannot drive him home safely after 9 PM. Will avoid narcotics at this time.      ED PROGRESS: Patient's labs show mild elevation of his creatinine at 1.65. He has received IV fluids in the emergency department. He does have blood in his urine but no other sign of infection. CT scan shows 8 x 6 mm left upper ureteral stone with mild hydronephrosis.  Discussed with patient that the stone may be too large to pass on its end. Discussed with him that he will need to follow-up with urology. We'll give referral information. We'll discharge home with pain and nausea medicine. He is allergic to sulfa so therefore will avoid Flomax. He verbalizes understanding and is comfortable with plan.     Layla Maw Kessa Fairbairn, DO 01/05/15 [phone]

## 2015-01-05 MED ORDER — OXYCODONE-ACETAMINOPHEN 5-325 MG PO TABS: 1.0000 | ORAL_TABLET | Freq: Four times a day (QID) | ORAL | Status: DC | PRN

## 2015-01-05 MED ORDER — OXYCODONE-ACETAMINOPHEN 5-325 MG PO TABS
1.0000 | ORAL_TABLET | Freq: Four times a day (QID) | ORAL | Status: DC | PRN
Start: 2015-01-05 — End: 2015-04-08

## 2015-01-05 MED ORDER — IBUPROFEN 800 MG PO TABS: 800.0000 mg | ORAL_TABLET | Freq: Three times a day (TID) | ORAL | Status: DC | PRN

## 2015-01-05 MED ORDER — ONDANSETRON 4 MG PO TBDP: 4.0000 mg | ORAL_TABLET | Freq: Three times a day (TID) | ORAL | Status: DC | PRN

## 2015-01-05 NOTE — Discharge Instructions (Signed)

## 2015-01-06 ENCOUNTER — Ambulatory Visit: Payer: BC Managed Care – PPO | Admitting: Family Medicine

## 2015-01-06 ENCOUNTER — Encounter: Payer: Self-pay | Admitting: Family

## 2015-01-06 ENCOUNTER — Ambulatory Visit (INDEPENDENT_AMBULATORY_CARE_PROVIDER_SITE_OTHER): Payer: BC Managed Care – PPO | Admitting: Family

## 2015-01-06 VITALS — BP 117/75 | HR 80 | Temp 97.0°F | Ht 67.0 in | Wt 350.8 lb

## 2015-01-06 DIAGNOSIS — E08339 Diabetes mellitus due to underlying condition with moderate nonproliferative diabetic retinopathy without macular edema: Secondary | ICD-10-CM

## 2015-01-06 DIAGNOSIS — E559 Vitamin D deficiency, unspecified: Secondary | ICD-10-CM

## 2015-01-06 DIAGNOSIS — I10 Essential (primary) hypertension: Secondary | ICD-10-CM

## 2015-01-06 DIAGNOSIS — E785 Hyperlipidemia, unspecified: Secondary | ICD-10-CM

## 2015-01-06 DIAGNOSIS — E669 Obesity, unspecified: Secondary | ICD-10-CM

## 2015-01-06 DIAGNOSIS — N200 Calculus of kidney: Secondary | ICD-10-CM

## 2015-01-06 DIAGNOSIS — M109 Gout, unspecified: Secondary | ICD-10-CM

## 2015-01-06 LAB — POCT GLYCOSYLATED HEMOGLOBIN (HGB A1C): Hemoglobin A1C: 5.7

## 2015-01-06 LAB — POCT UA - MICROALBUMIN: MICROALBUMIN (UR) POC: NEGATIVE mg/L

## 2015-01-06 NOTE — Patient Instructions (Signed)

## 2015-01-06 NOTE — Progress Notes (Signed)
Subjective:    Patient ID: Alex Harrison, male    DOB: 06/22/1971, 44 y.o.   MRN: [id number]  Diabetes He presents for his follow-up diabetic visit. He has type 2 diabetes mellitus. His disease course has been stable. There are no hypoglycemic associated symptoms. Pertinent negatives for hypoglycemia include no confusion, dizziness or sleepiness. Pertinent negatives for diabetes include no foot paresthesias, no foot ulcerations and no visual change. Symptoms are stable. Pertinent negatives for diabetic complications include no heart disease, nephropathy or peripheral neuropathy. Risk factors for coronary artery disease include diabetes mellitus, dyslipidemia, male sex, obesity, hypertension and sedentary lifestyle. Current diabetic treatment includes diet ("Stop taking metformin in March"). He is following a generally healthy diet. An ACE inhibitor/angiotensin II receptor blocker is being taken. Eye exam is current.  Hyperlipidemia This is a chronic problem. The current episode started more than 1 year ago. The problem is uncontrolled. Recent lipid tests were reviewed and are normal. Exacerbating diseases include diabetes and obesity. He has no history of hypothyroidism. Pertinent negatives include no leg pain or myalgias. Current antihyperlipidemic treatment includes statins. The current treatment provides moderate improvement of lipids. Risk factors for coronary artery disease include dyslipidemia, diabetes mellitus, hypertension, male sex, obesity and a sedentary lifestyle.      Review of Systems  Constitutional: Negative.   HENT: Negative.   Respiratory: Negative.   Cardiovascular: Negative.   Gastrointestinal: Negative.   Endocrine: Negative.   Genitourinary: Negative.   Musculoskeletal: Negative.  Negative for myalgias.  Neurological: Negative.  Negative for dizziness.  Hematological: Negative.   Psychiatric/Behavioral: Negative.  Negative for confusion.  All other systems reviewed  and are negative.      Objective:   Physical Exam  Constitutional: He is oriented to person, place, and time. He appears well-developed and well-nourished. No distress.  HENT:  Head: Normocephalic.  Right Ear: External ear normal.  Left Ear: External ear normal.  Nose: Nose normal.  Mouth/Throat: Oropharynx is clear and moist.  Eyes: Pupils are equal, round, and reactive to light. Right eye exhibits no discharge. Left eye exhibits no discharge.  Neck: Normal range of motion. Neck supple. No thyromegaly present.  Cardiovascular: Normal rate, regular rhythm, normal heart sounds and intact distal pulses.   No murmur heard. Pulmonary/Chest: Effort normal and breath sounds normal. No respiratory distress. He has no wheezes.  Abdominal: Soft. Bowel sounds are normal. He exhibits no distension. There is no tenderness.  Musculoskeletal: Normal range of motion. He exhibits no edema or tenderness.  Neurological: He is alert and oriented to person, place, and time. He has normal reflexes. No cranial nerve deficit.  Skin: Skin is warm and dry. No rash noted. No erythema.  Psychiatric: He has a normal mood and affect. His behavior is normal. Judgment and thought content normal.  Vitals reviewed.   BP 117/75 mmHg  Pulse 80  Temp(Src) 97 F (36.1 C) (Oral)  Ht 5' 7" (1.702 m)  Wt 350 lb 12.8 oz (159.122 kg)  BMI 54.93 kg/m2       Assessment & Plan:  1. 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 Essential hypertension - CMP14+EGFR  3. Vitamin D deficiency - CMP14+EGFR - Vit D  25 hydroxy (rtn osteoporosis monitoring)  4. Gout of left foot, unspecified cause, unspecified chronicity - CMP14+EGFR  5. HLD (hyperlipidemia) - CMP14+EGFR - Lipid panel  6.  Obesity - CMP14+EGFR  7. Kidney stone on left side -  CMP14+EGFR - Ambulatory referral to Urology   Continue all meds Labs pending- If Hgb A1C is normal will d/c lisinpril Health Maintenance reviewed Diet and exercise encouraged RTO 3 months  Evelina Dun, FNP

## 2015-01-07 LAB — CMP14+EGFR
A/G RATIO: 1.4 (ref 1.1–2.5)
ALT: 28 IU/L (ref 0–44)
AST: 22 IU/L (ref 0–40)
Albumin: 3.8 g/dL (ref 3.5–5.5)
Alkaline Phosphatase: 83 IU/L (ref 39–117)
BILIRUBIN TOTAL: 0.9 mg/dL (ref 0.0–1.2)
BUN / CREAT RATIO: 7 — AB (ref 9–20)
BUN: 12 mg/dL (ref 6–24)
CO2: 24 mmol/L (ref 18–29)
Calcium: 8.9 mg/dL (ref 8.7–10.2)
Chloride: 104 mmol/L (ref 97–108)
Creatinine, Ser: 1.84 mg/dL — ABNORMAL HIGH (ref 0.76–1.27)
GFR calc Af Amer: 50 mL/min/{1.73_m2} — ABNORMAL LOW (ref >59)
GFR, EST NON AFRICAN AMERICAN: 43 mL/min/{1.73_m2} — AB (ref >59)
Globulin, Total: 2.7 g/dL (ref 1.5–4.5)
Glucose: 84 mg/dL (ref 65–99)
Potassium: 4.5 mmol/L (ref 3.5–5.2)
SODIUM: 141 mmol/L (ref 134–144)
TOTAL PROTEIN: 6.5 g/dL (ref 6.0–8.5)

## 2015-01-07 LAB — LIPID PANEL
CHOL/HDL RATIO: 4 ratio (ref 0.0–5.0)
CHOLESTEROL TOTAL: 127 mg/dL (ref 100–199)
HDL: 32 mg/dL — AB (ref >39)
LDL CALC: 79 mg/dL (ref 0–99)
TRIGLYCERIDES: 80 mg/dL (ref 0–149)
VLDL CHOLESTEROL CAL: 16 mg/dL (ref 5–40)

## 2015-01-07 LAB — VITAMIN D 25 HYDROXY (VIT D DEFICIENCY, FRACTURES): Vit D, 25-Hydroxy: 44.2 ng/mL (ref 30.0–100.0)

## 2015-01-07 MED FILL — Oxycodone w/ Acetaminophen Tab 5-325 MG: ORAL | Qty: 6 | Status: AC

## 2015-04-08 ENCOUNTER — Encounter: Payer: Self-pay | Admitting: Family

## 2015-04-08 ENCOUNTER — Ambulatory Visit (INDEPENDENT_AMBULATORY_CARE_PROVIDER_SITE_OTHER): Payer: BC Managed Care – PPO | Admitting: Family

## 2015-04-08 VITALS — BP 127/86 | HR 80 | Temp 97.2°F | Ht 67.0 in | Wt 353.4 lb

## 2015-04-08 DIAGNOSIS — E785 Hyperlipidemia, unspecified: Secondary | ICD-10-CM | POA: Diagnosis not present

## 2015-04-08 DIAGNOSIS — E559 Vitamin D deficiency, unspecified: Secondary | ICD-10-CM | POA: Diagnosis not present

## 2015-04-08 DIAGNOSIS — E669 Obesity, unspecified: Secondary | ICD-10-CM

## 2015-04-08 DIAGNOSIS — M109 Gout, unspecified: Secondary | ICD-10-CM | POA: Diagnosis not present

## 2015-04-08 DIAGNOSIS — R739 Hyperglycemia, unspecified: Secondary | ICD-10-CM | POA: Insufficient documentation

## 2015-04-08 NOTE — Patient Instructions (Signed)
Health Maintenance A healthy lifestyle and preventative care can promote health and wellness.  Maintain regular health, dental, and eye exams.  Eat a healthy diet. Foods like vegetables, fruits, whole grains, low-fat dairy products, and lean protein foods contain the nutrients you need and are low in calories. Decrease your intake of foods high in solid fats, added sugars, and salt. Get information about a proper diet from your health care provider, if necessary.  Regular physical exercise is one of the most important things you can do for your health. Most adults should get at least 150 minutes of moderate-intensity exercise (any activity that increases your heart rate and causes you to sweat) each week. In addition, most adults need muscle-strengthening exercises on 2 or more days a week.   Maintain a healthy weight. The body mass index (BMI) is a screening tool to identify possible weight problems. It provides an estimate of body fat based on height and weight. Your health care provider can find your BMI and can help you achieve or maintain a healthy weight. For males 20 years and older:  A BMI below 18.5 is considered underweight.  A BMI of 18.5 to 24.9 is normal.  A BMI of 25 to 29.9 is considered overweight.  A BMI of 30 and above is considered obese.  Maintain normal blood lipids and cholesterol by exercising and minimizing your intake of saturated fat. Eat a balanced diet with plenty of fruits and vegetables. Blood tests for lipids and cholesterol should begin at age 20 and be repeated every 5 years. If your lipid or cholesterol levels are high, you are over age 50, or you are at high risk for heart disease, you may need your cholesterol levels checked more frequently.Ongoing high lipid and cholesterol levels should be treated with medicines if diet and exercise are not working.  If you smoke, find out from your health care provider how to quit. If you do not use tobacco, do not  start.  Lung cancer screening is recommended for adults aged 55-80 years who are at high risk for developing lung cancer because of a history of smoking. A yearly low-dose CT scan of the lungs is recommended for people who have at least a 30-pack-year history of smoking and are current smokers or have quit within the past 15 years. A pack year of smoking is smoking an average of 1 pack of cigarettes a day for 1 year (for example, a 30-pack-year history of smoking could mean smoking 1 pack a day for 30 years or 2 packs a day for 15 years). Yearly screening should continue until the smoker has stopped smoking for at least 15 years. Yearly screening should be stopped for people who develop a health problem that would prevent them from having lung cancer treatment.  If you choose to drink alcohol, do not have more than 2 drinks per day. One drink is considered to be 12 oz (360 mL) of beer, 5 oz (150 mL) of wine, or 1.5 oz (45 mL) of liquor.  Avoid the use of street drugs. Do not share needles with anyone. Ask for help if you need support or instructions about stopping the use of drugs.  High blood pressure causes heart disease and increases the risk of stroke. Blood pressure should be checked at least every 1-2 years. Ongoing high blood pressure should be treated with medicines if weight loss and exercise are not effective.  If you are 45-79 years old, ask your health care provider if   you should take aspirin to prevent heart disease.  Diabetes screening involves taking a blood sample to check your fasting blood sugar level. This should be done once every 3 years after age 45 if you are at a normal weight and without risk factors for diabetes. Testing should be considered at a younger age or be carried out more frequently if you are overweight and have at least 1 risk factor for diabetes.  Colorectal cancer can be detected and often prevented. Most routine colorectal cancer screening begins at the age of 50  and continues through age 75. However, your health care provider may recommend screening at an earlier age if you have risk factors for colon cancer. On a yearly basis, your health care provider may provide home test kits to check for hidden blood in the stool. A small camera at the end of a tube may be used to directly examine the colon (sigmoidoscopy or colonoscopy) to detect the earliest forms of colorectal cancer. Talk to your health care provider about this at age 50 when routine screening begins. A direct exam of the colon should be repeated every 5-10 years through age 75, unless early forms of precancerous polyps or small growths are found.  People who are at an increased risk for hepatitis B should be screened for this virus. You are considered at high risk for hepatitis B if:  You were born in a country where hepatitis B occurs often. Talk with your health care provider about which countries are considered high risk.  Your parents were born in a high-risk country and you have not received a shot to protect against hepatitis B (hepatitis B vaccine).  You have HIV or AIDS.  You use needles to inject street drugs.  You live with, or have sex with, someone who has hepatitis B.  You are a man who has sex with other men (MSM).  You get hemodialysis treatment.  You take certain medicines for conditions like cancer, organ transplantation, and autoimmune conditions.  Hepatitis C blood testing is recommended for all people born from 1945 through 1965 and any individual with known risk factors for hepatitis C.  Healthy men should no longer receive prostate-specific antigen (PSA) blood tests as part of routine cancer screening. Talk to your health care provider about prostate cancer screening.  Testicular cancer screening is not recommended for adolescents or adult males who have no symptoms. Screening includes self-exam, a health care provider exam, and other screening tests. Consult with your  health care provider about any symptoms you have or any concerns you have about testicular cancer.  Practice safe sex. Use condoms and avoid high-risk sexual practices to reduce the spread of sexually transmitted infections (STIs).  You should be screened for STIs, including gonorrhea and chlamydia if:  You are sexually active and are younger than 24 years.  You are older than 24 years, and your health care provider tells you that you are at risk for this type of infection.  Your sexual activity has changed since you were last screened, and you are at an increased risk for chlamydia or gonorrhea. Ask your health care provider if you are at risk.  If you are at risk of being infected with HIV, it is recommended that you take a prescription medicine daily to prevent HIV infection. This is called pre-exposure prophylaxis (PrEP). You are considered at risk if:  You are a man who has sex with other men (MSM).  You are a heterosexual man who   is sexually active with multiple partners.  You take drugs by injection.  You are sexually active with a partner who has HIV.  Talk with your health care provider about whether you are at high risk of being infected with HIV. If you choose to begin PrEP, you should first be tested for HIV. You should then be tested every 3 months for as long as you are taking PrEP.  Use sunscreen. Apply sunscreen liberally and repeatedly throughout the day. You should seek shade when your shadow is shorter than you. Protect yourself by wearing long sleeves, pants, a wide-brimmed hat, and sunglasses year round whenever you are outdoors.  Tell your health care provider of new moles or changes in moles, especially if there is a change in shape or color. Also, tell your health care provider if a mole is larger than the size of a pencil eraser.  A one-time screening for abdominal aortic aneurysm (AAA) and surgical repair of large AAAs by ultrasound is recommended for men aged  65-75 years who are current or former smokers.  Stay current with your vaccines (immunizations). Document Released: 05/27/2008 Document Revised: 12/04/2013 Document Reviewed: 04/26/2011 [facility] Patient Information 2015 [place], [place]. This information is not intended to replace advice given to you by your health care provider. Make sure you discuss any questions you have with your health care provider. Hyperglycemia Hyperglycemia occurs when the glucose (sugar) in your blood is too high. Hyperglycemia can happen for many reasons, but it most often happens to people who do not know they have diabetes or are not managing their diabetes properly.  CAUSES  Whether you have diabetes or not, there are other causes of hyperglycemia. Hyperglycemia can occur when you have diabetes, but it can also occur in other situations that you might not be as aware of, such as: Diabetes  If you have diabetes and are having problems controlling your blood glucose, hyperglycemia could occur because of some of the following reasons:  Not following your meal plan.  Not taking your diabetes medications or not taking it properly.  Exercising less or doing less activity than you normally do.  Being sick. Pre-diabetes  This cannot be ignored. Before people develop Type 2 diabetes, they almost always have "pre-diabetes." This is when your blood glucose levels are higher than normal, but not yet high enough to be diagnosed as diabetes. Research has shown that some long-term damage to the body, especially the heart and circulatory system, may already be occurring during pre-diabetes. If you take action to manage your blood glucose when you have pre-diabetes, you may delay or prevent Type 2 diabetes from developing. Stress  If you have diabetes, you may be "diet" controlled or on oral medications or insulin to control your diabetes. However, you may find that your blood glucose is higher than usual in the hospital whether  you have diabetes or not. This is often referred to as "stress hyperglycemia." Stress can elevate your blood glucose. This happens because of hormones put out by the body during times of stress. If stress has been the cause of your high blood glucose, it can be followed regularly by your caregiver. That way he/she can make sure your hyperglycemia does not continue to get worse or progress to diabetes. Steroids  Steroids are medications that act on the infection fighting system (immune system) to block inflammation or infection. One side effect can be a rise in blood glucose. Most people can produce enough extra insulin to allow for this  rise, but for those who cannot, steroids make blood glucose levels go even higher. It is not unusual for steroid treatments to "uncover" diabetes that is developing. It is not always possible to determine if the hyperglycemia will go away after the steroids are stopped. A special blood test called an A1c is sometimes done to determine if your blood glucose was elevated before the steroids were started. SYMPTOMS  Thirsty.  Frequent urination.  Dry mouth.  Blurred vision.  Tired or fatigue.  Weakness.  Sleepy.  Tingling in feet or leg. DIAGNOSIS  Diagnosis is made by monitoring blood glucose in one or all of the following ways:  A1c test. This is a chemical found in your blood.  Fingerstick blood glucose monitoring.  Laboratory results. TREATMENT  First, knowing the cause of the hyperglycemia is important before the hyperglycemia can be treated. Treatment may include, but is not be limited to:  Education.  Change or adjustment in medications.  Change or adjustment in meal plan.  Treatment for an illness, infection, etc.  More frequent blood glucose monitoring.  Change in exercise plan.  Decreasing or stopping steroids.  Lifestyle changes. HOME CARE INSTRUCTIONS   Test your blood glucose as directed.  Exercise regularly. Your caregiver  will give you instructions about exercise. Pre-diabetes or diabetes which comes on with stress is helped by exercising.  Eat wholesome, balanced meals. Eat often and at regular, fixed times. Your caregiver or nutritionist will give you a meal plan to guide your sugar intake.  Being at an ideal weight is important. If needed, losing as little as 10 to 15 pounds may help improve blood glucose levels. SEEK MEDICAL CARE IF:   You have questions about medicine, activity, or diet.  You continue to have symptoms (problems such as increased thirst, urination, or weight gain). SEEK IMMEDIATE MEDICAL CARE IF:   You are vomiting or have diarrhea.  Your breath smells fruity.  You are breathing faster or slower.  You are very sleepy or incoherent.  You have numbness, tingling, or pain in your feet or hands.  You have chest pain.  Your symptoms get worse even though you have been following your caregiver's orders.  If you have any other questions or concerns. Document Released: 05/25/2001 Document Revised: 02/21/2012 Document Reviewed: 03/27/2012 [facility] Patient Information 2015 [place], [place]. This information is not intended to replace advice given to you by your health care provider. Make sure you discuss any questions you have with your health care provider.

## 2015-04-08 NOTE — Progress Notes (Signed)
   Subjective:    Patient ID: Alex Harrison, male    DOB: [birth year], 44 y.o.   MRN: [id number]  Hyperlipidemia This is a chronic problem. The current episode started more than 1 year ago. The problem is controlled. Recent lipid tests were reviewed and are normal. Exacerbating diseases include diabetes and obesity. He has no history of hypothyroidism. Pertinent negatives include no leg pain or myalgias. Current antihyperlipidemic treatment includes statins. The current treatment provides moderate improvement of lipids. Risk factors for coronary artery disease include dyslipidemia, diabetes mellitus, hypertension, male sex, obesity and a sedentary lifestyle.  Hyperglycemia  Pt states he had be diagnosed in the past as a DM, but then was taken off of his medication. Pt's last lab work shows a HgbA1c of 5.7/. Pt educated on importance of low carb diet and exercise.  Gout Pt currently taking allopurinol daily. Pt states he has not had a gout attack since starting on it.     Review of Systems  Constitutional: Negative.   HENT: Negative.   Respiratory: Negative.   Cardiovascular: Negative.   Gastrointestinal: Negative.   Endocrine: Negative.   Genitourinary: Negative.   Musculoskeletal: Negative.  Negative for myalgias.  Neurological: Negative.   Hematological: Negative.   Psychiatric/Behavioral: Negative.   All other systems reviewed and are negative.      Objective:   Physical Exam  Constitutional: He is oriented to person, place, and time. He appears well-developed and well-nourished. No distress.  HENT:  Head: Normocephalic.  Right Ear: External ear normal.  Left Ear: External ear normal.  Nose: Nose normal.  Mouth/Throat: Oropharynx is clear and moist.  Eyes: Pupils are equal, round, and reactive to light. Right eye exhibits no discharge. Left eye exhibits no discharge.  Neck: Normal range of motion. Neck supple. No thyromegaly present.  Cardiovascular: Normal rate, regular  rhythm, normal heart sounds and intact distal pulses.   No murmur heard. Pulmonary/Chest: Effort normal and breath sounds normal. No respiratory distress. He has no wheezes.  Abdominal: Soft. Bowel sounds are normal. He exhibits no distension. There is no tenderness.  Musculoskeletal: Normal range of motion. He exhibits no edema or tenderness.  Neurological: He is alert and oriented to person, place, and time. He has normal reflexes. No cranial nerve deficit.  Skin: Skin is warm and dry. No rash noted. No erythema.  Psychiatric: He has a normal mood and affect. His behavior is normal. Judgment and thought content normal.  Vitals reviewed.   BP 127/86 mmHg  Pulse 80  Temp(Src) 97.2 F (36.2 C) (Oral)  Ht $R'5\' 7"'ql$  (1.702 m)  Wt 353 lb 6.4 oz (160.301 kg)  BMI 55.34 kg/m2       Assessment & Plan:  1. HLD (hyperlipidemia) - CMP14+EGFR - Lipid panel  2. Hyperglycemia - CMP14+EGFR  3. Obesity - CMP14+EGFR  4. Gout of left foot, unspecified cause, unspecified chronicity - CMP14+EGFR  5. Vitamin D deficiency  - CMP14+EGFR - Vit D  25 hydroxy (rtn osteoporosis monitoring)   Continue all meds Labs pending Health Maintenance reviewed Diet and exercise encouraged RTO 3 month  Evelina Dun, FNP

## 2015-04-09 LAB — LIPID PANEL
Chol/HDL Ratio: 3.1 ratio (ref 0.0–5.0)
Cholesterol, Total: 118 mg/dL (ref 100–199)
HDL: 38 mg/dL — ABNORMAL LOW
LDL Calculated: 68 mg/dL (ref 0–99)
Triglycerides: 61 mg/dL (ref 0–149)
VLDL Cholesterol Cal: 12 mg/dL (ref 5–40)

## 2015-04-09 LAB — CMP14+EGFR
A/G RATIO: 1.6 (ref 1.1–2.5)
ALBUMIN: 4.2 g/dL (ref 3.5–5.5)
ALT: 26 IU/L (ref 0–44)
AST: 23 IU/L (ref 0–40)
Alkaline Phosphatase: 71 IU/L (ref 39–117)
BILIRUBIN TOTAL: 0.6 mg/dL (ref 0.0–1.2)
BUN / CREAT RATIO: 9 (ref 9–20)
BUN: 10 mg/dL (ref 6–24)
CALCIUM: 9 mg/dL (ref 8.7–10.2)
CO2: 25 mmol/L (ref 18–29)
CREATININE: 1.07 mg/dL (ref 0.76–1.27)
Chloride: 101 mmol/L (ref 97–108)
GFR calc Af Amer: 96 mL/min/{1.73_m2} (ref >59)
GFR calc non Af Amer: 83 mL/min/{1.73_m2} (ref >59)
GLUCOSE: 96 mg/dL (ref 65–99)
Globulin, Total: 2.6 g/dL (ref 1.5–4.5)
POTASSIUM: 4.5 mmol/L (ref 3.5–5.2)
Sodium: 140 mmol/L (ref 134–144)
Total Protein: 6.8 g/dL (ref 6.0–8.5)

## 2015-04-09 LAB — VITAMIN D 25 HYDROXY (VIT D DEFICIENCY, FRACTURES): Vit D, 25-Hydroxy: 44.3 ng/mL (ref 30.0–100.0)

## 2015-07-11 ENCOUNTER — Ambulatory Visit (INDEPENDENT_AMBULATORY_CARE_PROVIDER_SITE_OTHER): Payer: BC Managed Care – PPO | Admitting: Family

## 2015-07-11 ENCOUNTER — Encounter: Payer: Self-pay | Admitting: Family

## 2015-07-11 VITALS — BP 138/88 | HR 77 | Temp 97.5°F | Ht 67.0 in | Wt 351.0 lb

## 2015-07-11 DIAGNOSIS — M109 Gout, unspecified: Secondary | ICD-10-CM | POA: Diagnosis not present

## 2015-07-11 DIAGNOSIS — E559 Vitamin D deficiency, unspecified: Secondary | ICD-10-CM | POA: Diagnosis not present

## 2015-07-11 DIAGNOSIS — E785 Hyperlipidemia, unspecified: Secondary | ICD-10-CM | POA: Diagnosis not present

## 2015-07-11 DIAGNOSIS — E669 Obesity, unspecified: Secondary | ICD-10-CM | POA: Diagnosis not present

## 2015-07-11 NOTE — Addendum Note (Signed)
Addended by: Prescott Gum on: 07/11/2015 09:45 AM   Modules accepted: Kipp Brood

## 2015-07-11 NOTE — Progress Notes (Signed)
   Subjective:    Patient ID: Alex Harrison, male    DOB: 1971-05-20, 44 y.o.   MRN: [id number]  Pt presents to the office today for chronic follow up.  Hyperlipidemia This is a chronic problem. The current episode started more than 1 year ago. The problem is controlled. Recent lipid tests were reviewed and are normal. Exacerbating diseases include obesity. He has no history of hypothyroidism. Pertinent negatives include no leg pain or myalgias. Current antihyperlipidemic treatment includes statins. The current treatment provides moderate improvement of lipids. Risk factors for coronary artery disease include dyslipidemia, diabetes mellitus, hypertension, male sex, obesity and a sedentary lifestyle.  Gout  PT taking allopurinol daily and states he has not had an "attack since starting the medication at least 3 years ago".     Review of Systems  Constitutional: Negative.   HENT: Negative.   Respiratory: Negative.   Cardiovascular: Negative.   Gastrointestinal: Negative.   Endocrine: Negative.   Genitourinary: Negative.   Musculoskeletal: Negative.  Negative for myalgias.  Neurological: Negative.   Hematological: Negative.   Psychiatric/Behavioral: Negative.   All other systems reviewed and are negative.      Objective:   Physical Exam  Constitutional: He is oriented to person, place, and time. He appears well-developed and well-nourished. No distress.  HENT:  Head: Normocephalic.  Right Ear: External ear normal.  Left Ear: External ear normal.  Nose: Nose normal.  Mouth/Throat: Oropharynx is clear and moist.  Eyes: Pupils are equal, round, and reactive to light. Right eye exhibits no discharge. Left eye exhibits no discharge.  Neck: Normal range of motion. Neck supple. No thyromegaly present.  Cardiovascular: Normal rate, regular rhythm, normal heart sounds and intact distal pulses.   No murmur heard. Pulmonary/Chest: Effort normal and breath sounds normal. No respiratory  distress. He has no wheezes.  Abdominal: Soft. Bowel sounds are normal. He exhibits no distension. There is no tenderness.  Musculoskeletal: Normal range of motion. He exhibits no edema or tenderness.  Neurological: He is alert and oriented to person, place, and time. He has normal reflexes. No cranial nerve deficit.  Skin: Skin is warm and dry. No rash noted. No erythema.  Psychiatric: He has a normal mood and affect. His behavior is normal. Judgment and thought content normal.  Vitals reviewed.   BP 138/88 mmHg  Pulse 77  Temp(Src) 97.5 F (36.4 C) (Oral)  Ht $R'5\' 7"'hB$  (1.702 m)  Wt 351 lb (159.213 kg)  BMI 54.96 kg/m2       Assessment & Plan:  1. HLD (hyperlipidemia) - CMP14+EGFR - Lipid panel  2. Obesity - CMP14+EGFR  3. Gout of left foot, unspecified cause, unspecified chronicity - CMP14+EGFR  4. Vitamin D deficiency - CMP14+EGFR - Vit D  25 hydroxy (rtn osteoporosis monitoring)   Continue all meds Labs pending Health Maintenance reviewed Diet and exercise encouraged RTO 6 months  Evelina Dun, FNP

## 2015-07-11 NOTE — Patient Instructions (Signed)

## 2015-07-12 LAB — CMP14+EGFR
ALBUMIN: 4.2 g/dL (ref 3.5–5.5)
ALK PHOS: 63 IU/L (ref 39–117)
ALT: 27 IU/L (ref 0–44)
AST: 19 IU/L (ref 0–40)
Albumin/Globulin Ratio: 1.8 (ref 1.1–2.5)
BILIRUBIN TOTAL: 0.8 mg/dL (ref 0.0–1.2)
BUN / CREAT RATIO: 9 (ref 9–20)
BUN: 8 mg/dL (ref 6–24)
CHLORIDE: 102 mmol/L (ref 97–108)
CO2: 24 mmol/L (ref 18–29)
CREATININE: 0.9 mg/dL (ref 0.76–1.27)
Calcium: 9.1 mg/dL (ref 8.7–10.2)
GFR calc non Af Amer: 103 mL/min/{1.73_m2} (ref >59)
GFR, EST AFRICAN AMERICAN: 119 mL/min/{1.73_m2} (ref >59)
GLOBULIN, TOTAL: 2.4 g/dL (ref 1.5–4.5)
Glucose: 97 mg/dL (ref 65–99)
Potassium: 4.4 mmol/L (ref 3.5–5.2)
Sodium: 141 mmol/L (ref 134–144)
Total Protein: 6.6 g/dL (ref 6.0–8.5)

## 2015-07-12 LAB — LIPID PANEL
Chol/HDL Ratio: 3.5 ratio units (ref 0.0–5.0)
Cholesterol, Total: 116 mg/dL (ref 100–199)
HDL: 33 mg/dL — ABNORMAL LOW (ref >39)
LDL Calculated: 66 mg/dL (ref 0–99)
TRIGLYCERIDES: 84 mg/dL (ref 0–149)
VLDL Cholesterol Cal: 17 mg/dL (ref 5–40)

## 2015-07-12 LAB — VITAMIN D 25 HYDROXY (VIT D DEFICIENCY, FRACTURES): Vit D, 25-Hydroxy: 28.1 ng/mL — ABNORMAL LOW (ref 30.0–100.0)

## 2015-07-14 ENCOUNTER — Encounter: Payer: Self-pay | Admitting: *Deleted

## 2015-07-14 ENCOUNTER — Telehealth: Payer: Self-pay | Admitting: *Deleted

## 2015-07-14 ENCOUNTER — Other Ambulatory Visit: Payer: Self-pay | Admitting: Family

## 2015-07-14 NOTE — Telephone Encounter (Signed)
Patient aware of lab results and to start taking OTC Fish oil.  Patient is also aware to continue taking Vit. D.

## 2015-07-14 NOTE — Telephone Encounter (Signed)
-----   Message from Junie Spencer, FNP sent at 07/14/2015 11:25 AM EDT ----- Kidney and liver function stable Cholesterol levels WNL- HDL low- PT would benefit from OTC Fish Oil Vit D levels low- Continue Vit D

## 2015-09-27 ENCOUNTER — Other Ambulatory Visit: Payer: Self-pay | Admitting: Family Medicine

## 2015-10-13 ENCOUNTER — Ambulatory Visit (INDEPENDENT_AMBULATORY_CARE_PROVIDER_SITE_OTHER): Payer: BC Managed Care – PPO

## 2015-10-13 DIAGNOSIS — Z23 Encounter for immunization: Secondary | ICD-10-CM

## 2015-10-31 ENCOUNTER — Other Ambulatory Visit: Payer: Self-pay | Admitting: Family

## 2015-12-16 ENCOUNTER — Telehealth: Payer: Self-pay

## 2015-12-16 NOTE — Telephone Encounter (Signed)
This year Crestor non formulary for insurance  Need to either change to formulary which is Vytorin or write letter for medical exception and fax to  [phone]

## 2015-12-18 ENCOUNTER — Telehealth: Payer: Self-pay | Admitting: Family

## 2015-12-18 MED ORDER — EZETIMIBE-SIMVASTATIN 10-20 MG PO TABS: 1.0000 | ORAL_TABLET | Freq: Every day | ORAL | Status: DC

## 2015-12-18 NOTE — Telephone Encounter (Signed)
Crestor d/c and Vytorin Prescription sent to pharmacy because Crestor is not on formulary from insurance

## 2015-12-19 MED ORDER — ATORVASTATIN CALCIUM 40 MG PO TABS: 40.0000 mg | ORAL_TABLET | Freq: Every day | ORAL | Status: DC

## 2015-12-19 NOTE — Telephone Encounter (Signed)
Patient aware that rx for lipitor sent to pharmacy.

## 2015-12-19 NOTE — Telephone Encounter (Signed)
Lipitor Prescription sent to pharmacy   

## 2016-01-14 ENCOUNTER — Ambulatory Visit: Payer: BC Managed Care – PPO | Admitting: Family

## 2016-01-15 ENCOUNTER — Ambulatory Visit (INDEPENDENT_AMBULATORY_CARE_PROVIDER_SITE_OTHER): Payer: BC Managed Care – PPO | Admitting: Family

## 2016-01-15 ENCOUNTER — Encounter: Payer: Self-pay | Admitting: Family

## 2016-01-15 VITALS — BP 120/75 | HR 77 | Temp 97.0°F | Ht 67.0 in | Wt 371.2 lb

## 2016-01-15 DIAGNOSIS — E559 Vitamin D deficiency, unspecified: Secondary | ICD-10-CM | POA: Diagnosis not present

## 2016-01-15 DIAGNOSIS — E785 Hyperlipidemia, unspecified: Secondary | ICD-10-CM

## 2016-01-15 DIAGNOSIS — R7303 Prediabetes: Secondary | ICD-10-CM | POA: Diagnosis not present

## 2016-01-15 DIAGNOSIS — M109 Gout, unspecified: Secondary | ICD-10-CM | POA: Diagnosis not present

## 2016-01-15 DIAGNOSIS — E669 Obesity, unspecified: Secondary | ICD-10-CM

## 2016-01-15 DIAGNOSIS — E8881 Metabolic syndrome: Secondary | ICD-10-CM

## 2016-01-15 LAB — POCT GLYCOSYLATED HEMOGLOBIN (HGB A1C): Hemoglobin A1C: 6

## 2016-01-15 MED ORDER — ATORVASTATIN CALCIUM 40 MG PO TABS: 40.0000 mg | ORAL_TABLET | Freq: Every day | ORAL | Status: DC

## 2016-01-15 MED ORDER — ALLOPURINOL 300 MG PO TABS: 300.0000 mg | ORAL_TABLET | Freq: Every day | ORAL | Status: DC

## 2016-01-15 NOTE — Progress Notes (Signed)
Subjective:    Patient ID: Alex Harrison, male    DOB: [birth year], 45 y.o.   MRN: [id number]  Pt presents to the office today for chronic follow up.  Diabetes He presents for his follow-up diabetic visit. He has type 2 diabetes mellitus. His disease course has been stable. There are no diabetic associated symptoms. Pertinent negatives for diabetes include no blurred vision, no fatigue and no foot ulcerations. There are no hypoglycemic complications. Symptoms are improving. There are no diabetic complications. Risk factors for coronary artery disease include dyslipidemia, hypertension, male sex, obesity, sedentary lifestyle and family history. Current diabetic treatment includes diet (PT states he was taking metformin but was taken off in 2015). He is compliant with treatment most of the time. He is following a generally healthy diet. (Does not check BS) An ACE inhibitor/angiotensin II receptor blocker is not being taken. Eye exam is not current.  Hyperlipidemia This is a chronic problem. The current episode started more than 1 year ago. The problem is controlled. Recent lipid tests were reviewed and are normal. Exacerbating diseases include obesity. He has no history of hypothyroidism. Pertinent negatives include no leg pain or myalgias. Current antihyperlipidemic treatment includes statins. The current treatment provides moderate improvement of lipids. Risk factors for coronary artery disease include dyslipidemia, diabetes mellitus, hypertension, male sex, obesity and a sedentary lifestyle.  Gout  PT taking allopurinol daily and states he has not had an "attack since starting the medication at least 3 years ago".     Review of Systems  Constitutional: Negative.  Negative for fatigue.  HENT: Negative.   Eyes: Negative for blurred vision.  Respiratory: Negative.   Cardiovascular: Negative.   Gastrointestinal: Negative.   Endocrine: Negative.   Genitourinary: Negative.   Musculoskeletal:  Negative.  Negative for myalgias.  Neurological: Negative.   Hematological: Negative.   Psychiatric/Behavioral: Negative.   All other systems reviewed and are negative.      Objective:   Physical Exam  Constitutional: He is oriented to person, place, and time. He appears well-developed and well-nourished. No distress.  HENT:  Head: Normocephalic.  Right Ear: External ear normal.  Left Ear: External ear normal.  Nose: Nose normal.  Mouth/Throat: Oropharynx is clear and moist.  Eyes: Pupils are equal, round, and reactive to light. Right eye exhibits no discharge. Left eye exhibits no discharge.  Neck: Normal range of motion. Neck supple. No thyromegaly present.  Cardiovascular: Normal rate, regular rhythm, normal heart sounds and intact distal pulses.   No murmur heard. Pulmonary/Chest: Effort normal and breath sounds normal. No respiratory distress. He has no wheezes.  Abdominal: Soft. Bowel sounds are normal. He exhibits no distension. There is no tenderness.  Musculoskeletal: Normal range of motion. He exhibits no edema or tenderness.  Neurological: He is alert and oriented to person, place, and time. He has normal reflexes. No cranial nerve deficit.  Skin: Skin is warm and dry. No rash noted. No erythema.  Psychiatric: He has a normal mood and affect. His behavior is normal. Judgment and thought content normal.  Vitals reviewed.   BP 120/75 mmHg  Pulse 77  Temp(Src) 97 F (36.1 C) (Oral)  Ht _0  (1.702 m)  Wt 371 lb 3.2 oz (168.375 kg)  BMI 58.12 kg/m2       Assessment & Plan:  1. Metabolic syndrome - POCT glycosylated hemoglobin (Hb A1C) - CMP14+EGFR - Microalbumin / creatinine urine ratio  2. Prediabetes - POCT glycosylated hemoglobin (Hb A1C) - CMP14+EGFR - Microalbumin /  creatinine urine ratio  3. Vitamin D deficiency - CMP14+EGFR - VITAMIN D 25 Hydroxy (Vit-D Deficiency, Fractures)  4. Obesity - CMP14+EGFR  5. Gout of left foot, unspecified cause,  unspecified chronicity - CMP14+EGFR - allopurinol (ZYLOPRIM) 300 MG tablet; Take 1 tablet (300 mg total) by mouth daily.  Dispense: 90 tablet; Refill: 2 - Uric acid  6. HLD (hyperlipidemia) - CMP14+EGFR - Lipid panel - atorvastatin (LIPITOR) 40 MG tablet; Take 1 tablet (40 mg total) by mouth daily.  Dispense: 90 tablet; Refill: 1   Continue all meds Labs pending Health Maintenance reviewed Diet and exercise encouraged RTO 6 months  Evelina Dun, FNP

## 2016-01-15 NOTE — Patient Instructions (Addendum)
Health Maintenance, Male A healthy lifestyle and preventative care can promote health and wellness.  Maintain regular health, dental, and eye exams.  Eat a healthy diet. Foods like vegetables, fruits, whole grains, low-fat dairy products, and lean protein foods contain the nutrients you need and are low in calories. Decrease your intake of foods high in solid fats, added sugars, and salt. Get information about a proper diet from your health care provider, if necessary.  Regular physical exercise is one of the most important things you can do for your health. Most adults should get at least 150 minutes of moderate-intensity exercise (any activity that increases your heart rate and causes you to sweat) each week. In addition, most adults need muscle-strengthening exercises on 2 or more days a week.   Maintain a healthy weight. The body mass index (BMI) is a screening tool to identify possible weight problems. It provides an estimate of body fat based on height and weight. Your health care provider can find your BMI and can help you achieve or maintain a healthy weight. For males 20 years and older:  A BMI below 18.5 is considered underweight.  A BMI of 18.5 to 24.9 is normal.  A BMI of 25 to 29.9 is considered overweight.  A BMI of 30 and above is considered obese.  Maintain normal blood lipids and cholesterol by exercising and minimizing your intake of saturated fat. Eat a balanced diet with plenty of fruits and vegetables. Blood tests for lipids and cholesterol should begin at age 20 and be repeated every 5 years. If your lipid or cholesterol levels are high, you are over age 50, or you are at high risk for heart disease, you may need your cholesterol levels checked more frequently.Ongoing high lipid and cholesterol levels should be treated with medicines if diet and exercise are not working.  If you smoke, find out from your health care provider how to quit. If you do not use tobacco, do not  start.  Lung cancer screening is recommended for adults aged 55-80 years who are at high risk for developing lung cancer because of a history of smoking. A yearly low-dose CT scan of the lungs is recommended for people who have at least a 30-pack-year history of smoking and are current smokers or have quit within the past 15 years. A pack year of smoking is smoking an average of 1 pack of cigarettes a day for 1 year (for example, a 30-pack-year history of smoking could mean smoking 1 pack a day for 30 years or 2 packs a day for 15 years). Yearly screening should continue until the smoker has stopped smoking for at least 15 years. Yearly screening should be stopped for people who develop a health problem that would prevent them from having lung cancer treatment.  If you choose to drink alcohol, do not have more than 2 drinks per day. One drink is considered to be 12 oz (360 mL) of beer, 5 oz (150 mL) of wine, or 1.5 oz (45 mL) of liquor.  Avoid the use of street drugs. Do not share needles with anyone. Ask for help if you need support or instructions about stopping the use of drugs.  High blood pressure causes heart disease and increases the risk of stroke. High blood pressure is more likely to develop in:  People who have blood pressure in the end of the normal range (100-139/85-89 mm Hg).  People who are overweight or obese.  People who are African American.    If you are 18-39 years of age, have your blood pressure checked every 3-5 years. If you are 40 years of age or older, have your blood pressure checked every year. You should have your blood pressure measured twice--once when you are at a hospital or clinic, and once when you are not at a hospital or clinic. Record the average of the two measurements. To check your blood pressure when you are not at a hospital or clinic, you can use:  An automated blood pressure machine at a pharmacy.  A home blood pressure monitor.  If you are 45-79 years  old, ask your health care provider if you should take aspirin to prevent heart disease.  Diabetes screening involves taking a blood sample to check your fasting blood sugar level. This should be done once every 3 years after age 45 if you are at a normal weight and without risk factors for diabetes. Testing should be considered at a younger age or be carried out more frequently if you are overweight and have at least 1 risk factor for diabetes.  Colorectal cancer can be detected and often prevented. Most routine colorectal cancer screening begins at the age of 50 and continues through age 75. However, your health care provider may recommend screening at an earlier age if you have risk factors for colon cancer. On a yearly basis, your health care provider may provide home test kits to check for hidden blood in the stool. A small camera at the end of a tube may be used to directly examine the colon (sigmoidoscopy or colonoscopy) to detect the earliest forms of colorectal cancer. Talk to your health care provider about this at age 50 when routine screening begins. A direct exam of the colon should be repeated every 5-10 years through age 75, unless early forms of precancerous polyps or small growths are found.  People who are at an increased risk for hepatitis B should be screened for this virus. You are considered at high risk for hepatitis B if:  You were born in a country where hepatitis B occurs often. Talk with your health care provider about which countries are considered high risk.  Your parents were born in a high-risk country and you have not received a shot to protect against hepatitis B (hepatitis B vaccine).  You have HIV or AIDS.  You use needles to inject street drugs.  You live with, or have sex with, someone who has hepatitis B.  You are a man who has sex with other men (MSM).  You get hemodialysis treatment.  You take certain medicines for conditions like cancer, organ  transplantation, and autoimmune conditions.  Hepatitis C blood testing is recommended for all people born from 1945 through 1965 and any individual with known risk factors for hepatitis C.  Healthy men should no longer receive prostate-specific antigen (PSA) blood tests as part of routine cancer screening. Talk to your health care provider about prostate cancer screening.  Testicular cancer screening is not recommended for adolescents or adult males who have no symptoms. Screening includes self-exam, a health care provider exam, and other screening tests. Consult with your health care provider about any symptoms you have or any concerns you have about testicular cancer.  Practice safe sex. Use condoms and avoid high-risk sexual practices to reduce the spread of sexually transmitted infections (STIs).  You should be screened for STIs, including gonorrhea and chlamydia if:  You are sexually active and are younger than 24 years.  You   are older than 24 years, and your health care provider tells you that you are at risk for this type of infection.  Your sexual activity has changed since you were last screened, and you are at an increased risk for chlamydia or gonorrhea. Ask your health care provider if you are at risk.  If you are at risk of being infected with HIV, it is recommended that you take a prescription medicine daily to prevent HIV infection. This is called pre-exposure prophylaxis (PrEP). You are considered at risk if:  You are a man who has sex with other men (MSM).  You are a heterosexual man who is sexually active with multiple partners.  You take drugs by injection.  You are sexually active with a partner who has HIV.  Talk with your health care provider about whether you are at high risk of being infected with HIV. If you choose to begin PrEP, you should first be tested for HIV. You should then be tested every 3 months for as long as you are taking PrEP.  Use sunscreen. Apply  sunscreen liberally and repeatedly throughout the day. You should seek shade when your shadow is shorter than you. Protect yourself by wearing long sleeves, pants, a wide-brimmed hat, and sunglasses year round whenever you are outdoors.  Tell your health care provider of new moles or changes in moles, especially if there is a change in shape or color. Also, tell your health care provider if a mole is larger than the size of a pencil eraser.  A one-time screening for abdominal aortic aneurysm (AAA) and surgical repair of large AAAs by ultrasound is recommended for men aged 65-75 years who are current or former smokers.  Stay current with your vaccines (immunizations).   This information is not intended to replace advice given to you by your health care provider. Make sure you discuss any questions you have with your health care provider.   Document Released: 05/27/2008 Document Revised: 12/20/2014 Document Reviewed: 04/26/2011 Elsevier Interactive Patient Education 2016 [employer]. Prediabetes Eating Plan Prediabetes--also called impaired glucose tolerance or impaired fasting glucose--is a condition that causes blood sugar (blood glucose) levels to be higher than normal. Following a healthy diet can help to keep prediabetes under control. It can also help to lower the risk of type 2 diabetes and heart disease, which are increased in people who have prediabetes. Along with regular exercise, a healthy diet:  Promotes weight loss.  Helps to control blood sugar levels.  Helps to improve the way that the body uses insulin. WHAT DO I NEED TO KNOW ABOUT THIS EATING PLAN?  Use the glycemic index (GI) to plan your meals. The index tells you how quickly a food will raise your blood sugar. Choose low-GI foods. These foods take a longer time to raise blood sugar.  Pay close attention to the amount of carbohydrates in the food that you eat. Carbohydrates increase blood sugar levels.  Keep track of how  many calories you take in. Eating the right amount of calories will help you to achieve a healthy weight. Losing about 7 percent of your starting weight can help to prevent type 2 diabetes.  You may want to follow a Mediterranean diet. This diet includes a lot of vegetables, lean meats or fish, whole grains, fruits, and healthy oils and fats. WHAT FOODS CAN I EAT? Grains Whole grains, such as whole-wheat or whole-grain breads, crackers, cereals, and pasta. Unsweetened oatmeal. Bulgur. Barley. Quinoa. Brown rice. Corn or whole-wheat  flour tortillas or taco shells. Vegetables Lettuce. Spinach. Peas. Beets. Cauliflower. Cabbage. Broccoli. Carrots. Tomatoes. Squash. Eggplant. Herbs. Peppers. Onions. Cucumbers. Brussels sprouts. Fruits Berries. Bananas. Apples. Oranges. Grapes. Papaya. Mango. Pomegranate. Kiwi. Grapefruit. Cherries. Meats and Other Protein Sources Seafood. Lean meats, such as chicken and [place] or lean cuts of pork and beef. Tofu. Eggs. Nuts. Beans. Dairy Low-fat or fat-free dairy products, such as yogurt, cottage cheese, and cheese. Beverages Water. Tea. Coffee. Sugar-free or diet soda. Seltzer water. Milk. Milk alternatives, such as soy or almond milk. Condiments Mustard. Relish. Low-fat, low-sugar ketchup. Low-fat, low-sugar barbecue sauce. Low-fat or fat-free mayonnaise. Sweets and Desserts Sugar-free or low-fat pudding. Sugar-free or low-fat ice cream and other frozen treats. Fats and Oils Avocado. Walnuts. Olive oil. The items listed above may not be a complete list of recommended foods or beverages. Contact your dietitian for more options.  WHAT FOODS ARE NOT RECOMMENDED? Grains Refined white flour and flour products, such as bread, pasta, snack foods, and cereals. Beverages Sweetened drinks, such as sweet iced tea and soda. Sweets and Desserts Baked goods, such as cake, cupcakes, pastries, cookies, and cheesecake. The items listed above may not be a complete list of  foods and beverages to avoid. Contact your dietitian for more information.   This information is not intended to replace advice given to you by your health care provider. Make sure you discuss any questions you have with your health care provider.   Document Released: 04/15/2015 Document Reviewed: 04/15/2015 Elsevier Interactive Patient Education [employer]

## 2016-01-16 LAB — CMP14+EGFR
ALBUMIN: 4.1 g/dL (ref 3.5–5.5)
ALT: 47 IU/L — ABNORMAL HIGH (ref 0–44)
AST: 27 IU/L (ref 0–40)
Albumin/Globulin Ratio: 1.4 (ref 1.1–2.5)
Alkaline Phosphatase: 79 IU/L (ref 39–117)
BUN / CREAT RATIO: 12 (ref 9–20)
BUN: 11 mg/dL (ref 6–24)
Bilirubin Total: 0.7 mg/dL (ref 0.0–1.2)
CALCIUM: 9.5 mg/dL (ref 8.7–10.2)
CO2: 24 mmol/L (ref 18–29)
Chloride: 103 mmol/L (ref 96–106)
Creatinine, Ser: 0.95 mg/dL (ref 0.76–1.27)
GFR calc Af Amer: 111 mL/min/{1.73_m2} (ref >59)
GFR, EST NON AFRICAN AMERICAN: 96 mL/min/{1.73_m2} (ref >59)
GLOBULIN, TOTAL: 3 g/dL (ref 1.5–4.5)
Glucose: 97 mg/dL (ref 65–99)
Potassium: 4.8 mmol/L (ref 3.5–5.2)
SODIUM: 143 mmol/L (ref 134–144)
Total Protein: 7.1 g/dL (ref 6.0–8.5)

## 2016-01-16 LAB — URIC ACID: Uric Acid: 6.5 mg/dL (ref 3.7–8.6)

## 2016-01-16 LAB — LIPID PANEL
CHOL/HDL RATIO: 3.3 ratio (ref 0.0–5.0)
Cholesterol, Total: 114 mg/dL (ref 100–199)
HDL: 35 mg/dL — ABNORMAL LOW (ref >39)
LDL CALC: 67 mg/dL (ref 0–99)
TRIGLYCERIDES: 61 mg/dL (ref 0–149)
VLDL CHOLESTEROL CAL: 12 mg/dL (ref 5–40)

## 2016-01-16 LAB — MICROALBUMIN / CREATININE URINE RATIO
Creatinine, Urine: 149.3 mg/dL
MICROALB/CREAT RATIO: 26.1 mg/g creat (ref 0.0–30.0)
MICROALBUM., U, RANDOM: 38.9 ug/mL

## 2016-01-16 LAB — VITAMIN D 25 HYDROXY (VIT D DEFICIENCY, FRACTURES): Vit D, 25-Hydroxy: 43 ng/mL (ref 30.0–100.0)

## 2016-02-23 ENCOUNTER — Ambulatory Visit (INDEPENDENT_AMBULATORY_CARE_PROVIDER_SITE_OTHER): Payer: BC Managed Care – PPO | Admitting: Family

## 2016-02-23 ENCOUNTER — Encounter: Payer: Self-pay | Admitting: Family

## 2016-02-23 VITALS — BP 128/73 | HR 72 | Temp 96.9°F | Ht 67.0 in | Wt 380.2 lb

## 2016-02-23 DIAGNOSIS — J069 Acute upper respiratory infection, unspecified: Secondary | ICD-10-CM | POA: Diagnosis not present

## 2016-02-23 MED ORDER — AMOXICILLIN-POT CLAVULANATE 875-125 MG PO TABS: 1.0000 | ORAL_TABLET | Freq: Two times a day (BID) | ORAL | Status: DC

## 2016-02-23 MED ORDER — AZITHROMYCIN 250 MG PO TABS: ORAL_TABLET | ORAL | Status: DC

## 2016-02-23 NOTE — Patient Instructions (Signed)
Upper Respiratory Infection, Adult Most upper respiratory infections (URIs) are a viral infection of the air passages leading to the lungs. A URI affects the nose, throat, and upper air passages. The most common type of URI is nasopharyngitis and is typically referred to as "the common cold." URIs run their course and usually go away on their own. Most of the time, a URI does not require medical attention, but sometimes a bacterial infection in the upper airways can follow a viral infection. This is called a secondary infection. Sinus and middle ear infections are common types of secondary upper respiratory infections. Bacterial pneumonia can also complicate a URI. A URI can worsen asthma and chronic obstructive pulmonary disease (COPD). Sometimes, these complications can require emergency medical care and may be life threatening.  CAUSES Almost all URIs are caused by viruses. A virus is a type of germ and can spread from one person to another.  RISKS FACTORS You may be at risk for a URI if:   You smoke.   You have chronic heart or lung disease.  You have a weakened defense (immune) system.   You are very young or very old.   You have nasal allergies or asthma.  You work in crowded or poorly ventilated areas.  You work in health care facilities or schools. SIGNS AND SYMPTOMS  Symptoms typically develop 2-3 days after you come in contact with a cold virus. Most viral URIs last 7-10 days. However, viral URIs from the influenza virus (flu virus) can last 14-18 days and are typically more severe. Symptoms may include:   Runny or stuffy (congested) nose.   Sneezing.   Cough.   Sore throat.   Headache.   Fatigue.   Fever.   Loss of appetite.   Pain in your forehead, behind your eyes, and over your cheekbones (sinus pain).  Muscle aches.  DIAGNOSIS  Your health care provider may diagnose a URI by:  Physical exam.  Tests to check that your symptoms are not due to  another condition such as:  Strep throat.  Sinusitis.  Pneumonia.  Asthma. TREATMENT  A URI goes away on its own with time. It cannot be cured with medicines, but medicines may be prescribed or recommended to relieve symptoms. Medicines may help:  Reduce your fever.  Reduce your cough.  Relieve nasal congestion. HOME CARE INSTRUCTIONS   Take medicines only as directed by your health care provider.   Gargle warm saltwater or take cough drops to comfort your throat as directed by your health care provider.  Use a warm mist humidifier or inhale steam from a shower to increase air moisture. This may make it easier to breathe.  Drink enough fluid to keep your urine clear or pale yellow.   Eat soups and other clear broths and maintain good nutrition.   Rest as needed.   Return to work when your temperature has returned to normal or as your health care provider advises. You may need to stay home longer to avoid infecting others. You can also use a face mask and careful hand washing to prevent spread of the virus.  Increase the usage of your inhaler if you have asthma.   Do not use any tobacco products, including cigarettes, chewing tobacco, or electronic cigarettes. If you need help quitting, ask your health care provider. PREVENTION  The best way to protect yourself from getting a cold is to practice good hygiene.   Avoid oral or hand contact with people with cold   symptoms.   Wash your hands often if contact occurs.  There is no clear evidence that vitamin C, vitamin E, echinacea, or exercise reduces the chance of developing a cold. However, it is always recommended to get plenty of rest, exercise, and practice good nutrition.  SEEK MEDICAL CARE IF:   You are getting worse rather than better.   Your symptoms are not controlled by medicine.   You have chills.  You have worsening shortness of breath.  You have brown or red mucus.  You have yellow or brown nasal  discharge.  You have pain in your face, especially when you bend forward.  You have a fever.  You have swollen neck glands.  You have pain while swallowing.  You have white areas in the back of your throat. SEEK IMMEDIATE MEDICAL CARE IF:   You have severe or persistent:  Headache.  Ear pain.  Sinus pain.  Chest pain.  You have chronic lung disease and any of the following:  Wheezing.  Prolonged cough.  Coughing up blood.  A change in your usual mucus.  You have a stiff neck.  You have changes in your:  Vision.  Hearing.  Thinking.  Mood. MAKE SURE YOU:   Understand these instructions.  Will watch your condition.  Will get help right away if you are not doing well or get worse.   This information is not intended to replace advice given to you by your health care provider. Make sure you discuss any questions you have with your health care provider.   Document Released: 05/25/2001 Document Revised: 04/15/2015 Document Reviewed: 03/06/2014  - Take meds as prescribed - Use a cool mist humidifier  -Use saline nose sprays frequently -Saline irrigations of the nose can be very helpful if done frequently.  * 4X daily for 1 week*  * Use of a nettie pot can be helpful with this. Follow directions with this* -Force fluids -For any cough or congestion  Use plain Mucinex- regular strength or max strength is fine   * Children- consult with Pharmacist for dosing -For fever or aces or pains- take tylenol or ibuprofen appropriate for age and weight.  * for fevers greater than 101 orally you may alternate ibuprofen and tylenol every  3 hours. -Throat lozenges if help -New toothbrush in 3 days   Alex Rodneyhristy Marton Malizia, FNP  Elsevier Interactive Patient Education [employer]2016 [employer]

## 2016-02-23 NOTE — Progress Notes (Signed)
Subjective:    Patient ID: Alex Harrison, male    DOB: 05/02/1971, 45 y.o.   MRN: [id number]  Sinus Problem This is a new problem. The current episode started 1 to 4 weeks ago. The problem has been waxing and waning since onset. There has been no fever. His pain is at a severity of 6/10. The pain is moderate. Associated symptoms include congestion, coughing, ear pain, a hoarse voice, sinus pressure and a sore throat. Pertinent negatives include no chills, headaches, shortness of breath or sneezing. Past treatments include acetaminophen and oral decongestants. The treatment provided mild relief.  Sore Throat  Associated symptoms include congestion, coughing, ear pain and a hoarse voice. Pertinent negatives include no headaches or shortness of breath.      Review of Systems  Constitutional: Negative.  Negative for chills.  HENT: Positive for congestion, ear pain, hoarse voice, sinus pressure and sore throat. Negative for sneezing.   Respiratory: Positive for cough. Negative for shortness of breath.   Cardiovascular: Negative.   Gastrointestinal: Negative.   Endocrine: Negative.   Genitourinary: Negative.   Musculoskeletal: Negative.   Neurological: Negative.  Negative for headaches.  Hematological: Negative.   Psychiatric/Behavioral: Negative.   All other systems reviewed and are negative.      Objective:   Physical Exam  Constitutional: He is oriented to person, place, and time. He appears well-developed and well-nourished. No distress.  HENT:  Head: Normocephalic.  Right Ear: External ear normal.  Left Ear: External ear normal.  Nasal passage erythemas with mild swelling  Oropharynx erythemas- tonsils 2+  Eyes: Pupils are equal, round, and reactive to light. Right eye exhibits no discharge. Left eye exhibits no discharge.  Neck: Normal range of motion. Neck supple. No thyromegaly present.  Cardiovascular: Normal rate, regular rhythm, normal heart sounds and intact distal  pulses.   No murmur heard. Pulmonary/Chest: Effort normal and breath sounds normal. No respiratory distress. He has no wheezes.  Abdominal: Soft. Bowel sounds are normal. He exhibits no distension. There is no tenderness.  Musculoskeletal: Normal range of motion. He exhibits no edema or tenderness.  Neurological: He is alert and oriented to person, place, and time. No cranial nerve deficit.  Skin: Skin is warm and dry. No rash noted. No erythema.  Psychiatric: He has a normal mood and affect. His behavior is normal. Judgment and thought content normal.  Vitals reviewed.   BP 128/73 mmHg  Pulse 72  Temp(Src) 96.9 F (36.1 C) (Oral)  Ht  (1.702 m)  Wt 380 lb 3.2 oz (172.458 kg)  BMI 59.53 kg/m2       Assessment & Plan:  1. Acute upper respiratory infection -- Take meds as prescribed - Use a cool mist humidifier  -Use saline nose sprays frequently -Saline irrigations of the nose can be very helpful if done frequently.  * 4X daily for 1 week*  * Use of a nettie pot can be helpful with this. Follow directions with this* -Force fluids -For any cough or congestion  Use plain Mucinex- regular strength or max strength is fine   * Children- consult with Pharmacist for dosing -For fever or aces or pains- take tylenol or ibuprofen appropriate for age and weight.  * for fevers greater than 101 orally you may alternate ibuprofen and tylenol every  3 hours. -Throat lozenges if help -New toothbrush in 3 days - azithromycin (ZITHROMAX Z-PAK) 250 MG tablet; Take 500 mg once, then 250 mg for four days  Dispense: 6 each;  Refill: 0  Jannifer Rodneyhristy Regnald Bowens, FNP  *Zpak d/c and augmentin ordered for patient because he states the Zpak gives him diarrhea.m

## 2016-03-04 ENCOUNTER — Telehealth: Payer: Self-pay | Admitting: Family

## 2016-03-05 MED ORDER — PREDNISONE 10 MG (21) PO TBPK: 10.0000 mg | ORAL_TABLET | Freq: Every day | ORAL | Status: DC

## 2016-03-05 NOTE — Telephone Encounter (Signed)
Prednisone dose pack Prescription sent to pharmacy   

## 2016-03-05 NOTE — Telephone Encounter (Signed)
Pt aware.

## 2016-07-14 ENCOUNTER — Ambulatory Visit: Payer: BC Managed Care – PPO | Admitting: Family

## 2016-11-01 ENCOUNTER — Encounter: Payer: BC Managed Care – PPO | Attending: Internal Medicine | Admitting: Nutrition

## 2016-11-01 VITALS — Ht 67.0 in | Wt 345.0 lb

## 2016-11-01 DIAGNOSIS — E119 Type 2 diabetes mellitus without complications: Secondary | ICD-10-CM

## 2016-11-01 DIAGNOSIS — Z713 Dietary counseling and surveillance: Secondary | ICD-10-CM | POA: Diagnosis present

## 2016-11-01 NOTE — Patient Instructions (Signed)
Goals 1. Follow Plate Method 2. Egg sandwich and fruit for breakfast 3. Drink gallon of water per day. 4. Walk or exercise 60 minutes 4 days a week. Lose 1-2 lbs per week No snacks between meals unless vegetables. Eat meals on time. Don't skip meals

## 2016-11-01 NOTE — Progress Notes (Signed)
  Medical Nutrition Therapy:  Appt start time: 1600 end time:  1700.   Assessment:  Primary concerns today: Obesity and Type 2 DM diet controlled.  LIves with his daughter. PMH: Hyperlipidemia, Htn  Diet contorlled Type 2 Dm.  Eats 3 meals per day. On weight loss drug of Saxenda 18 mg/d. He started it in September.  He has lost 20 lbs since September. Changes recenly made: Cut out fried foods, soda and fast foods. Walks with his sister 4-5 times per week. Wants to lose weight on his own instead of bariatric surgery. Not testing blood sugars and not on any meds at this time. A1C was 5.7% he reports in June. No labs for review   He is walking some and more mindful of what he is eating. Wants to get down to about 200 lbs.    No preference indicated   Learning Readiness:  Ready  Change in progress  MEDICATIONS:    DIETARY INTAKE:   24-hr recall:  B ( AM): PIzza frozen- smaller one,  Snk ( AM): water  L ( PM): (Hasn't eaten yet) Snk ( PM): D ( PM): Small frozen pizza for dinner, water Snk ( PM):  Beverages: water  Usual physical activity: walking.  Estimated energy needs: 1800  calories 200  g carbohydrates 135 g protein 50 g fat  Progress Towards Goal(s):  In progress.   Nutritional Diagnosis:  NI-1.5 Excessive energy intake As related to high fat high salt high sugar diet.  As evidenced by BMI > 40.    Intervention: Nutrition and Diabetes education provided on My Plate, CHO counting, meal planning, portion sizes, timing of meals, avoiding snacks between meals unless having a low blood sugar, target ranges for A1C and blood sugars, signs/symptoms and treatment of hyper/hypoglycemia, monitoring blood sugars, taking medications as prescribed, benefits of exercising 30 minutes per day and prevention of complications of DM. Healthy Weight loss tips, benefit of exercise, portion sizes, drinking water and need for consistency of timing of meals. Avoiding skipping meals.    Goals 1.  Follow Plate Method 2. Egg sandwich and fruit for breakfast 3. Drink gallon of water per day. 4. Walk or exercise 60 minutes 4 days a week. Lose 1-2 lbs per week No snacks between meals unless vegetables. Eat meals on time. Don't skip meals   Teaching Method Utilized:  Visual Auditory Hands on  Handouts given during visit include:  The Plate Method   Meal Plan Card   Barriers to learning/adherence to lifestyle change: none  Demonstrated degree of understanding via:  Teach Back   Monitoring/Evaluation:  Dietary intake, exercise, meal planning, SBG, and body weight in 1 month(s).

## 2016-11-02 ENCOUNTER — Encounter: Payer: Self-pay | Admitting: Nutrition

## 2016-11-02 ENCOUNTER — Telehealth: Payer: Self-pay | Admitting: Family

## 2016-11-17 ENCOUNTER — Other Ambulatory Visit: Payer: Self-pay | Admitting: Family

## 2016-11-17 DIAGNOSIS — M109 Gout, unspecified: Secondary | ICD-10-CM

## 2016-11-25 ENCOUNTER — Other Ambulatory Visit: Payer: Self-pay | Admitting: Family

## 2016-11-25 DIAGNOSIS — M109 Gout, unspecified: Secondary | ICD-10-CM

## 2017-02-02 ENCOUNTER — Encounter: Payer: BC Managed Care – PPO | Attending: Family | Admitting: Nutrition

## 2017-02-02 VITALS — Ht 67.0 in | Wt 326.0 lb

## 2017-02-02 DIAGNOSIS — Z713 Dietary counseling and surveillance: Secondary | ICD-10-CM | POA: Insufficient documentation

## 2017-02-02 DIAGNOSIS — E119 Type 2 diabetes mellitus without complications: Secondary | ICD-10-CM

## 2017-02-02 NOTE — Patient Instructions (Signed)
Goals 1. Increase walking to 5 times per week 2. Cut out sweets 3. Increase fresh fruits and vegetables. 4. Keep drinking water 5. Lose 1-2 lb per week Keep A1C less than 5.7%.

## 2017-02-02 NOTE — Progress Notes (Signed)
  Medical Nutrition Therapy:  Appt start time: 1600 end time:  1700.   Assessment:  Primary concerns today: Obesity and Type 2 DM diet controlled. No longer on meds for DM. Changed: has been walking, he has cut back on sweets, cut down on portions, eating more healthier foods, still skipping meals still at times, but trying to eating more vegetables. Drinking water. Still taking Saxenda. Walking 3-4 times per week.   Diet needs more lower carb vegetables and fresh fruits and cut out sugared cereals and sweets. Doing great walking for exercise.    A1C 5.5%. Sees [facility] in [place] for PCP. Lost 54 lbs in the last year; 18 lbs in the last 3 months. Feels better.Marland Kitchen. Has more energy. Making great progress.    No preference indicated   Learning Readiness:  Ready  Change in progress  MEDICATIONS:    DIETARY INTAKE:   24-hr recall:  B ( AM): Egg sandwich, water Snk ( AM): water  L ( PM): Chicken and dumplings, water Snk ( PM):carrots, D ( PM):  Cereal or meat and vegetables;water Snk ( PM):  Beverages: water  Usual physical activity: walking.  Estimated energy needs: 1800  calories 200  g carbohydrates 135 g protein 50 g fat  Progress Towards Goal(s):  In progress.   Nutritional Diagnosis:  NI-1.5 Excessive energy intake As related to high fat high salt high sugar diet.  As evidenced by BMI > 40.    Intervention: Nutrition and Diabetes education provided on My Plate, CHO counting, meal planning, portion sizes, timing of meals, avoiding snacks between meals unless having a low blood sugar, target ranges for A1C and blood sugars, signs/symptoms and treatment of hyper/hypoglycemia, monitoring blood sugars, taking medications as prescribed, benefits of exercising 30 minutes per day and prevention of complications of DM. Healthy Weight loss tips, benefit of exercise, portion sizes, drinking water and need for consistency of timing of meals. Avoiding skipping meals.     Goals 1. Increase walking to 5 times per week 2. Cut out sweets 3. Increase fresh fruits and vegetables. 4. Keep drinking water 5. Lose 1-2 lb per week Keep A1C less than 5.7%.   Teaching Method Utilized:  Visual Auditory Hands on  Handouts given during visit include:  The Plate Method   Meal Plan Card   Barriers to learning/adherence to lifestyle change: none  Demonstrated degree of understanding via:  Teach Back   Monitoring/Evaluation:  Dietary intake, exercise, meal planning, SBG, and body weight in 3 month(s).

## 2017-05-04 ENCOUNTER — Ambulatory Visit: Payer: BC Managed Care – PPO | Admitting: Nutrition

## 2017-05-11 ENCOUNTER — Encounter: Payer: BC Managed Care – PPO | Attending: Nurse Practitioner | Admitting: Nutrition

## 2017-05-11 DIAGNOSIS — Z713 Dietary counseling and surveillance: Secondary | ICD-10-CM | POA: Diagnosis present

## 2017-05-11 DIAGNOSIS — Z6841 Body Mass Index (BMI) 40.0 and over, adult: Secondary | ICD-10-CM | POA: Insufficient documentation

## 2017-05-11 DIAGNOSIS — E119 Type 2 diabetes mellitus without complications: Secondary | ICD-10-CM

## 2017-05-11 NOTE — Progress Notes (Signed)
  Medical Nutrition Therapy:  Appt start time: 1600 end time:  1630   Assessment:  Primary concerns today: Obesity and Type 2 DM diet controlled.   Lost 18 lbs in the last 3 months. Cut out sodas, sweets, snacks. Eating more vegetables and eating more grilled foods. Not using oil much anymore.  Is on Saxenda daily for weight loss. Feels better. Has more energy. Needs to find a friend to walk with so he can walk more.  Learning preference:   No preference indicated   Learning Readiness:  Ready  Change in progress  MEDICATIONS:    DIETARY INTAKE:   24-hr recall:  B ( AM): leftover or egg sandwich, water Snk ( AM): water  L ( PM): Chicken tenders, mashed potatoes and gravy, SF lemonade Snk ( PM):carrots, D ( PM):  Skipped:  Grilled ribs,  Corn on cob, water Snk ( PM):  Beverages: water  Usual physical activity: walking.3 miles 2-3 times per week.  Estimated energy needs: 1800  calories 200  g carbohydrates 135 g protein 50 g fat  Progress Towards Goal(s):  In progress.   Nutritional Diagnosis:  NI-1.5 Excessive energy intake As related to high fat high salt high sugar diet.  As evidenced by BMI > 40.    Intervention: Nutrition and Diabetes education provided on My Plate, CHO counting, meal planning, portion sizes, timing of meals, avoiding snacks between meals unless having a low blood sugar, target ranges for A1C and blood sugars, signs/symptoms and treatment of hyper/hypoglycemia, monitoring blood sugars, taking medications as prescribed, benefits of exercising 30 minutes per day and prevention of complications of DM. Healthy Weight loss tips, benefit of exercise, portion sizes, drinking water and need for consistency of timing of meals. Avoiding skipping meals.    .Goals 1.  Don't skip meals 2. Increase walking to 3 times a week--find a walking partner. 3. Increase more vegetables.  Lose 3-4 lbs per month Get lab done in July.    Teaching Method Utilized:   Visual Auditory Hands on  Handouts given during visit include:  The Plate Method   Meal Plan Card   Barriers to learning/adherence to lifestyle change: none  Demonstrated degree of understanding via:  Teach Back   Monitoring/Evaluation:  Dietary intake, exercise, meal planning, SBG, and body weight in 3 month(s).

## 2017-05-11 NOTE — Patient Instructions (Addendum)
Goals 1.  Don't skip meals 2. Increase walking to 3 times a week--find a walking partner. 3. Increase more vegetables.  Lose 3-4 lbs per month Get lab done in July.

## 2017-08-17 ENCOUNTER — Ambulatory Visit: Payer: BC Managed Care – PPO | Admitting: Nutrition

## 2017-08-22 ENCOUNTER — Other Ambulatory Visit: Payer: Self-pay

## 2017-08-22 ENCOUNTER — Encounter: Payer: BC Managed Care – PPO | Attending: Nurse Practitioner | Admitting: Nutrition

## 2017-08-22 DIAGNOSIS — Z6841 Body Mass Index (BMI) 40.0 and over, adult: Secondary | ICD-10-CM | POA: Diagnosis not present

## 2017-08-22 DIAGNOSIS — Z713 Dietary counseling and surveillance: Secondary | ICD-10-CM | POA: Diagnosis present

## 2017-08-22 DIAGNOSIS — E119 Type 2 diabetes mellitus without complications: Secondary | ICD-10-CM

## 2017-08-22 DIAGNOSIS — E7800 Pure hypercholesterolemia, unspecified: Secondary | ICD-10-CM

## 2017-08-22 NOTE — Progress Notes (Signed)
  Medical Nutrition Therapy:  Appt start time: 1600 end time:  1630   Assessment:  Primary concerns today: Obesity and Type 2 DM diet controlled.    He has been eating better now. Eating more vegetables. Trying to not skip meals but sometimes he isn't hungry.  Lost 18 lbs in the last  3 months. He goes back to see PCP Ms. Christell ConstantMoore, Sept 28th. No new labs yet.  On Saxenda daily. Has lost 56 lbs over the last year. Hasn't been exercising much.    Learning preference:   No preference indicated   Learning Readiness:  Ready  Change in progress  MEDICATIONS:    DIETARY INTAKE:   24-hr recall:  B ( AM): 2 eggs and 1 slice ham  Snk ( AM): water  L ( PM): skipped Snk ( PM):carrots, D ( PM): Liver, spinach, rice, water Snk ( PM):  Beverages: water  Usual physical activity: walking.3 miles 2-3 times per week.  Estimated energy needs: 1800  calories 200  g carbohydrates 135 g protein 50 g fat  Progress Towards Goal(s):  In progress.   Nutritional Diagnosis:  NI-1.5 Excessive energy intake As related to high fat high salt high sugar diet.  As evidenced by BMI > 40.    Intervention: Nutrition and Diabetes education provided on My Plate, CHO counting, meal planning, portion sizes, timing of meals, avoiding snacks between meals unless having a low blood sugar, target ranges for A1C and blood sugars, signs/symptoms and treatment of hyper/hypoglycemia, monitoring blood sugars, taking medications as prescribed, benefits of exercising 30 minutes per day and prevention of complications of DM. Healthy Weight loss tips, benefit of exercise, portion sizes, drinking water and need for consistency of timing of meals. Avoiding skipping meals.   Goals Keep up the great job!!!   1. Eat 2 slices toast or bowl of cereal un sugared with eggs for breakfast  2. Don't skip meals.  3, Walk 30 minutes 4-5 times per week. Lose 1-2 lbs per week Increase fresh fruits and vegetables    Teaching Method  Utilized:  Visual Auditory Hands on  Handouts given during visit include:  The Plate Method   Meal Plan Card   Barriers to learning/adherence to lifestyle change: none  Demonstrated degree of understanding via:  Teach Back   Monitoring/Evaluation:  Dietary intake, exercise, meal planning, SBG, and body weight in 6 month(s).

## 2017-08-22 NOTE — Patient Instructions (Addendum)
Goals Keep up the great job!!!   1. Eat 2 slices toast or bowl of cereal un sugared with eggs for breakfast  2. Don't skip meals.  3, Walk 30 minutes 4-5 times per week. Lose 1-2 lbs per week Increase fresh fruits and vegetables.

## 2018-02-20 ENCOUNTER — Encounter: Payer: BC Managed Care – PPO | Attending: Nurse Practitioner | Admitting: Nutrition

## 2018-02-20 VITALS — Ht 67.0 in | Wt 285.0 lb

## 2018-02-20 DIAGNOSIS — Z6841 Body Mass Index (BMI) 40.0 and over, adult: Secondary | ICD-10-CM | POA: Insufficient documentation

## 2018-02-20 DIAGNOSIS — Z713 Dietary counseling and surveillance: Secondary | ICD-10-CM | POA: Insufficient documentation

## 2018-02-20 DIAGNOSIS — E669 Obesity, unspecified: Secondary | ICD-10-CM

## 2018-02-20 DIAGNOSIS — R739 Hyperglycemia, unspecified: Secondary | ICD-10-CM

## 2018-02-20 NOTE — Patient Instructions (Signed)
Goals 1. Increase vegateables to eat 2 with lunch and dinner daily. 2. Exercise walking  3 miles 5 days a week. 3. Don't skip meals. 4. Watch portions. Lose 2-3 lbs per month.

## 2018-02-20 NOTE — Progress Notes (Signed)
  Medical Nutrition Therapy:  Appt start time: 1600 end time:  1630   Assessment:  Primary concerns today: Obesity and Type 2 DM diet controlled.  Eating three meals per day but notes he needs to get back to walking more. Feels better and not as thirsty or tired as much. Has cut out sodas and drinking more water. NOt skipping meal anymore. A1C 5.5%,Still taking Sexanda daily. Lost 4 lbs in 6 months.  Lab Results  Component Value Date   HGBA1C 6.0 01/15/2016     Learning preference:   No preference indicated   Learning Readiness:  Ready  Change in progress  MEDICATIONS:    DIETARY INTAKE:   24-hr recall:  B ( AM): Egg sandwich, water Snk ( AM): water  L ( PM):  Rice and chicken, water Snk ( PM):candy small piece D ( PM): Spaghetti and ground [place] and water Snk ( PM):  Beverages: water  Usual physical activity: walking.3 miles 2-3 times per week.  Estimated energy needs: 1800  calories 200  g carbohydrates 135 g protein 50 g fat  Progress Towards Goal(s):  In progress.   Nutritional Diagnosis:  NI-1.5 Excessive energy intake As related to high fat high salt high sugar diet.  As evidenced by BMI > 40.    Intervention: Nutrition and Diabetes education provided on My Plate, CHO counting, meal planning, portion sizes, timing of meals, avoiding snacks between meals unless having a low blood sugar, target ranges for A1C and blood sugars, signs/symptoms and treatment of hyper/hypoglycemia, monitoring blood sugars, taking medications as prescribed, benefits of exercising 30 minutes per day and prevention of complications of DM. Healthy Weight loss tips, benefit of exercise, portion sizes, drinking water and need for consistency of timing of meals. Avoiding skipping meals.   Goals Keep up the great job!!! Goals 1. Increase vegateables to eat 2 with lunch and dinner daily. 2. Exercise walking  3 miles 5 days a week. 3. Don't skip meals. 4. Watch portions. Lose 2-3 lbs  per month.  Teaching Method Utilized:  Visual Auditory Hands on  Handouts given during visit include:  The Plate Method   Meal Plan Card   Barriers to learning/adherence to lifestyle change: none  Demonstrated degree of understanding via:  Teach Back   Monitoring/Evaluation:  Dietary intake, exercise, meal planning, SBG, and body weight in 6 month(s).

## 2018-02-22 ENCOUNTER — Other Ambulatory Visit: Payer: Self-pay | Admitting: Nurse Practitioner

## 2018-02-22 ENCOUNTER — Encounter: Payer: Self-pay | Admitting: Nutrition

## 2018-02-22 DIAGNOSIS — Z87442 Personal history of urinary calculi: Secondary | ICD-10-CM

## 2018-02-24 ENCOUNTER — Ambulatory Visit
Admission: RE | Admit: 2018-02-24 | Discharge: 2018-02-24 | Disposition: A | Discharge status: Home / Self Care | Payer: BC Managed Care – PPO | Source: Ambulatory Visit | Attending: Nurse Practitioner | Admitting: Nurse Practitioner

## 2018-02-24 DIAGNOSIS — Z87442 Personal history of urinary calculi: Secondary | ICD-10-CM

## 2018-08-06 ENCOUNTER — Emergency Department (HOSPITAL_COMMUNITY): Payer: BC Managed Care – PPO

## 2018-08-06 ENCOUNTER — Other Ambulatory Visit: Payer: Self-pay

## 2018-08-06 ENCOUNTER — Encounter (HOSPITAL_COMMUNITY): Payer: Self-pay

## 2018-08-06 ENCOUNTER — Emergency Department (HOSPITAL_COMMUNITY)
Admission: EM | Admit: 2018-08-06 | Discharge: 2018-08-06 | Disposition: A | Discharge status: Home / Self Care | Payer: BC Managed Care – PPO | Attending: Emergency Medicine | Admitting: Emergency Medicine

## 2018-08-06 DIAGNOSIS — R1084 Generalized abdominal pain: Secondary | ICD-10-CM | POA: Diagnosis present

## 2018-08-06 DIAGNOSIS — Z79899 Other long term (current) drug therapy: Secondary | ICD-10-CM | POA: Insufficient documentation

## 2018-08-06 DIAGNOSIS — N201 Calculus of ureter: Secondary | ICD-10-CM | POA: Diagnosis not present

## 2018-08-06 DIAGNOSIS — I10 Essential (primary) hypertension: Secondary | ICD-10-CM | POA: Diagnosis not present

## 2018-08-06 LAB — BASIC METABOLIC PANEL
Anion gap: 8 (ref 5–15)
BUN: 12 mg/dL (ref 6–20)
CALCIUM: 9.5 mg/dL (ref 8.9–10.3)
CHLORIDE: 106 mmol/L (ref 98–111)
CO2: 28 mmol/L (ref 22–32)
CREATININE: 1.1 mg/dL (ref 0.61–1.24)
GFR calc Af Amer: >60 mL/min (ref >60)
GFR calc non Af Amer: >60 mL/min (ref >60)
Glucose, Bld: 98 mg/dL (ref 70–99)
Potassium: 4.1 mmol/L (ref 3.5–5.1)
SODIUM: 142 mmol/L (ref 135–145)

## 2018-08-06 LAB — URINALYSIS, ROUTINE W REFLEX MICROSCOPIC
BACTERIA UA: NONE SEEN
BILIRUBIN URINE: NEGATIVE
Glucose, UA: NEGATIVE mg/dL
KETONES UR: NEGATIVE mg/dL
LEUKOCYTES UA: NEGATIVE
NITRITE: NEGATIVE
Protein, ur: NEGATIVE mg/dL
SPECIFIC GRAVITY, URINE: 1.014 (ref 1.005–1.030)
pH: 6 (ref 5.0–8.0)

## 2018-08-06 MED ORDER — ONDANSETRON HCL 4 MG/2ML IJ SOLN
4.0000 mg | Freq: Once | INTRAMUSCULAR | Status: AC
  Administered 2018-08-06: 4 mg via INTRAVENOUS
  Filled 2018-08-06: qty 2

## 2018-08-06 MED ORDER — TAMSULOSIN HCL 0.4 MG PO CAPS
0.4000 mg | ORAL_CAPSULE | Freq: Once | ORAL | Status: AC
  Administered 2018-08-06: 0.4 mg via ORAL
  Filled 2018-08-06: qty 1

## 2018-08-06 MED ORDER — KETOROLAC TROMETHAMINE 30 MG/ML IJ SOLN
30.0000 mg | Freq: Once | INTRAMUSCULAR | Status: AC
  Administered 2018-08-06: 30 mg via INTRAVENOUS
  Filled 2018-08-06: qty 1

## 2018-08-06 MED ORDER — ONDANSETRON HCL 4 MG PO TABS: 4.0000 mg | ORAL_TABLET | Freq: Three times a day (TID) | ORAL | 0 refills | Status: DC | PRN

## 2018-08-06 MED ORDER — OXYCODONE-ACETAMINOPHEN 5-325 MG PO TABS: 1.0000 | ORAL_TABLET | Freq: Four times a day (QID) | ORAL | 0 refills | Status: DC | PRN

## 2018-08-06 MED ORDER — TAMSULOSIN HCL 0.4 MG PO CAPS: ORAL_CAPSULE | ORAL | 0 refills | Status: DC

## 2018-08-06 NOTE — Discharge Instructions (Addendum)
Drink plenty of fluids. Take the medications as prescribed. You can also take ibuprofen 600 mg 4 times a day for pain. Return to the ED if you get fever or have uncontrolled vomiting or pain.

## 2018-08-06 NOTE — ED Triage Notes (Signed)
Pt reports right sided flank pain that woke him from sleep tonight. Reports nausea without vomiting. Pt has hx of kidney stones. Decreased urinary output.

## 2018-08-06 NOTE — ED Provider Notes (Signed)
[facility] EMERGENCY DEPARTMENT Provider Note   CSN: [id number] Arrival date & time: 08/06/18  4098  Time seen 5:08 AM  History   Chief Complaint Chief Complaint  Patient presents with  . Flank Pain    right side    HPI Alex Harrison is a 47 y.o. male.  HPI patient reports a history of kidney stones in the past.  He states he did see a urologist for 5 years ago.  He states he was put on some medication to help him pass the stones but he does not know what it was.  He states he was awakened at 2 AM with right flank pain that radiates into his right lower quadrant.  The pain is been there constantly.  He describes it as sharp and aching.  He has had nausea without vomiting, hematuria.  He states he is having no difficulty urinating and after the pain started he did urinate and have a bowel movement and the pain was a little bit better.  Nothing makes the pain hurt more, nothing else makes the pain feel better.  He states he got on his knees and was laying on the floor without relief of pain.  He did not try any medications.  He states he is supposed to be referred to a urologist as discussed with his primary care doctor few months ago but has not gotten an appointment yet.  PCP [name]   Past Medical History:  Diagnosis Date  . Allergic rhinitis   . Allergy    seasonal allergic rhinitis  . Diabetes mellitus without complication (HCC)   . Gout   . Hyperlipidemia   . Hypertension   . Left arm pain   . Left elbow pain   . Morbid obesity (HCC)   . Neck nodule 09/16/10   right     Patient Active Problem List   Diagnosis Date Noted  . Metabolic syndrome 01/15/2016  . Prediabetes 01/15/2016  . Hyperglycemia 04/08/2015  . Pes planus of both feet 03/04/2014  . Allergy   . Vitamin D deficiency 07/26/2013  . HLD (hyperlipidemia) 04/24/2013  . Obesity 04/24/2013  . Gout 04/24/2013    History reviewed. No pertinent surgical history.      Home Medications    Prior to  Admission medications   Medication Sig Start Date End Date Taking? Authorizing Provider  allopurinol (ZYLOPRIM) 300 MG tablet TAKE ONE TABLET BY MOUTH ONCE DAILY 11/25/16   Jannifer Rodney A, FNP  amoxicillin-clavulanate (AUGMENTIN) 875-125 MG tablet Take 1 tablet by mouth 2 (two) times daily. 02/23/16   Junie Spencer, FNP  atorvastatin (LIPITOR) 40 MG tablet Take 1 tablet (40 mg total) by mouth daily. 01/15/16   Jannifer Rodney A, FNP  Cholecalciferol (VITAMIN D-3) 5000 UNITS TABS Take by mouth.      [provider]  Liraglutide -Weight Management (SAXENDA) 18 MG/3ML SOPN Inject 18 mg/day into the skin.    [provider]  Omega-3 Fatty Acids (FISH OIL PO) Take by mouth.    [provider]  ondansetron (ZOFRAN) 4 MG tablet Take 1 tablet (4 mg total) by mouth every 8 (eight) hours as needed. 08/06/18   Devoria Albe, MD  oxyCODONE-acetaminophen (PERCOCET/ROXICET) 5-325 MG tablet Take 1 tablet by mouth every 6 (six) hours as needed for severe pain. 08/06/18   Devoria Albe, MD  predniSONE (STERAPRED UNI-PAK 21 TAB) 10 MG (21) TBPK tablet Take 1 tablet (10 mg total) by mouth daily. As directed x 6  days Patient not taking: Reported on 11/01/2016 03/05/16   Jannifer Rodney A, FNP  tamsulosin (FLOMAX) 0.4 MG CAPS capsule Take 1 po QD until you pass the stone. 08/06/18   Devoria Albe, MD    Family History Family History  Problem Relation Age of Onset  . Hypertension Mother   . Diabetes Mother   . Stroke Mother   . Hypertension Father   . Diabetes Father   . Diabetes Sister   . Hypertension Sister     Social History Social History   Tobacco Use  . Smoking status: Never Smoker  . Smokeless tobacco: Never Used  Substance Use Topics  . Alcohol use: No  . Drug use: No  employed with [employer]   Allergies   Niaspan [niacin er]   Review of Systems Review of Systems  All other systems reviewed and are negative.    Physical Exam Updated Vital Signs BP 120/88 (BP Location:  Left Arm)   Pulse 84   Temp 98.6 F (37 C) (Oral)   Resp 18   Ht 5\' 8"  (1.727 m)   Wt 127 kg   SpO2 100%   BMI 42.57 kg/m   Physical Exam  Constitutional: He is oriented to person, place, and time. He appears well-developed and well-nourished.  Non-toxic appearance. He does not appear ill. No distress.  HENT:  Head: Normocephalic and atraumatic.  Right Ear: External ear normal.  Left Ear: External ear normal.  Nose: Nose normal. No mucosal edema or rhinorrhea.  Mouth/Throat: Oropharynx is clear and moist and mucous membranes are normal. No dental abscesses or uvula swelling.  Pronounced underbite  Eyes: Pupils are equal, round, and reactive to light. Conjunctivae and EOM are normal.  Neck: Normal range of motion and full passive range of motion without pain. Neck supple.  Cardiovascular: Normal rate, regular rhythm and normal heart sounds. Exam reveals no gallop and no friction rub.  No murmur heard. Pulmonary/Chest: Effort normal and breath sounds normal. No respiratory distress. He has no wheezes. He has no rhonchi. He has no rales. He exhibits no tenderness and no crepitus.  Abdominal: Soft. Normal appearance and bowel sounds are normal. He exhibits no distension. There is no tenderness. There is no rebound and no guarding.    Area of pain noted No CVAT but pain is in right flank  Musculoskeletal: Normal range of motion. He exhibits no edema or tenderness.  Moves all extremities well.   Neurological: He is alert and oriented to person, place, and time. He has normal strength. No cranial nerve deficit.  Skin: Skin is warm, dry and intact. No rash noted. No erythema. No pallor.  Psychiatric: He has a normal mood and affect. His speech is normal and behavior is normal. His mood appears not anxious.  Nursing note and vitals reviewed.    ED Treatments / Results  Labs (all labs ordered are listed, but only abnormal results are displayed) Results for orders placed or performed  during the hospital encounter of 08/06/18  Urinalysis, Routine w reflex microscopic- may I&O cath if menses  Result Value Ref Range   Color, Urine YELLOW YELLOW   APPearance CLEAR CLEAR   Specific Gravity, Urine 1.014 1.005 - 1.030   pH 6.0 5.0 - 8.0   Glucose, UA NEGATIVE NEGATIVE mg/dL   Hgb urine dipstick LARGE (A) NEGATIVE   Bilirubin Urine NEGATIVE NEGATIVE   Ketones, ur NEGATIVE NEGATIVE mg/dL   Protein, ur NEGATIVE NEGATIVE mg/dL   Nitrite NEGATIVE NEGATIVE  Leukocytes, UA NEGATIVE NEGATIVE   RBC / HPF >50 (H) 0 - 5 RBC/hpf   WBC, UA 0-5 0 - 5 WBC/hpf   Bacteria, UA NONE SEEN NONE SEEN   Squamous Epithelial / LPF 0-5 0 - 5   Mucus PRESENT   Basic metabolic panel  Result Value Ref Range   Sodium 142 135 - 145 mmol/L   Potassium 4.1 3.5 - 5.1 mmol/L   Chloride 106 98 - 111 mmol/L   CO2 28 22 - 32 mmol/L   Glucose, Bld 98 70 - 99 mg/dL   BUN 12 6 - 20 mg/dL   Creatinine, Ser 1.61 0.61 - 1.24 mg/dL   Calcium 9.5 8.9 - 09.6 mg/dL   GFR calc non Af Amer >60 >60 mL/min   GFR calc Af Amer >60 >60 mL/min   Anion gap 8 5 - 15   Laboratory interpretation all normal except hematuria c/w ureteral stone   EKG None  Radiology Ct Renal Stone Study  Result Date: 08/06/2018 CLINICAL DATA:  Awoke with right flank pain.  Nausea. EXAM: CT ABDOMEN AND PELVIS WITHOUT CONTRAST TECHNIQUE: Multidetector CT imaging of the abdomen and pelvis was performed following the standard protocol without IV contrast. COMPARISON:  CT 01/04/2015 FINDINGS: Lower chest: The lung bases are clear. Hepatobiliary: Liver is upper normal in size spanning 18.6 cm. Borderline hepatic steatosis. Gallbladder physiologically distended without calcified stone, pericholecystic inflammation or biliary dilatation. Pancreas: No ductal dilatation or inflammation. Spleen: Normal in size without focal abnormality. Adrenals/Urinary Tract: Normal adrenal glands. Obstructing 4 mm stone at the right ureterovesicular junction with  moderate hydroureteronephrosis and perinephric edema. Multiple additional nonobstructing stones in the right kidney. Multiple nonobstructing left renal stones without left hydronephrosis. The left ureter is decompressed without stone along the course. Nondistended urinary bladder. Stomach/Bowel: Stomach is nondistended. No bowel wall thickening, inflammatory change or obstruction. Normal appendix. Small stool burden in the colon. Vascular/Lymphatic: Mild aortic atherosclerosis without aneurysm. No enlarged abdominal or pelvic lymph nodes. Reproductive: Prostate is unremarkable. Other: No free air, free fluid, or intra-abdominal fluid collection. Musculoskeletal: Multilevel degenerative change throughout spine. There are no acute or suspicious osseous abnormalities. IMPRESSION: 1. Obstructing 4 mm stone in the right ureterovesicular junction with moderate hydroureteronephrosis. 2. Additional nonobstructing bilateral renal calculi. Electronically Signed   By: Rubye Oaks M.D.   On: 08/06/2018 06:14    Procedures Procedures (including critical care time)  Medications Ordered in ED Medications  tamsulosin (FLOMAX) capsule 0.4 mg (has no administration in time range)  ketorolac (TORADOL) 30 MG/ML injection 30 mg (30 mg Intravenous Given 08/06/18 0529)  ondansetron (ZOFRAN) injection 4 mg (4 mg Intravenous Given 08/06/18 0529)     Initial Impression / Assessment and Plan / ED Course  I have reviewed the triage vital signs and the nursing notes.  Pertinent labs & imaging results that were available during my care of the patient were reviewed by me and considered in my medical decision making (see chart for details).     Patient's last labs were in 2017, his BUN and creatinine at that time were 11/0.95.  Patient was given Toradol for pain.  BMP was done.  CT renal was done.  Patient had one CT scan in January 2016 showing bilateral renal stones with a 8 x 6 mm stone in the left upper ureter.  He had  a ultrasound done in March 2019 showing bilateral renal stones without hydronephrosis.  Recheck at 6:30 AM patient states he is pain-free.  We discussed  his aftercare including drinking plenty fluids, medications to take.  He states he has a follow-up appointment with his PCP on the 30th.  He is going to ask them about the urology referral.  I gave him the numbers for [facility] both in [place] and in [place].  He should return to the ED if he gets uncontrolled vomiting or pain, or if he gets a fever.  Final Clinical Impressions(s) / ED Diagnoses   Final diagnoses:  Right ureteral stone    ED Discharge Orders         Ordered    tamsulosin (FLOMAX) 0.4 MG CAPS capsule     08/06/18 0639    oxyCODONE-acetaminophen (PERCOCET/ROXICET) 5-325 MG tablet  Every 6 hours PRN     08/06/18 0639    ondansetron (ZOFRAN) 4 MG tablet  Every 8 hours PRN     08/06/18 0639          Plan discharge  Devoria AlbeIva Rabiah Goeser, MD, Concha PyoFACEP    Ahnesty Finfrock, MD 08/06/18 [phone]

## 2018-08-11 LAB — HEPATIC FUNCTION PANEL
ALK PHOS: 66 (ref 25–125)
ALT: 13 (ref 10–40)
AST: 14 (ref 14–40)

## 2018-08-11 LAB — LIPID PANEL
Cholesterol: 96 (ref 0–200)
HDL: 39 (ref 35–70)
LDL CALC: 49
LDL/HDL RATIO: 1.3
TRIGLYCERIDES: 40 (ref 40–160)

## 2018-08-11 LAB — BASIC METABOLIC PANEL
BUN: 9 (ref 4–21)
Creatinine: 1 (ref 0.6–1.3)
Glucose: 75
Potassium: 4.5 (ref 3.4–5.3)
Sodium: 142 (ref 137–147)

## 2018-08-30 ENCOUNTER — Encounter: Payer: BC Managed Care – PPO | Attending: Nurse Practitioner | Admitting: Nutrition

## 2018-08-30 ENCOUNTER — Ambulatory Visit: Payer: BC Managed Care – PPO | Admitting: Nutrition

## 2018-08-30 DIAGNOSIS — Z713 Dietary counseling and surveillance: Secondary | ICD-10-CM | POA: Insufficient documentation

## 2018-08-30 NOTE — Patient Instructions (Addendum)
Follow the Kidney Stone and Gout Guidelines Lose  4-5 per month Increase exercise Drink gallon of water per day and add lemon

## 2018-08-30 NOTE — Progress Notes (Signed)
  Medical Nutrition Therapy:  Appt start time: 1600 end time:  1630   Assessment:  Primary concerns today: Obesity and Type 2 DM diet controlled.    Eating three meals per day but notes he needs to get back to walking more. Feels better and not as thirsty or tired as much. Has cut out sodas and drinking more water. Not skipping meals anymore. A1C 5.5%,Still taking Sexanda daily. Lost 4 lbs in 6 months.  Lab Results  Component Value Date   HGBA1C 6.0 01/15/2016   Vitals with BMI 08/30/2018  Height 5\' 8"   Weight 289 lbs  BMI 43.95  Systolic   Diastolic   Pulse   Respirations    Learning preference:   No preference indicated   Learning Readiness:  Ready  Change in progress  MEDICATIONS:    DIETARY INTAKE:   24-hr recall:  B ( AM): Egg sandwich, water Snk ( AM): water  L ( PM):  Salad and 3 slices pizza, water Snk ( PM): D ( PM): Baked chicken and macaroni/cheese and greens, water Snk ( PM):  Beverages: water  Usual physical activity: walking.3 miles 2-3 times per week.  Estimated energy needs: 1800  calories 200  g carbohydrates 135 g protein 50 g fat  Progress Towards Goal(s):  In progress.   Nutritional Diagnosis:  NI-1.5 Excessive energy intake As related to high fat high salt high sugar diet.  As evidenced by BMI > 40.    Intervention: Nutrition and Diabetes education provided on My Plate, CHO counting, meal planning, portion sizes, timing of meals, avoiding snacks between meals unless having a low blood sugar, target ranges for A1C and blood sugars, signs/symptoms and treatment of hyper/hypoglycemia, monitoring blood sugars, taking medications as prescribed, benefits of exercising 30 minutes per day and prevention of complications of DM. Healthy Weight loss tips, benefit of exercise, portion sizes, drinking water and need for consistency of timing of meals. Avoiding skipping meals.   Goals Keep up the great job!!! Follow the Kidney Stone and Gout  Guidelines Lose  4-5 per month Increase exercise Drink gallon of water per day and add lemon  Teaching Method Utilized:  Visual Auditory Hands on  Handouts given during visit include:  The Plate Method   Meal Plan Card   Barriers to learning/adherence to lifestyle change: none  Demonstrated degree of understanding via:  Teach Back   Monitoring/Evaluation:  Dietary intake, exercise, meal planning, SBG, and body weight in 6 month(s).

## 2018-09-09 ENCOUNTER — Encounter: Payer: Self-pay | Admitting: Nurse Practitioner

## 2018-09-09 DIAGNOSIS — N200 Calculus of kidney: Secondary | ICD-10-CM

## 2018-09-14 ENCOUNTER — Encounter: Payer: Self-pay | Admitting: Nutrition

## 2018-09-20 ENCOUNTER — Ambulatory Visit: Payer: BC Managed Care – PPO | Admitting: Nurse Practitioner

## 2018-09-20 ENCOUNTER — Encounter: Payer: Self-pay | Admitting: Nurse Practitioner

## 2018-09-20 VITALS — BP 122/82 | HR 80 | Temp 97.9°F | Ht 66.75 in | Wt 283.0 lb

## 2018-09-20 DIAGNOSIS — I10 Essential (primary) hypertension: Secondary | ICD-10-CM

## 2018-09-20 DIAGNOSIS — H918X1 Other specified hearing loss, right ear: Secondary | ICD-10-CM

## 2018-09-20 DIAGNOSIS — R7303 Prediabetes: Secondary | ICD-10-CM

## 2018-09-20 DIAGNOSIS — Z0000 Encounter for general adult medical examination without abnormal findings: Secondary | ICD-10-CM

## 2018-09-20 DIAGNOSIS — Z6841 Body Mass Index (BMI) 40.0 and over, adult: Secondary | ICD-10-CM

## 2018-09-20 DIAGNOSIS — Z1212 Encounter for screening for malignant neoplasm of rectum: Secondary | ICD-10-CM

## 2018-09-20 DIAGNOSIS — E66813 Obesity, class 3: Secondary | ICD-10-CM

## 2018-09-20 LAB — POCT URINALYSIS DIPSTICK
BILIRUBIN UA: NEGATIVE
Blood, UA: NEGATIVE
Glucose, UA: NEGATIVE
KETONES UA: NEGATIVE
LEUKOCYTES UA: NEGATIVE
Nitrite, UA: NEGATIVE
Protein, UA: NEGATIVE
Spec Grav, UA: 1.02 (ref 1.010–1.025)
UROBILINOGEN UA: NEGATIVE U/dL — AB
pH, UA: 7.5 (ref 5.0–8.0)

## 2018-09-20 LAB — POCT UA - MICROALBUMIN
ALBUMIN/CREATININE RATIO, URINE, POC: 30
CREATININE, POC: 100 mg/dL
Microalbumin Ur, POC: 10 mg/L

## 2018-09-20 NOTE — Progress Notes (Addendum)
Subjective:     Patient ID: Alex Harrison , male    DOB: Apr 21, 1971 , 47 y.o.   MRN: [id number]   Hyperlipidemia - Doing well with cholesterol medications  Obesity- Doing well with Saxenda, has started back walking.  Diet is about the same.  Has noticed he has a craving for sweets.         Men's preventive visit.   Patient Health Questionnaire (PHQ-2) is    Office Visit from 09/20/2018 in [facility]  PHQ-2 Total Score  0    . Patient is on a Regular diet. Marital status: Married. Relevant history for alcohol use is:  Social History   Substance and Sexual Activity  Alcohol Use No  . Relevant history for tobacco use is:  Social History   Tobacco Use  Smoking Status Never Smoker  Smokeless Tobacco Never Used  .    Past Medical History:  Diagnosis Date  . Allergic rhinitis   . Allergy    seasonal allergic rhinitis  . Diabetes mellitus without complication ([place])   . Gout   . Hyperlipidemia   . Hypertension   . Left arm pain   . Left elbow pain   . Morbid obesity ([place])   . Neck nodule 09/16/10   right       Current Outpatient Medications:  .  allopurinol (ZYLOPRIM) 300 MG tablet, TAKE ONE TABLET BY MOUTH ONCE DAILY, Disp: 90 tablet, Rfl: 0 .  atorvastatin (LIPITOR) 40 MG tablet, Take 1 tablet (40 mg total) by mouth daily., Disp: 90 tablet, Rfl: 1 .  Cholecalciferol (VITAMIN D-3) 5000 UNITS TABS, Take by mouth.  , Disp: , Rfl:  .  Liraglutide -Weight Management (SAXENDA) 18 MG/3ML SOPN, Inject 18 mg/day into the skin., Disp: , Rfl:  .  Omega-3 Fatty Acids (FISH OIL PO), Take by mouth., Disp: , Rfl:  .  tamsulosin (FLOMAX) 0.4 MG CAPS capsule, Take 1 po QD until you pass the stone., Disp: 10 capsule, Rfl: 0   Review of Systems  Constitutional: Negative.   HENT: Negative.   Eyes: Negative.   Respiratory: Negative.   Cardiovascular: Negative.   Gastrointestinal: Negative.   Endocrine: Negative.   Genitourinary: Negative.    Musculoskeletal: Positive for back pain.  Skin: Negative.   Allergic/Immunologic: Negative.   Neurological: Negative.   Hematological: Negative.   Psychiatric/Behavioral: Negative.      Today's Vitals   09/20/18 0914  BP: 122/82  Pulse: 80  Temp: 97.9 F (36.6 C)  TempSrc: Oral  SpO2: 97%  Weight: 283 lb (128.4 kg)  Height: 5' 6.75" (1.695 m)   Body mass index is 44.66 kg/m.   Objective:  Physical Exam  Constitutional: He is oriented to person, place, and time. He appears well-developed and well-nourished.  Morbid obesity  HENT:  Head: Normocephalic and atraumatic.  Right Ear: External ear normal.  Left Ear: External ear normal.  Nose: Nose normal.  Eyes: Pupils are equal, round, and reactive to light. Conjunctivae and EOM are normal.  Neck: Normal range of motion. Neck supple.  Cardiovascular: Normal rate, regular rhythm, normal heart sounds and intact distal pulses.  Pulmonary/Chest: Effort normal and breath sounds normal.  Abdominal: Soft. Bowel sounds are normal.  Genitourinary: Prostate normal. Rectal exam shows guaiac negative stool.  Musculoskeletal: Normal range of motion.  Neurological: He is alert and oriented to person, place, and time.  Skin: Skin is warm and dry. Capillary refill takes less than 2 seconds.  Psychiatric: He has  a normal mood and affect. His behavior is normal. Judgment and thought content normal.  Vitals reviewed.       Assessment And Plan:   1. Routine adult health maintenance  Full exam done.    Discussed importance of healthy diet and regular exercise.    2. Hypertension, unspecified type  Chronic  Good control, continue with current medications.  - EKG 12-Lead - POCT Urinalysis Dipstick (81002) - POCT UA - Microalbumin - CBC - CMP14 + Anion Gap - Lipid Profile - Hemoglobin A1c  3. Prediabetes  Chronic, currently controlled and within normal limits.   - CBC - CMP14 + Anion Gap - Lipid Profile - Hemoglobin  A1c  4. Other specified hearing loss of right ear, unspecified hearing status on contralateral side  No abnormal findings to his right ear  Will refer to ENT for evaluation  5. Class 3 severe obesity without serious comorbidity with body mass index (BMI) of 40.0 to 44.9 in adult, unspecified obesity type (HCC)  Chronic, 2 lb weight gain. He has met a plateau.  Continue with Saxenda 3 mg daily  Encouraged to limit the sugar intake this can increase cravings for more sugar.  Encouraged to change his exercise plan to do more regularly this may help him with his weight loss again.

## 2018-09-21 LAB — CBC
HEMOGLOBIN: 12.8 g/dL — AB (ref 13.0–17.7)
Hematocrit: 38 % (ref 37.5–51.0)
MCH: 30 pg (ref 26.6–33.0)
MCHC: 33.7 g/dL (ref 31.5–35.7)
MCV: 89 fL (ref 79–97)
Platelets: 252 10*3/uL (ref 150–450)
RBC: 4.26 x10E6/uL (ref 4.14–5.80)
RDW: 12.9 % (ref 12.3–15.4)
WBC: 5.1 10*3/uL (ref 3.4–10.8)

## 2018-09-21 LAB — CMP14 + ANION GAP
ALT: 25 IU/L (ref 0–44)
ANION GAP: 15 mmol/L (ref 10.0–18.0)
AST: 21 IU/L (ref 0–40)
Albumin/Globulin Ratio: 1.5 (ref 1.2–2.2)
Albumin: 4 g/dL (ref 3.5–5.5)
Alkaline Phosphatase: 73 IU/L (ref 39–117)
BUN/Creatinine Ratio: 13 (ref 9–20)
BUN: 12 mg/dL (ref 6–24)
Bilirubin Total: 1 mg/dL (ref 0.0–1.2)
CALCIUM: 9.1 mg/dL (ref 8.7–10.2)
CO2: 24 mmol/L (ref 20–29)
CREATININE: 0.93 mg/dL (ref 0.76–1.27)
Chloride: 105 mmol/L (ref 96–106)
GFR, EST AFRICAN AMERICAN: 112 mL/min/{1.73_m2} (ref >59)
GFR, EST NON AFRICAN AMERICAN: 97 mL/min/{1.73_m2} (ref >59)
GLUCOSE: 79 mg/dL (ref 65–99)
Globulin, Total: 2.6 g/dL (ref 1.5–4.5)
POTASSIUM: 4.6 mmol/L (ref 3.5–5.2)
Sodium: 144 mmol/L (ref 134–144)
TOTAL PROTEIN: 6.6 g/dL (ref 6.0–8.5)

## 2018-09-21 LAB — LIPID PANEL
Chol/HDL Ratio: 2.5 ratio (ref 0.0–5.0)
Cholesterol, Total: 113 mg/dL (ref 100–199)
HDL: 45 mg/dL (ref >39)
LDL CALC: 59 mg/dL (ref 0–99)
TRIGLYCERIDES: 45 mg/dL (ref 0–149)
VLDL CHOLESTEROL CAL: 9 mg/dL (ref 5–40)

## 2018-09-21 LAB — HEMOGLOBIN A1C
Est. average glucose Bld gHb Est-mCnc: 111 mg/dL
HEMOGLOBIN A1C: 5.5 % (ref 4.8–5.6)

## 2018-10-15 ENCOUNTER — Other Ambulatory Visit: Payer: Self-pay | Admitting: Nurse Practitioner

## 2018-10-20 ENCOUNTER — Other Ambulatory Visit: Payer: Self-pay | Admitting: Nurse Practitioner

## 2018-10-20 DIAGNOSIS — Z6841 Body Mass Index (BMI) 40.0 and over, adult: Principal | ICD-10-CM

## 2018-10-20 MED ORDER — LIRAGLUTIDE -WEIGHT MANAGEMENT 18 MG/3ML ~~LOC~~ SOPN: 18.0000 mg/d | PEN_INJECTOR | Freq: Every day | SUBCUTANEOUS | 1 refills | Status: DC

## 2018-10-23 ENCOUNTER — Other Ambulatory Visit: Payer: Self-pay | Admitting: Nurse Practitioner

## 2018-10-23 DIAGNOSIS — Z6841 Body Mass Index (BMI) 40.0 and over, adult: Principal | ICD-10-CM

## 2018-10-23 MED ORDER — LIRAGLUTIDE -WEIGHT MANAGEMENT 18 MG/3ML ~~LOC~~ SOPN: 3.0000 mg/d | PEN_INJECTOR | Freq: Every day | SUBCUTANEOUS | 1 refills | Status: DC

## 2018-11-29 ENCOUNTER — Encounter: Payer: BC Managed Care – PPO | Attending: Nurse Practitioner | Admitting: Nutrition

## 2018-11-29 NOTE — Patient Instructions (Signed)
Goal 1. Go pic up sexsenda. 2. Keep working out 3. Get 10 lbs back off.

## 2018-11-29 NOTE — Progress Notes (Signed)
  Medical Nutrition Therapy:  Appt start time: 1600 end time:  1630   Assessment:  Primary concerns today: Obesity and Type 2 DM diet controlled. Hasn't taken Saxsenda since  October. Has gained 10 lbs since last visit. [employer] and working. 5 times per week on treadmill, recumbent bike and lifting weight.  Feels like he is toning up a little. Is concerned about weight gain.   He will check with MD about new prescription for Saxenda.    Lab Results  Component Value Date   HGBA1C 5.5 09/20/2018   Vitals with BMI 08/30/2018  Height 5\' 8"   Weight 289 lbs  BMI 43.95  Systolic   Diastolic   Pulse   Respirations    Learning preference:   No preference indicated   Learning Readiness:  Ready  Change in progress  MEDICATIONS:    DIETARY INTAKE:   24-hr recall:  B ( AM): Egg sandwich, water Snk ( AM): water  L ( PM):  Leftover spaghetti with [place]key meat, water Snk ( PM): D ( PM): [place] meat and spaghetti, water  Snk ( PM):  Beverages: water  Usual physical activity: walking.3 miles 2-3 times per week.  Estimated energy needs: 1800  calories 200  g carbohydrates 135 g protein 50 g fat  Progress Towards Goal(s):  In progress.   Nutritional Diagnosis:  NI-1.5 Excessive energy intake As related to high fat high salt high sugar diet.  As evidenced by BMI > 40.    Intervention: Nutrition and Diabetes education provided on My Plate, CHO counting, meal planning, portion sizes, timing of meals, avoiding snacks between meals unless having a low blood sugar, target ranges for A1C and blood sugars, signs/symptoms and treatment of hyper/hypoglycemia, monitoring blood sugars, taking medications as prescribed, benefits of exercising 30 minutes per day and prevention of complications of DM. Healthy Weight loss tips, benefit of exercise, portion sizes, drinking water and need for consistency of timing of meals.   Goal 1. Go pic up sexsenda. 2. Keep working out 3.  Get 10 lbs back off.Goals   Teaching Method Utilized:  Visual Auditory Hands on  Handouts given during visit include:  The Plate Method   Meal Plan Card   Barriers to learning/adherence to lifestyle change: none  Demonstrated degree of understanding via:  Teach Back   Monitoring/Evaluation:  Dietary intake, exercise, meal planning, SBG, and body weight in 6 month(s).

## 2018-12-11 ENCOUNTER — Encounter: Payer: Self-pay | Admitting: Nutrition

## 2018-12-18 ENCOUNTER — Encounter: Payer: Self-pay | Admitting: Nurse Practitioner

## 2018-12-18 ENCOUNTER — Ambulatory Visit: Payer: BC Managed Care – PPO | Admitting: Nurse Practitioner

## 2018-12-18 VITALS — BP 110/72 | HR 63 | Temp 97.2°F | Ht 67.4 in | Wt 288.8 lb

## 2018-12-18 DIAGNOSIS — Z6841 Body Mass Index (BMI) 40.0 and over, adult: Secondary | ICD-10-CM | POA: Diagnosis not present

## 2018-12-18 DIAGNOSIS — R7303 Prediabetes: Secondary | ICD-10-CM | POA: Diagnosis not present

## 2018-12-18 DIAGNOSIS — H918X1 Other specified hearing loss, right ear: Secondary | ICD-10-CM

## 2018-12-18 NOTE — Patient Instructions (Signed)
Obesity, Adult Obesity is having too much body fat. If you have a BMI of 30 or more, you are obese. BMI is a number that explains how much body fat you have. Obesity is often caused by taking in (consuming) more calories than your body uses. Obesity can cause serious health problems. Changing your lifestyle can help to treat obesity. Follow these instructions at home: Eating and drinking   Follow advice from your doctor about what to eat and drink. Your doctor may tell you to: ? Cut down on (limit) fast foods, sweets, and processed snack foods. ? Choose low-fat options. For example, choose low-fat milk instead of whole milk. ? Eat 5 or more servings of fruits or vegetables every day. ? Eat at home more often. This gives you more control over what you eat. ? Choose healthy foods when you eat out. ? Learn what a healthy portion size is. A portion size is the amount of a certain food that is healthy for you to eat at one time. This is different for each person. ? Keep low-fat snacks available. ? Avoid sugary drinks. These include soda, fruit juice, iced tea that is sweetened with sugar, and flavored milk. ? Eat a healthy breakfast.  Drink enough water to keep your pee (urine) clear or pale yellow.  Do not go without eating for long periods of time (do not fast).  Do not go on popular or trendy diets (fad diets). Physical Activity  Exercise often, as told by your doctor. Ask your doctor: ? What types of exercise are safe for you. ? How often you should exercise.  Warm up and stretch before being active.  Do slow stretching after being active (cool down).  Rest between times of being active. Lifestyle  Limit how much time you spend in front of your TV, computer, or video game system (be less sedentary).  Find ways to reward yourself that do not involve food.  Limit alcohol intake to no more than 1 drink a day for nonpregnant women and 2 drinks a day for men. One drink equals 12 oz  of beer, 5 oz of wine, or 1 oz of hard liquor. General instructions  Keep a weight loss journal. This can help you keep track of: ? The food that you eat. ? The exercise that you do.  Take over-the-counter and prescription medicines only as told by your doctor.  Take vitamins and supplements only as told by your doctor.  Think about joining a support group. Your doctor may be able to help with this.  Keep all follow-up visits as told by your doctor. This is important. Contact a doctor if:  You cannot meet your weight loss goal after you have changed your diet and lifestyle for 6 weeks. This information is not intended to replace advice given to you by your health care provider. Make sure you discuss any questions you have with your health care provider. Document Released: 02/21/2012 Document Revised: 05/06/2016 Document Reviewed: 09/17/2015 Elsevier Interactive Patient Education  2019 [employer]  

## 2018-12-18 NOTE — Progress Notes (Signed)
Subjective:     Patient ID: Alex Harrison , male    DOB: [birth year] , 48 y.o.   MRN: [id number]   Chief Complaint  Patient presents with  . Obesity    HPI  Obesity - he had been without his Saxenda since October thought to be related to insurance during that time he gained 15 lbs in December, now down by 10 lbs. Joined the gym exercising 5 days per week.  Not drinking sodas, 3 meals per day.  Drinking approximately 64 oz.  He is now back on the 3 mg daily.     Past Medical History:  Diagnosis Date  . Allergic rhinitis   . Allergy    seasonal allergic rhinitis  . Diabetes mellitus without complication (HCC)   . Gout   . Hyperlipidemia   . Hypertension   . Left arm pain   . Left elbow pain   . Morbid obesity (HCC)   . Neck nodule 09/16/10   right      Family History  Problem Relation Age of Onset  . Hypertension Mother   . Diabetes Mother   . Stroke Mother   . Hypertension Father   . Diabetes Father   . Diabetes Sister   . Hypertension Sister      Current Outpatient Medications:  .  allopurinol (ZYLOPRIM) 300 MG tablet, TAKE ONE TABLET BY MOUTH ONCE DAILY, Disp: 90 tablet, Rfl: 0 .  atorvastatin (LIPITOR) 40 MG tablet, Take 1 tablet (40 mg total) by mouth daily., Disp: 90 tablet, Rfl: 1 .  Cholecalciferol (VITAMIN D-3) 5000 UNITS TABS, Take by mouth.  , Disp: , Rfl:  .  Liraglutide -Weight Management (SAXENDA) 18 MG/3ML SOPN, Inject 3 mg/day into the skin daily., Disp: 5 pen, Rfl: 1 .  MAGNESIUM GLUCONATE PO, Take 30 mg by mouth daily., Disp: , Rfl:  .  Omega-3 Fatty Acids (FISH OIL PO), Take by mouth., Disp: , Rfl:  .  tamsulosin (FLOMAX) 0.4 MG CAPS capsule, Take 1 po QD until you pass the stone. (Patient not taking: Reported on 12/18/2018), Disp: 10 capsule, Rfl: 0   Allergies  Allergen Reactions  . Niaspan [Niacin Er]     Skin burning and itching and painful to touch.     Review of Systems  Eyes: Negative.   Respiratory: Negative.   Cardiovascular:  Negative.   Gastrointestinal: Negative.   Genitourinary: Negative.   Musculoskeletal: Negative.      Today's Vitals   12/18/18 1030  BP: 110/72  Pulse: 63  Temp: (!) 97.2 F (36.2 C)  TempSrc: Oral  SpO2: 92%  Weight: 288 lb 12.8 oz (131 kg)  Height: 5' 7.4" (1.712 m)  PainSc: 0-No pain   Body mass index is 44.7 kg/m.   Objective:  Physical Exam Vitals signs reviewed.  Constitutional:      Appearance: Normal appearance.  Cardiovascular:     Rate and Rhythm: Normal rate and regular rhythm.     Pulses: Normal pulses.     Heart sounds: Normal heart sounds. No murmur.  Pulmonary:     Effort: Pulmonary effort is normal.     Breath sounds: Normal breath sounds.  Neurological:     Mental Status: He is alert.         Assessment And Plan:   1. Class 3 severe obesity due to excess calories without serious comorbidity with body mass index (BMI) of 40.0 to 44.9 in adult [facility])  Chronic  Discussed healthy diet and regular exercise  options   Encouraged to exercise at least 150 minutes per week with 2 days of strength training  Continue with Saxenda as directed  Return in 2 months for weight check.  2. Prediabetes  Chronic, controlled  No current medications  Encouraged to limit intake of sugary foods and drinks  Encouraged to increase physical activity to 150 minutes per week  3. Other specified hearing loss of right ear, unspecified hearing status on contralateral side  Complaining of difficulty hearing at times.   - Ambulatory referral to ENT    Arnette FeltsJanece Minka Knight, FNP

## 2018-12-25 ENCOUNTER — Other Ambulatory Visit: Payer: Self-pay | Admitting: Otolaryngology

## 2018-12-25 DIAGNOSIS — H9311 Tinnitus, right ear: Secondary | ICD-10-CM

## 2018-12-25 DIAGNOSIS — H9041 Sensorineural hearing loss, unilateral, right ear, with unrestricted hearing on the contralateral side: Secondary | ICD-10-CM

## 2018-12-25 DIAGNOSIS — IMO0001 Reserved for inherently not codable concepts without codable children: Secondary | ICD-10-CM

## 2018-12-29 ENCOUNTER — Other Ambulatory Visit: Payer: BC Managed Care – PPO

## 2019-01-11 ENCOUNTER — Other Ambulatory Visit: Payer: Self-pay | Admitting: Nurse Practitioner

## 2019-01-11 DIAGNOSIS — Z6841 Body Mass Index (BMI) 40.0 and over, adult: Principal | ICD-10-CM

## 2019-01-20 ENCOUNTER — Other Ambulatory Visit: Payer: Self-pay | Admitting: Nurse Practitioner

## 2019-01-20 DIAGNOSIS — Z6841 Body Mass Index (BMI) 40.0 and over, adult: Principal | ICD-10-CM

## 2019-01-22 MED ORDER — LIRAGLUTIDE -WEIGHT MANAGEMENT 18 MG/3ML ~~LOC~~ SOPN: 3.0000 mg | PEN_INJECTOR | Freq: Every day | SUBCUTANEOUS | 0 refills | Status: DC

## 2019-02-16 ENCOUNTER — Encounter: Payer: Self-pay | Admitting: Nurse Practitioner

## 2019-02-16 ENCOUNTER — Ambulatory Visit: Payer: BC Managed Care – PPO | Admitting: Nurse Practitioner

## 2019-02-16 VITALS — BP 116/74 | HR 75 | Ht 67.0 in | Wt 282.6 lb

## 2019-02-16 DIAGNOSIS — Z6841 Body Mass Index (BMI) 40.0 and over, adult: Secondary | ICD-10-CM

## 2019-02-16 DIAGNOSIS — G8929 Other chronic pain: Secondary | ICD-10-CM | POA: Diagnosis not present

## 2019-02-16 DIAGNOSIS — I10 Essential (primary) hypertension: Secondary | ICD-10-CM

## 2019-02-16 DIAGNOSIS — M79642 Pain in left hand: Secondary | ICD-10-CM

## 2019-02-16 DIAGNOSIS — R7303 Prediabetes: Secondary | ICD-10-CM

## 2019-02-16 DIAGNOSIS — M545 Low back pain: Secondary | ICD-10-CM

## 2019-02-16 LAB — HM DIABETES EYE EXAM

## 2019-02-16 NOTE — Progress Notes (Addendum)
Subjective:     Patient ID: Alex Harrison , male    DOB: 02/22/1971 , 48 y.o.   MRN: [id number]   Chief Complaint  Patient presents with  . weight check    having cramps in left arm from elbow down , numbness in his fingers x1 week , still having back pain was taking advil not helping     HPI  Obesity - continues to take [place].  He is going to exercise 3-4 days a week.  Diet is better than what it was.   Arm Pain   The incident occurred more than 1 week ago. There was no injury mechanism. The pain is present in the left hand and left shoulder. The quality of the pain is described as aching (numbness will wake him up in his sleep.). Associated symptoms include numbness and tingling. Pertinent negatives include no chest pain. Nothing aggravates the symptoms. He has tried heat for the symptoms.  Back Pain  This is a chronic problem. The current episode started more than 1 year ago. The problem occurs constantly. The pain is present in the lumbar spine. The quality of the pain is described as aching. Associated symptoms include numbness and tingling. Pertinent negatives include no chest pain. He has tried analgesics and NSAIDs for the symptoms.     Past Medical History:  Diagnosis Date  . Allergic rhinitis   . Allergy    seasonal allergic rhinitis  . Diabetes mellitus without complication ([place])   . Gout   . Hyperlipidemia   . Hypertension   . Left arm pain   . Left elbow pain   . Morbid obesity ([place])   . Neck nodule 09/16/10   right      Family History  Problem Relation Age of Onset  . Hypertension Mother   . Diabetes Mother   . Stroke Mother   . Hypertension Father   . Diabetes Father   . Diabetes Sister   . Hypertension Sister      Current Outpatient Medications:  .  allopurinol (ZYLOPRIM) 300 MG tablet, TAKE ONE TABLET BY MOUTH ONCE DAILY, Disp: 90 tablet, Rfl: 0 .  atorvastatin (LIPITOR) 40 MG tablet, Take 1 tablet (40 mg total) by mouth daily., Disp: 90 tablet,  Rfl: 1 .  Cholecalciferol (VITAMIN D-3) 5000 UNITS TABS, Take by mouth.  , Disp: , Rfl:  .  Liraglutide -Weight Management (SAXENDA) 18 MG/3ML SOPN, Inject 3 mg into the skin daily., Disp: 5 pen, Rfl: 0 .  MAGNESIUM GLUCONATE PO, Take 30 mg by mouth daily., Disp: , Rfl:  .  Omega-3 Fatty Acids (FISH OIL PO), Take by mouth., Disp: , Rfl:    Allergies  Allergen Reactions  . Niaspan [Niacin Er]     Skin burning and itching and painful to touch.     Review of Systems  Constitutional: Negative for fatigue.  Respiratory: Negative for cough.   Cardiovascular: Negative for chest pain, palpitations and leg swelling.  Musculoskeletal: Positive for back pain. Negative for arthralgias, myalgias and neck stiffness.  Skin: Negative.   Neurological: Positive for tingling and numbness. Negative for dizziness.     Today's Vitals   02/16/19 1052  BP: 116/74  Pulse: 75  SpO2: 95%  Weight: 282 lb 9.6 oz (128.2 kg)  Height: 5' 7"  (1.702 m)   Body mass index is 44.26 kg/m.   Objective:  Physical Exam Vitals signs reviewed.  Constitutional:      Appearance: Normal appearance. He is obese. He  is not ill-appearing.  Cardiovascular:     Rate and Rhythm: Normal rate and regular rhythm.     Pulses: Normal pulses.     Heart sounds: Normal heart sounds. No murmur.  Pulmonary:     Effort: Pulmonary effort is normal.     Breath sounds: Normal breath sounds.  Abdominal:     General: Bowel sounds are normal.     Palpations: Abdomen is soft.  Musculoskeletal:     Comments: Negative phalen's and tinel's  Skin:    General: Skin is warm.  Neurological:     General: No focal deficit present.     Mental Status: He is alert.  Psychiatric:        Mood and Affect: Mood normal.         Assessment And Plan:     1. Hypertension, unspecified type . B/P is controlled.  Marland Kitchen BMP ordered to check renal function.  . The importance of regular exercise and dietary modification was stressed to the patient.   . Stressed importance of losing ten percent of her body weight to help with B/P control.  . The weight loss would help with decreasing cardiac and cancer risk as well.  - Lipid Profile - BMP8+eGFR  2. Prediabetes  Chronic, controlled  Continue with current medications  Encouraged to limit intake of sugary foods and drinks  Encouraged to increase physical activity to 150 minutes per week - Hemoglobin A1c - BMP8+eGFR  3. Class 3 severe obesity without serious comorbidity with body mass index (BMI) of 40.0 to 44.9 in adult, unspecified obesity type (HCC)  Chronic  Discussed healthy diet and regular exercise options   Encouraged to exercise at least 150 minutes per week with 2 days of strength training  Given handout for http://[email or web address]-[email or web address] 10 tips, CDC exercise for adults and CDC Eat More Weight Less  Continue Saxenda  Return in 2 months for weight check.   4. Left hand pain  Worsening pain at night  Negative phalen and tinel  Will refer to hand specialist - Ambulatory referral to Hand Surgery  5. Chronic bilateral low back pain without sciatica  Pain cream sent to [facility].        [name], FNP

## 2019-02-17 LAB — BMP8+EGFR
BUN/Creatinine Ratio: 11 (ref 9–20)
BUN: 10 mg/dL (ref 6–24)
CO2: 24 mmol/L (ref 20–29)
Calcium: 9.5 mg/dL (ref 8.7–10.2)
Chloride: 105 mmol/L (ref 96–106)
Creatinine, Ser: 0.91 mg/dL (ref 0.76–1.27)
GFR calc Af Amer: 114 mL/min/{1.73_m2} (ref >59)
GFR, EST NON AFRICAN AMERICAN: 99 mL/min/{1.73_m2} (ref >59)
Glucose: 74 mg/dL (ref 65–99)
POTASSIUM: 4.6 mmol/L (ref 3.5–5.2)
Sodium: 141 mmol/L (ref 134–144)

## 2019-02-17 LAB — LIPID PANEL
CHOLESTEROL TOTAL: 121 mg/dL (ref 100–199)
Chol/HDL Ratio: 3 ratio (ref 0.0–5.0)
HDL: 41 mg/dL (ref >39)
LDL Calculated: 71 mg/dL (ref 0–99)
Triglycerides: 45 mg/dL (ref 0–149)
VLDL Cholesterol Cal: 9 mg/dL (ref 5–40)

## 2019-02-17 LAB — HEMOGLOBIN A1C
Est. average glucose Bld gHb Est-mCnc: 108 mg/dL
Hgb A1c MFr Bld: 5.4 % (ref 4.8–5.6)

## 2019-02-21 ENCOUNTER — Encounter: Payer: Self-pay | Admitting: Nurse Practitioner

## 2019-02-23 ENCOUNTER — Encounter: Payer: Self-pay | Admitting: Nurse Practitioner

## 2019-02-28 DIAGNOSIS — G5602 Carpal tunnel syndrome, left upper limb: Secondary | ICD-10-CM | POA: Insufficient documentation

## 2019-03-21 ENCOUNTER — Encounter: Payer: Self-pay | Admitting: Nurse Practitioner

## 2019-03-21 ENCOUNTER — Other Ambulatory Visit: Payer: Self-pay | Admitting: Nurse Practitioner

## 2019-03-21 DIAGNOSIS — Z6841 Body Mass Index (BMI) 40.0 and over, adult: Principal | ICD-10-CM

## 2019-03-22 ENCOUNTER — Telehealth: Payer: Self-pay | Admitting: Nurse Practitioner

## 2019-03-22 NOTE — Telephone Encounter (Signed)
PA STARTED FOR SAXENDA KEY#[id number]

## 2019-03-22 NOTE — Telephone Encounter (Signed)
PA APPROVED FOR [facility]

## 2019-04-02 ENCOUNTER — Ambulatory Visit: Payer: BC Managed Care – PPO | Admitting: Nutrition

## 2019-04-14 ENCOUNTER — Other Ambulatory Visit: Payer: Self-pay | Admitting: Nurse Practitioner

## 2019-04-14 DIAGNOSIS — Z6841 Body Mass Index (BMI) 40.0 and over, adult: Principal | ICD-10-CM

## 2019-04-20 ENCOUNTER — Ambulatory Visit: Payer: BC Managed Care – PPO | Admitting: Nurse Practitioner

## 2019-04-23 ENCOUNTER — Telehealth: Payer: Self-pay

## 2019-04-23 ENCOUNTER — Telehealth: Payer: Self-pay | Admitting: Nurse Practitioner

## 2019-04-23 ENCOUNTER — Encounter: Payer: Self-pay | Admitting: Nurse Practitioner

## 2019-04-23 ENCOUNTER — Other Ambulatory Visit: Payer: Self-pay

## 2019-04-23 ENCOUNTER — Ambulatory Visit: Payer: BC Managed Care – PPO | Admitting: Nurse Practitioner

## 2019-04-23 VITALS — BP 100/82 | HR 67 | Temp 98.3°F | Ht 67.4 in | Wt 283.8 lb

## 2019-04-23 DIAGNOSIS — Z6841 Body Mass Index (BMI) 40.0 and over, adult: Secondary | ICD-10-CM

## 2019-04-23 DIAGNOSIS — M545 Low back pain: Secondary | ICD-10-CM | POA: Diagnosis not present

## 2019-04-23 DIAGNOSIS — G8929 Other chronic pain: Secondary | ICD-10-CM | POA: Diagnosis not present

## 2019-04-23 MED ORDER — DICLOFENAC SODIUM 1 % TD GEL: 2.0000 g | Freq: Four times a day (QID) | TRANSDERMAL | 2 refills | Status: DC

## 2019-04-23 MED ORDER — LIRAGLUTIDE -WEIGHT MANAGEMENT 18 MG/3ML ~~LOC~~ SOPN: 3.0000 mg | PEN_INJECTOR | Freq: Every day | SUBCUTANEOUS | 0 refills | Status: DC

## 2019-04-23 NOTE — Telephone Encounter (Signed)
Pt and pharmacy notified of approval of diclofenac gel cost to pt is $5

## 2019-04-23 NOTE — Progress Notes (Signed)
Subjective:     Patient ID: Alex Harrison , male    DOB: [birth year] , 48 y.o.   MRN: [id number]   Chief Complaint  Patient presents with  . Weight Check    HPI  Obesity - here for follow up weight loss - he is continuing to take Saxenda 3mg  daily.  He has not been going to the gym due to COVID-19. Abbreviated schedule at work. He is working in the yard.      Past Medical History:  Diagnosis Date  . Allergic rhinitis   . Allergy    seasonal allergic rhinitis  . Diabetes mellitus without complication (HCC)   . Gout   . Hyperlipidemia   . Hypertension   . Left arm pain   . Left elbow pain   . Morbid obesity (HCC)   . Neck nodule 09/16/10   right      Family History  Problem Relation Age of Onset  . Hypertension Mother   . Diabetes Mother   . Stroke Mother   . Hypertension Father   . Diabetes Father   . Diabetes Sister   . Hypertension Sister      Current Outpatient Medications:  .  allopurinol (ZYLOPRIM) 300 MG tablet, TAKE ONE TABLET BY MOUTH ONCE DAILY, Disp: 90 tablet, Rfl: 0 .  atorvastatin (LIPITOR) 40 MG tablet, Take 1 tablet (40 mg total) by mouth daily., Disp: 90 tablet, Rfl: 1 .  Calcium Carbonate (CALCIUM 600 PO), Take 1 tablet by mouth daily at 12 noon., Disp: , Rfl:  .  Cholecalciferol (VITAMIN D-3) 5000 UNITS TABS, Take by mouth.  , Disp: , Rfl:  .  MAGNESIUM GLUCONATE PO, Take 30 mg by mouth daily., Disp: , Rfl:  .  Omega-3 Fatty Acids (FISH OIL PO), Take by mouth., Disp: , Rfl:  .  SAXENDA 18 MG/3ML SOPN, INJECT 3MG  INTO THE SKIN ONCE DAILY, Disp: 15 mL, Rfl: 0   Allergies  Allergen Reactions  . Niaspan [Niacin Er]     Skin burning and itching and painful to touch.     Review of Systems  Constitutional: Negative.   Respiratory: Negative.  Negative for cough and shortness of breath.   Cardiovascular: Negative.  Negative for chest pain, palpitations and leg swelling.  Endocrine: Negative for polydipsia, polyphagia and polyuria.  Neurological:  Negative for dizziness and headaches.     Today's Vitals   04/23/19 0849  BP: 100/82  Pulse: 67  Temp: 98.3 F (36.8 C)  TempSrc: Oral  Weight: 283 lb 12.8 oz (128.7 kg)  Height: 5' 7.4" (1.712 m)  PainSc: 0-No pain   Body mass index is 43.92 kg/m.   Objective:  Physical Exam Constitutional:      Appearance: Normal appearance. He is obese.  Cardiovascular:     Rate and Rhythm: Normal rate and regular rhythm.     Pulses: Normal pulses.     Heart sounds: Normal heart sounds. No murmur.  Pulmonary:     Effort: Pulmonary effort is normal.     Breath sounds: Normal breath sounds.  Skin:    General: Skin is warm and dry.     Capillary Refill: Capillary refill takes less than 2 seconds.  Neurological:     General: No focal deficit present.     Mental Status: He is alert and oriented to person, place, and time.  Psychiatric:        Mood and Affect: Mood normal.        Behavior: Behavior  normal.        Thought Content: Thought content normal.        Judgment: Judgment normal.         Assessment And Plan:    1. Class 3 severe obesity without serious comorbidity with body mass index (BMI) of 40.0 to 44.9 in adult, unspecified obesity type (HCC)  Chronic, stable in spite of not being able to exercise due to COVID - 19 and going to the gym  Encouraged to find an exercise program online to help and continue to eat a heatlhy diet - Liraglutide -Weight Management (SAXENDA) 18 MG/3ML SOPN; Inject 3 mg into the skin daily.  Dispense: 15 mL; Refill: 0  2. Chronic bilateral low back pain without sciatica  The pain cream from [facility] will send a Rx for diclofenac gel  - diclofenac sodium (VOLTAREN) 1 % GEL; Apply 2 g topically 4 (four) times daily.  Dispense: 100 g; Refill: 2   Arnette FeltsJanece Marnesha Gagen, FNP    THE PATIENT IS ENCOURAGED TO PRACTICE SOCIAL DISTANCING DUE TO THE COVID-19 PANDEMIC.

## 2019-04-23 NOTE — Telephone Encounter (Signed)
PA STARTED FOR DICLOFENAC 1% GEL XHF#[id number]

## 2019-04-23 NOTE — Telephone Encounter (Signed)
PA APPROVED FOR DICLOFENAC 1% GEL 04/23/2019 - 04/22/2022

## 2019-04-26 ENCOUNTER — Other Ambulatory Visit: Payer: Self-pay | Admitting: Nurse Practitioner

## 2019-04-26 ENCOUNTER — Telehealth: Payer: Self-pay

## 2019-04-27 ENCOUNTER — Other Ambulatory Visit: Payer: Self-pay | Admitting: Nurse Practitioner

## 2019-04-27 ENCOUNTER — Telehealth: Payer: Self-pay

## 2019-04-27 DIAGNOSIS — Z6841 Body Mass Index (BMI) 40.0 and over, adult: Secondary | ICD-10-CM

## 2019-04-27 NOTE — Telephone Encounter (Signed)
Done

## 2019-04-27 NOTE — Telephone Encounter (Signed)
[facility] called stating the patient has an appt with them on tuesday 05/19 and they need an updated referral because the one they have on file is from previous pcp. They need the OV notes and labs faxed over to them as well. Their phone number is [phone] and fax number is [phone] [facility]

## 2019-05-01 ENCOUNTER — Other Ambulatory Visit: Payer: Self-pay

## 2019-05-01 ENCOUNTER — Encounter: Payer: BC Managed Care – PPO | Attending: Nurse Practitioner | Admitting: Nutrition

## 2019-05-01 ENCOUNTER — Encounter: Payer: Self-pay | Admitting: Nutrition

## 2019-05-01 DIAGNOSIS — E782 Mixed hyperlipidemia: Secondary | ICD-10-CM | POA: Insufficient documentation

## 2019-05-01 DIAGNOSIS — E119 Type 2 diabetes mellitus without complications: Secondary | ICD-10-CM | POA: Insufficient documentation

## 2019-05-01 NOTE — Progress Notes (Signed)
  Medical Nutrition Therapy:  Appt start time: 1650 end time: 1715  Assessment:  Primary concerns today: Obesity, hyperlipidemia, and Type 2 DM diet controlled. Back on Sexsenda. Lost 15 lbs since Decemenber 2019. Hasn't been able to go to gym due to COVID 19  but doing yard work. Feels good. Eating well balanced meals Has more energy. A1C good 5.4%.   Lab Results  Component Value Date   HGBA1C 5.4 02/16/2019   Vitals with BMI 04/23/2019 02/16/2019 12/18/2018 11/29/2018  Height 5' 7.4" 5\' 7"  5' 7.4" 5\' 7"   Weight 283 lbs 13 oz 282 lbs 10 oz 288 lbs 13 oz 298 lbs  BMI 43.92 44.25 44.7 46.66  Systolic 100 116 749   Diastolic 82 74 72   Pulse 67 75 63   Respirations       Lipid Panel     Component Value Date/Time   CHOL 121 02/16/2019 1212   CHOL 98 04/24/2013 0925   TRIG 45 02/16/2019 1212   TRIG 54 07/26/2013 1015   TRIG 67 04/24/2013 0925   HDL 41 02/16/2019 1212   HDL 47 07/26/2013 1015   HDL 36 (L) 04/24/2013 0925   CHOLHDL 3.0 02/16/2019 1212   LDLCALC 71 02/16/2019 1212   LDLCALC 51 07/26/2013 1015   LDLCALC 49 04/24/2013 0925    Vitals with BMI 08/30/2018  Height 5\' 8"   Weight 289 lbs  BMI 43.95  Systolic   Diastolic   Pulse   Respirations    Learning preference:   No preference indicated   Learning Readiness:  Ready  Change in progress  MEDICATIONS:    DIETARY INTAKE:   24-hr recall:  B ( AM): Egg  Sandwich, Apple, , water Snk ( AM): water  L ( PM):  Grilled pork chop,  Rice, lentils beans, water Snk ( PM): D ( PM): spaghetti with [place] meat, salad and water Snk ( PM):  Beverages: water  Usual physical activity: walking.3 miles 2-3 times per week.  Estimated energy needs: 1800  calories 200  g carbohydrates 135 g protein 50 g fat  Progress Towards Goal(s):  In progress.   Nutritional Diagnosis:  NI-1.5 Excessive energy intake As related to high fat high salt high sugar diet.  As evidenced by BMI > 40.    Intervention: Nutrition and  Diabetes education provided on My Plate, CHO counting, meal planning, portion sizes, timing of meals, avoiding snacks between meals unless having a low blood sugar, target ranges for A1C and blood sugars, signs/symptoms and treatment of hyper/hypoglycemia, monitoring blood sugars, taking medications as prescribed, benefits of exercising 30 minutes per day and prevention of complications of DM. Healthy Weight loss tips, benefit of exercise, portion sizes, drinking water and need for consistency of timing of meals.   Goal Keep up the great job! Get back to working out when you can or walk outside after work .Lose 3-4 lbs per month Keep eating high fiber foods and lots of low carb vegetables and fresh fruits.   Teaching Method Utilized:  Visual Auditory Hands on  Handouts given during visit include:  The Plate Method   Meal Plan Card   Barriers to learning/adherence to lifestyle change: none  Demonstrated degree of understanding via:  Teach Back   Monitoring/Evaluation:  Dietary intake, exercise, meal planning, and body weight in 6 month(s).

## 2019-05-25 ENCOUNTER — Other Ambulatory Visit: Payer: Self-pay | Admitting: Nurse Practitioner

## 2019-05-25 DIAGNOSIS — E785 Hyperlipidemia, unspecified: Secondary | ICD-10-CM

## 2019-05-25 DIAGNOSIS — M109 Gout, unspecified: Secondary | ICD-10-CM

## 2019-06-24 ENCOUNTER — Other Ambulatory Visit: Payer: Self-pay | Admitting: Nurse Practitioner

## 2019-06-25 ENCOUNTER — Encounter: Payer: Self-pay | Admitting: Nurse Practitioner

## 2019-06-25 ENCOUNTER — Ambulatory Visit: Payer: BC Managed Care – PPO | Admitting: Nurse Practitioner

## 2019-06-25 ENCOUNTER — Other Ambulatory Visit: Payer: Self-pay

## 2019-06-25 VITALS — BP 102/72 | HR 83 | Temp 98.1°F | Ht 67.6 in | Wt 288.0 lb

## 2019-06-25 DIAGNOSIS — Z6841 Body Mass Index (BMI) 40.0 and over, adult: Secondary | ICD-10-CM | POA: Diagnosis not present

## 2019-06-25 NOTE — Patient Instructions (Signed)
WALKING  Walking is a great form of exercise to increase your strength, endurance and overall fitness.  A walking program can help you start slowly and gradually build endurance as you go.  Everyone's ability is different, so each person's starting point will be different.  You do not have to follow them exactly.  The are just samples. You should simply find out what's right for you and stick to that program.   In the beginning, you'll start off walking 2-3 times a day for short distances.  As you get stronger, you'll be walking further at just 1-2 times per day.  A. You Can Walk For A Certain Length Of Time Each Day    Walk 5 minutes 3 times per day.  Increase 2 minutes every 2 days (3 times per day).  Work up to 25-30 minutes (1-2 times per day).   Example:   Day 1-2 5 minutes 3 times per day   Day 7-8 12 minutes 2-3 times per day   Day 13-14 25 minutes 1-2 times per day  B. You Can Walk For a Certain Distance Each Day     Distance can be substituted for time.    Example:   3 trips to mailbox (at road)   3 trips to corner of block   3 trips around the block  C. Go to local [employer] and use the track.     Please only do the exercises that your therapist has initialed and dated 

## 2019-06-25 NOTE — Progress Notes (Signed)
Subjective:     Patient ID: Alex Harrison , male    DOB: 11-Jul-1971 , 48 y.o.   MRN: [id number]   Chief Complaint  Patient presents with  . Weight Check    HPI  Here for obesity management - continues to take Saxenda daily.  He had slacked up at walking after his last visit.   He continues to eat sweets but not as much, he is eating vegetables with his meals.  Drinking about 4 bottles - 16oz.  Sleeping about 6 1/2 hours.  He does report possible snoring.  He will often dose off when watching tv.    Wt Readings from Last 3 Encounters: 06/25/19 : 288 lb (130.6 kg) 05/01/19 : 283 lb (128.4 kg) 04/23/19 : 283 lb 12.8 oz (128.7 kg)     Past Medical History:  Diagnosis Date  . Allergic rhinitis   . Allergy    seasonal allergic rhinitis  . Diabetes mellitus without complication ([place])   . Gout   . Hyperlipidemia   . Hypertension   . Left arm pain   . Left elbow pain   . Morbid obesity ([place])   . Neck nodule 09/16/10   right      Family History  Problem Relation Age of Onset  . Hypertension Mother   . Diabetes Mother   . Stroke Mother   . Hypertension Father   . Diabetes Father   . Diabetes Sister   . Hypertension Sister      Current Outpatient Medications:  .  allopurinol (ZYLOPRIM) 300 MG tablet, Take 1 tablet by mouth once daily, Disp: 90 tablet, Rfl: 0 .  atorvastatin (LIPITOR) 40 MG tablet, TAKE 1 TABLET BY MOUTH ONCE DAILY AT BEDTIME, Disp: 90 tablet, Rfl: 0 .  Calcium Carbonate (CALCIUM 600 PO), Take 1 tablet by mouth daily at 12 noon., Disp: , Rfl:  .  Cholecalciferol (VITAMIN D-3) 5000 UNITS TABS, Take by mouth.  , Disp: , Rfl:  .  diclofenac sodium (VOLTAREN) 1 % GEL, Apply 2 g topically 4 (four) times daily., Disp: 100 g, Rfl: 2 .  Liraglutide -Weight Management (SAXENDA) 18 MG/3ML SOPN, Inject 3 mg into the skin daily., Disp: 15 mL, Rfl: 0 .  MAGNESIUM GLUCONATE PO, Take 30 mg by mouth daily., Disp: , Rfl:  .  Omega-3 Fatty Acids (FISH OIL PO), Take by  mouth., Disp: , Rfl:    Allergies  Allergen Reactions  . Niaspan [Niacin Er]     Skin burning and itching and painful to touch.     Review of Systems  Constitutional: Negative.  Negative for fatigue.  Respiratory: Negative.  Negative for cough.   Cardiovascular: Negative.  Negative for chest pain, palpitations and leg swelling.  Neurological: Negative for dizziness and headaches.     Today's Vitals   06/25/19 0847  BP: 102/72  Pulse: 83  Temp: 98.1 F (36.7 C)  TempSrc: Oral  Weight: 288 lb (130.6 kg)  Height: 5' 7.6" (1.717 m)  PainSc: 0-No pain   Body mass index is 44.31 kg/m.   Objective:  Physical Exam Constitutional:      General: He is not in acute distress.    Appearance: Normal appearance. He is obese.  Cardiovascular:     Rate and Rhythm: Normal rate and regular rhythm.     Pulses: Normal pulses.     Heart sounds: Normal heart sounds. No murmur.  Pulmonary:     Effort: Pulmonary effort is normal.  Breath sounds: Normal breath sounds.  Skin:    General: Skin is warm and dry.     Capillary Refill: Capillary refill takes less than 2 seconds.  Neurological:     General: No focal deficit present.     Mental Status: He is alert and oriented to person, place, and time.  Psychiatric:        Mood and Affect: Mood normal.        Behavior: Behavior normal.        Thought Content: Thought content normal.        Judgment: Judgment normal.         Assessment And Plan:    1. Class 3 severe obesity without serious comorbidity with body mass index (BMI) of 40.0 to 44.9 in adult, unspecified obesity type (HCC)  Continue with Saxenda daily  Encouraged to increase his physical activity to 30 minutes per day 5 days per week  Increase water intake to at least 7 bottles- 16 oz.     Alex FeltsJanece Marti Mclane, FNP    THE PATIENT IS ENCOURAGED TO PRACTICE SOCIAL DISTANCING DUE TO THE COVID-19 PANDEMIC.

## 2019-07-03 ENCOUNTER — Other Ambulatory Visit: Payer: Self-pay

## 2019-07-03 ENCOUNTER — Ambulatory Visit: Payer: BC Managed Care – PPO | Admitting: Neurology

## 2019-07-03 ENCOUNTER — Encounter: Payer: Self-pay | Admitting: Neurology

## 2019-07-03 VITALS — BP 118/80 | HR 78 | Ht 67.0 in | Wt 286.0 lb

## 2019-07-03 DIAGNOSIS — R0683 Snoring: Secondary | ICD-10-CM | POA: Diagnosis not present

## 2019-07-03 DIAGNOSIS — R351 Nocturia: Secondary | ICD-10-CM | POA: Diagnosis not present

## 2019-07-03 DIAGNOSIS — G4719 Other hypersomnia: Secondary | ICD-10-CM | POA: Diagnosis not present

## 2019-07-03 DIAGNOSIS — Z6841 Body Mass Index (BMI) 40.0 and over, adult: Secondary | ICD-10-CM

## 2019-07-03 NOTE — Patient Instructions (Signed)

## 2019-07-03 NOTE — Progress Notes (Signed)
Subjective:    Patient ID: Alex Harrison is a 48 y.o. male.  HPI     Alex FoleySaima Esaias Cleavenger, MD, PhD Alex [facility] [address], [address] [address] [place], [place] [id number]  Dear Alex Harrison,   I saw your patient, Alex Harrison, upon your kind request in my sleep clinic today for initial consultation of his sleep disorder, in particular, concern for underlying obstructive sleep apnea.  The patient is unaccompanied today.  As you know, Alex Harrison is a 48 year old right-handed gentleman with an underlying medical history of allergies, diabetes, hypertension, hyperlipidemia, gout, and morbid obesity with a BMI of over 40, who reports snoring and excessive daytime somnolence.  I reviewed your office note from 06/25/2019. His Epworth sleepiness score is 7 out of 24, fatigue severity score is 9 out of 63.  He reports a tendency to doze off when he is sedentary, typically after work when he watches TV at home.  He lives with his wife, they have no pets, he has 1 grown daughter and step grandchildren.  He drinks caffeine occasionally in the form of tea, no daily coffee or soda or tea.  He is a non-smoker and does not utilize alcohol on a regular basis.  They have a TV in the bedroom but he does not typically watch it at night.  He goes to bed around 10 and rise time is 5:15 AM, he has nocturia about once per average night.  He works for the [employer].He has been trying to lose weight.  He has been on Saxenda.  He also sees a nutritionist about every 6 months.  His Past Medical History Is Significant For: Past Medical History:  Diagnosis Date  . Allergic rhinitis   . Allergy    seasonal allergic rhinitis  . Diabetes mellitus without complication (HCC)   . Gout   . Hyperlipidemia   . Hypertension   . Left arm pain   . Left elbow pain   . Morbid obesity (HCC)   . Neck nodule 09/16/10   right     His Past Surgical History Is Significant For: No  past surgical history on file.  His Family History Is Significant For: Family History  Problem Relation Age of Onset  . Hypertension Mother   . Diabetes Mother   . Stroke Mother   . Hypertension Father   . Diabetes Father   . Diabetes Sister   . Hypertension Sister     His Social History Is Significant For: Social History   Socioeconomic History  . Marital status: Married    Spouse name: Not on file  . Number of children: Not on file  . Years of Alex: Not on file  . Highest Alex level: Not on file  Occupational History  . Not on file  Social Needs  . Financial resource strain: Not on file  . Food insecurity    Worry: Not on file    Inability: Not on file  . Transportation needs    Medical: Not on file    Non-medical: Not on file  Tobacco Use  . Smoking status: Never Smoker  . Smokeless tobacco: Never Used  Substance and Sexual Activity  . Alcohol use: No  . Drug use: No  . Sexual activity: Not on file  Lifestyle  . Physical activity    Days per week: Not on file    Minutes per session: Not on file  . Stress: Not on file  Relationships  .  Social Musicianconnections    Talks on phone: Not on file    Gets together: Not on file    Attends religious service: Not on file    Active member of club or organization: Not on file    Attends meetings of clubs or organizations: Not on file    Relationship status: Not on file  Other Topics Concern  . Not on file  Social History Narrative  . Not on file    His Allergies Are:  Allergies  Allergen Reactions  . Niaspan [Niacin Er]     Skin burning and itching and painful to touch.  :   His Current Medications Are:  Outpatient Encounter Medications as of 07/03/2019  Medication Sig  . allopurinol (ZYLOPRIM) 300 MG tablet Take 1 tablet by mouth once daily  . atorvastatin (LIPITOR) 40 MG tablet TAKE 1 TABLET BY MOUTH ONCE DAILY AT BEDTIME  . BD PEN NEEDLE MICRO U/F 32G X 6 MM MISC USE WITH SAXENDA PEN AS DIRECTED  .  Calcium Carbonate (CALCIUM 600 PO) Take 1 tablet by mouth daily at 12 noon.  . Cholecalciferol (VITAMIN D-3) 5000 UNITS TABS Take by mouth.    . diclofenac sodium (VOLTAREN) 1 % GEL Apply 2 g topically 4 (four) times daily.  Marland Kitchen. MAGNESIUM GLUCONATE PO Take 30 mg by mouth daily.  . Omega-3 Fatty Acids (FISH OIL PO) Take by mouth.  Marland Kitchen. SAXENDA 18 MG/3ML SOPN INJECT 3 MG INTO THE SKIN DAILY   No facility-administered encounter medications on file as of 07/03/2019.   :   Review of Systems:  Out of a complete 14 point review of systems, all are reviewed and negative with the exception of these symptoms as listed below:  Review of Systems  Neurological:       Pt presents today to discuss his sleep. Pt does endorse snoring.  Epworth Sleepiness Scale 0= would never doze 1= slight chance of dozing 2= moderate chance of dozing 3= high chance of dozing  Sitting and reading: 0 Watching TV: 3 Sitting inactive in a public place (ex. Theater or meeting): 0 As a passenger in a car for an hour without a break: 2 Lying down to rest in the afternoon: 1 Sitting and talking to someone: 1 Sitting quietly after lunch (no alcohol): 0 In a car, while stopped in traffic: 0 Total: 7     Objective:  Neurological Exam  Physical Exam Physical Examination:   Vitals:   07/03/19 1047  BP: 118/80  Pulse: 78    General Examination: The patient is a very pleasant 48 y.o. male in no acute distress. He appears well-developed and well-nourished and well groomed.   HEENT: Normocephalic, atraumatic, pupils are equal, round and reactive to light and accommodation. Extraocular tracking is good without limitation to gaze excursion or nystagmus noted. Normal smooth pursuit is noted. Hearing is grossly intact. Face is symmetric with normal facial animation and normal facial sensation. Speech is clear with no dysarthria noted. There is no hypophonia. There is no lip, neck/head, jaw or voice tremor. Neck is supple with  full range of passive and active motion. There are no carotid bruits on auscultation. Oropharynx exam reveals: mild mouth dryness, adequate dental hygiene and moderate airway crowding, due to Larger/longer uvula, tonsils of about 2+, right side a little easier to see than left, Mallampati class II, neck circumference of 16-1/8 inches.  Tongue protrudes centrally in palate elevates symmetrically.  He has a moderate underbite.On nasal inspection, he has a  slightly deviated septum to the left.  Chest: Clear to auscultation without wheezing, rhonchi or crackles noted.  Heart: S1+S2+0, regular and normal without murmurs, rubs or gallops noted.   Abdomen: Soft, non-tender and non-distended with normal bowel sounds appreciated on auscultation.  Extremities: There is no pitting edema in the distal lower extremities bilaterally.  Skin: Warm and dry without trophic changes noted.  Musculoskeletal: exam reveals no obvious joint deformities, tenderness or joint swelling or erythema.   Neurologically:  Mental status: The patient is awake, alert and oriented in all 4 spheres. His immediate and remote memory, attention, language skills and fund of knowledge are appropriate. There is no evidence of aphasia, agnosia, apraxia or anomia. Speech is clear with normal prosody and enunciation. Thought process is linear. Mood is normal and affect is normal.  Cranial nerves II - XII are as described above under HEENT exam. In addition: shoulder shrug is normal with equal shoulder height noted. Motor exam: Normal bulk, strength and tone is noted. There is no drift, tremor or rebound. Romberg is negative. Reflexes are 2+ throughout. Fine motor skills and coordination: intact with normal finger taps, normal hand movements, normal rapid alternating patting, normal foot taps and normal foot agility.  Cerebellar testing: No dysmetria or intention tremor on finger to nose testing. Heel to shin is unremarkable bilaterally. There  is no truncal or gait ataxia.  Sensory exam: intact to light touch in the upper and lower extremities.  Gait, station and balance: He stands easily. No veering to one side is noted. No leaning to one side is noted. Posture is age-appropriate and stance is narrow based. Gait shows normal stride length and normal pace. No problems turning are noted. Tandem walk is unremarkable, after initial challenge.  Assessment and Plan:   In summary, Alex Harrison is a very pleasant 48 y.o.-year old male with an underlying medical history of allergies, diabetes, hypertension, hyperlipidemia, gout, and morbid obesity with a BMI of over 40, whose history and physical exam are concerning for obstructive sleep apnea (OSA). I had a long chat with the patient about my findings and the diagnosis of OSA, its prognosis and treatment options. We talked about medical treatments, surgical interventions and non-pharmacological approaches. I explained in particular the risks and ramifications of untreated moderate to severe OSA, especially with respect to developing cardiovascular disease down the Road, including congestive heart failure, difficult to treat hypertension, cardiac arrhythmias, or stroke. Even type 2 diabetes has, in part, been linked to untreated OSA. Symptoms of untreated OSA include daytime sleepiness, memory problems, mood irritability and mood disorder such as depression and anxiety, lack of energy, as well as recurrent headaches, especially morning headaches. We talked about trying to maintain a healthy lifestyle in general, as well as the importance of weight control. I encouraged the patient to eat healthy, exercise daily and keep well hydrated, to keep a scheduled bedtime and wake time routine, to not skip any meals and eat healthy snacks in between meals. I advised the patient not to drive when feeling sleepy. I recommended the following at this time: sleep study.   I explained the sleep test procedure to the  patient and also outlined possible surgical and non-surgical treatment options of OSA, including the use of a custom-made dental device (which would require a referral to a specialist dentist or oral surgeon, although having a Significant underbite will make him not a very good candidate for a dental device I explained to him), upper airway surgical options, such  as pillar implants, radiofrequency surgery, tongue base surgery, and UPPP (which would involve a referral to an ENT surgeon). Rarely, jaw surgery such as mandibular advancement may be considered.  I also explained the CPAP treatment option to the patient, who indicated that he would be willing to try CPAP if the need arises. I explained the importance of being compliant with PAP treatment, not only for insurance purposes but primarily to improve His symptoms, and for the patient's long term health benefit, including to reduce His cardiovascular risks. I answered all his questions today and the patient was in agreement. I plan to see him back after the sleep study is completed and encouraged him to call with any interim questions, concerns, problems or updates.   Thank you very much for allowing me to participate in the care of this nice patient. If I can be of any further assistance to you please do not hesitate to call me at [phone].  Sincerely,   Alex FoleySaima Montgomery Favor, MD, PhD

## 2019-07-22 ENCOUNTER — Other Ambulatory Visit: Payer: Self-pay | Admitting: Nurse Practitioner

## 2019-07-24 ENCOUNTER — Telehealth: Payer: Self-pay

## 2019-07-24 NOTE — Telephone Encounter (Signed)
Pt has been called multiple times to schedule sleep study ordered by Dr. Rexene Alberts.  On 07/10/2019:pt stated that he has a very high deductible and cannot afford cost of in lab sleep study. [employer]BS state [place] not approve a HST. A HST was offered to pt at cost. Pt is undecided a this time. Pt states he will call back at later date to schedule.

## 2019-09-13 ENCOUNTER — Other Ambulatory Visit: Payer: Self-pay | Admitting: Nurse Practitioner

## 2019-09-13 DIAGNOSIS — Z6841 Body Mass Index (BMI) 40.0 and over, adult: Secondary | ICD-10-CM

## 2019-09-15 ENCOUNTER — Other Ambulatory Visit: Payer: Self-pay | Admitting: Nurse Practitioner

## 2019-09-15 DIAGNOSIS — M109 Gout, unspecified: Secondary | ICD-10-CM

## 2019-09-24 ENCOUNTER — Other Ambulatory Visit (HOSPITAL_COMMUNITY)
Admission: RE | Admit: 2019-09-24 | Discharge: 2019-09-24 | Disposition: A | Discharge status: Home / Self Care | Payer: BC Managed Care – PPO | Source: Ambulatory Visit | Attending: Nurse Practitioner | Admitting: Nurse Practitioner

## 2019-09-24 ENCOUNTER — Encounter: Payer: Self-pay | Admitting: Nurse Practitioner

## 2019-09-24 ENCOUNTER — Ambulatory Visit: Payer: BC Managed Care – PPO | Admitting: Nurse Practitioner

## 2019-09-24 ENCOUNTER — Other Ambulatory Visit: Payer: Self-pay

## 2019-09-24 VITALS — BP 118/82 | HR 78 | Temp 98.0°F | Ht 67.0 in | Wt 295.2 lb

## 2019-09-24 DIAGNOSIS — I10 Essential (primary) hypertension: Secondary | ICD-10-CM

## 2019-09-24 DIAGNOSIS — M109 Gout, unspecified: Secondary | ICD-10-CM

## 2019-09-24 DIAGNOSIS — Z113 Encounter for screening for infections with a predominantly sexual mode of transmission: Secondary | ICD-10-CM

## 2019-09-24 DIAGNOSIS — E559 Vitamin D deficiency, unspecified: Secondary | ICD-10-CM

## 2019-09-24 DIAGNOSIS — Z1211 Encounter for screening for malignant neoplasm of colon: Secondary | ICD-10-CM | POA: Diagnosis not present

## 2019-09-24 DIAGNOSIS — Z23 Encounter for immunization: Secondary | ICD-10-CM

## 2019-09-24 DIAGNOSIS — Z0000 Encounter for general adult medical examination without abnormal findings: Secondary | ICD-10-CM

## 2019-09-24 DIAGNOSIS — E66813 Obesity, class 3: Secondary | ICD-10-CM

## 2019-09-24 DIAGNOSIS — R7303 Prediabetes: Secondary | ICD-10-CM | POA: Diagnosis not present

## 2019-09-24 DIAGNOSIS — E7849 Other hyperlipidemia: Secondary | ICD-10-CM

## 2019-09-24 DIAGNOSIS — Z6841 Body Mass Index (BMI) 40.0 and over, adult: Secondary | ICD-10-CM

## 2019-09-24 LAB — POCT UA - MICROALBUMIN
Albumin/Creatinine Ratio, Urine, POC: 30
Creatinine, POC: 200 mg/dL
Microalbumin Ur, POC: 10 mg/L

## 2019-09-24 LAB — POCT URINALYSIS DIPSTICK
Bilirubin, UA: NEGATIVE
Blood, UA: NEGATIVE
Glucose, UA: NEGATIVE
Ketones, UA: NEGATIVE
Leukocytes, UA: NEGATIVE
Nitrite, UA: NEGATIVE
Protein, UA: NEGATIVE
Spec Grav, UA: 1.02 (ref 1.010–1.025)
Urobilinogen, UA: 0.2 E.U./dL
pH, UA: 6 (ref 5.0–8.0)

## 2019-09-24 MED ORDER — TETANUS-DIPHTHERIA TOXOIDS TD 2-2 LF/0.5ML IM SUSP: 0.5000 mL | Freq: Once | INTRAMUSCULAR | 0 refills | Status: AC

## 2019-09-24 NOTE — Progress Notes (Signed)
Subjective:     Patient ID: Alex Harrison , male    DOB: Jun 08, 1971 , 48 y.o.   MRN: [id number]   Chief Complaint  Patient presents with  . Annual Exam    HPI   Here for HM  Wt Readings from Last 3 Encounters: 09/24/19 : 295 lb 3.2 oz (133.9 kg) 07/03/19 : 286 lb (129.7 kg) 06/25/19 : 288 lb (130.6 kg)  Continues to take [place].  He had started back walking again and slacked up.     Men's preventive visit. Patient Health Questionnaire (PHQ-2) is    Office Visit from 06/25/2019 in [facility]  PHQ-2 Total Score  0     Patient is on a regular diet. Marital status: Married. Relevant history for alcohol use is:  Social History   Substance and Sexual Activity  Alcohol Use No   Relevant history for tobacco use is:  Social History   Tobacco Use  Smoking Status Never Smoker  Smokeless Tobacco Never Used  . Past Medical History:  Diagnosis Date  . Allergic rhinitis   . Allergy    seasonal allergic rhinitis  . Diabetes mellitus without complication ([place])   . Gout   . Hyperlipidemia   . Hypertension   . Left arm pain   . Left elbow pain   . Morbid obesity ([place])   . Neck nodule 09/16/10   right      Family History  Problem Relation Age of Onset  . Hypertension Mother   . Diabetes Mother   . Stroke Mother   . Hypertension Father   . Diabetes Father   . Diabetes Sister   . Hypertension Sister      Current Outpatient Medications:  .  allopurinol (ZYLOPRIM) 300 MG tablet, Take 1 tablet by mouth once daily, Disp: 90 tablet, Rfl: 0 .  atorvastatin (LIPITOR) 40 MG tablet, TAKE 1 TABLET BY MOUTH ONCE DAILY AT BEDTIME, Disp: 90 tablet, Rfl: 0 .  BD PEN NEEDLE MICRO U/F 32G X 6 MM MISC, USE WITH SAXENDA PEN AS DIRECTED, Disp: 100 each, Rfl: 0 .  Calcium Carbonate (CALCIUM 600 PO), Take 1 tablet by mouth daily at 12 noon., Disp: , Rfl:  .  Cholecalciferol (VITAMIN D-3) 5000 UNITS TABS, Take by mouth.  , Disp: , Rfl:  .  diclofenac sodium  (VOLTAREN) 1 % GEL, Apply 2 g topically 4 (four) times daily., Disp: 100 g, Rfl: 2 .  MAGNESIUM GLUCONATE PO, Take 30 mg by mouth daily., Disp: , Rfl:  .  Omega-3 Fatty Acids (FISH OIL PO), Take by mouth., Disp: , Rfl:  .  SAXENDA 18 MG/3ML SOPN, INJECT 3MG  SUBCUTANEOUSLY ONCE DAILY, Disp: 15 mL, Rfl: 0   Allergies  Allergen Reactions  . Niaspan [Niacin Er]     Skin burning and itching and painful to touch.     Review of Systems  Constitutional: Negative.   HENT: Negative.   Eyes: Negative.   Respiratory: Negative.   Cardiovascular: Negative.  Negative for chest pain, palpitations and leg swelling.  Gastrointestinal: Negative.   Endocrine: Negative.   Genitourinary: Negative.   Musculoskeletal: Negative.   Skin: Negative.   Allergic/Immunologic: Negative.   Neurological: Negative.  Negative for dizziness and headaches.  Hematological: Negative.   Psychiatric/Behavioral: Negative.      Today's Vitals   09/24/19 0842  BP: 118/82  Pulse: 78  Temp: 98 F (36.7 C)  TempSrc: Oral  Weight: 295 lb 3.2 oz (133.9 kg)  Height: 5'  7" (1.702 m)   Body mass index is 46.23 kg/m.   Objective:  Physical Exam Vitals signs reviewed.  Constitutional:      Appearance: Normal appearance. He is obese.  HENT:     Head: Normocephalic and atraumatic.     Right Ear: Tympanic membrane, ear canal and external ear normal. There is no impacted cerumen.     Left Ear: Tympanic membrane, ear canal and external ear normal. There is no impacted cerumen.  Eyes:     Pupils: Pupils are equal, round, and reactive to light.  Neck:     Musculoskeletal: Normal range of motion and neck supple.  Cardiovascular:     Rate and Rhythm: Normal rate and regular rhythm.     Pulses: Normal pulses.     Heart sounds: Normal heart sounds. No murmur.  Pulmonary:     Effort: Pulmonary effort is normal. No respiratory distress.     Breath sounds: Normal breath sounds.  Abdominal:     General: Abdomen is flat.  Bowel sounds are normal. There is no distension.     Palpations: Abdomen is soft.  Genitourinary:    Prostate: Normal.     Rectum: Guaiac result negative.  Musculoskeletal: Normal range of motion.  Skin:    General: Skin is warm.     Capillary Refill: Capillary refill takes less than 2 seconds.     Comments: Right forearm with healing small 2nd degree burn.  Neurological:     General: No focal deficit present.     Mental Status: He is alert and oriented to person, place, and time.  Psychiatric:        Mood and Affect: Mood normal.        Behavior: Behavior normal.        Thought Content: Thought content normal.        Judgment: Judgment normal.         Assessment And Plan:     1. Routine adult health maintenance . Behavior modifications discussed and diet history reviewed.  . Pt will continue to exercise regularly and modify diet with low GI, plant based foods and decrease intake of processed foods.  . Recommend intake of daily multivitamin, Vitamin D, and calcium.  . Recommend mammogram and colonoscopy for preventive screenings, as well as recommend immunizations that include influenza, TDAP  2. Need for influenza vaccination  Influenza vaccine given in office  Advised to take Tylenol as needed for muscle aches or fever - Flu Vaccine QUAD 6+ mos PF IM (Fluarix Quad PF) - POCT Urinalysis Dipstick (81002) - EKG 12-Lead - POCT UA - Microalbumin  3. Prediabetes  Chronic, controlled  Diet and exercised controlled.  Diabetic foot exam done with decreased sensation to right foot otherwise normal.  Encouraged to limit intake of sugary foods and drinks  Encouraged to increase physical activity to 150 minutes per week, I have encouraged him if he is going to the gym to be sure to wear a mask due to the current pandemic  4. Class 3 severe obesity due to excess calories without serious comorbidity with body mass index (BMI) of 45.0 to 49.9 in adult [facility]) Chronic Discussed  healthy diet and regular exercise options  Encouraged to exercise at least 150 minutes per week with 2 days of strength training He continues to take Saxenda daily, he has had a 5lb weight gain. I have encouraged him to increase his physical activity. Return in 2 months for weight check.  5. Hypertension, unspecified  type . B/P is controlled.  . CMP ordered to check renal function.  . The importance of regular exercise and dietary modification was stressed to the patient.  . Stressed importance of losing ten percent of her body weight to help with B/P control.   6. Encounter for immunization  Will give tetanus vaccine today while in office. Refer to order management. TDAP will be administered to adults 6418-50 years old every 10 years. - diptheria-tetanus toxoids [facility](DECAVAC) 2-2 LF/0.5ML injection; Inject 0.5 mLs into the muscle once for 1 dose.  Dispense: 0.5 mL; Refill: 0   Arnette FeltsJanece Tametra Ahart, FNP    THE PATIENT IS ENCOURAGED TO PRACTICE SOCIAL DISTANCING DUE TO THE COVID-19 PANDEMIC.

## 2019-09-24 NOTE — Patient Instructions (Addendum)
Health Maintenance  Topic Date Due  . PNEUMOCOCCAL POLYSACCHARIDE VACCINE AGE 50-64 HIGH RISK  11/04/1971  . OPHTHALMOLOGY EXAM  03/05/2015  . URINE MICROALBUMIN  09/21/2019  . TETANUS/TDAP  11/13/2019  . HEMOGLOBIN A1C  03/24/2020  . FOOT EXAM  09/23/2020  . INFLUENZA VACCINE  Completed  . HIV Screening  Completed   Health Maintenance, Male Adopting a healthy lifestyle and getting preventive care are important in promoting health and wellness. Ask your health care provider about:  The right schedule for you to have regular tests and exams.  Things you can do on your own to prevent diseases and keep yourself healthy. What should I know about diet, weight, and exercise? Eat a healthy diet   Eat a diet that includes plenty of vegetables, fruits, low-fat dairy products, and lean protein.  Do not eat a lot of foods that are high in solid fats, added sugars, or sodium. Maintain a healthy weight Body mass index (BMI) is a measurement that can be used to identify possible weight problems. It estimates body fat based on height and weight. Your health care provider can help determine your BMI and help you achieve or maintain a healthy weight. Get regular exercise Get regular exercise. This is one of the most important things you can do for your health. Most adults should:  Exercise for at least 150 minutes each week. The exercise should increase your heart rate and make you sweat (moderate-intensity exercise).  Do strengthening exercises at least twice a week. This is in addition to the moderate-intensity exercise.  Spend less time sitting. Even light physical activity can be beneficial. Watch cholesterol and blood lipids Have your blood tested for lipids and cholesterol at 50 years of age, then have this test every 5 years. You may need to have your cholesterol levels checked more often if:  Your lipid or cholesterol levels are high.  You are older than 50 years of age.  You are at  high risk for heart disease. What should I know about cancer screening? Many types of cancers can be detected early and may often be prevented. Depending on your health history and family history, you may need to have cancer screening at various ages. This may include screening for:  Colorectal cancer.  Prostate cancer.  Skin cancer.  Lung cancer. What should I know about heart disease, diabetes, and high blood pressure? Blood pressure and heart disease  High blood pressure causes heart disease and increases the risk of stroke. This is more likely to develop in people who have high blood pressure readings, are of African descent, or are overweight.  Talk with your health care provider about your target blood pressure readings.  Have your blood pressure checked: ? Every 3-5 years if you are 55-70 years of age. ? Every year if you are 58 years old or older.  If you are between the ages of 29 and 59 and are a current or former smoker, ask your health care provider if you should have a one-time screening for abdominal aortic aneurysm (AAA). Diabetes Have regular diabetes screenings. This checks your fasting blood sugar level. Have the screening done:  Once every three years after age 37 if you are at a normal weight and have a low risk for diabetes.  More often and at a younger age if you are overweight or have a high risk for diabetes. What should I know about preventing infection? Hepatitis B If you have a higher risk for hepatitis  B, you should be screened for this virus. Talk with your health care provider to find out if you are at risk for hepatitis B infection. Hepatitis C Blood testing is recommended for:  Everyone born from [id number] through 1965.  Anyone with known risk factors for hepatitis C. Sexually transmitted infections (STIs)  You should be screened each year for STIs, including gonorrhea and chlamydia, if: ? You are sexually active and are younger than 50 years of  age. ? You are older than 50 years of age and your health care provider tells you that you are at risk for this type of infection. ? Your sexual activity has changed since you were last screened, and you are at increased risk for chlamydia or gonorrhea. Ask your health care provider if you are at risk.  Ask your health care provider about whether you are at high risk for HIV. Your health care provider may recommend a prescription medicine to help prevent HIV infection. If you choose to take medicine to prevent HIV, you should first get tested for HIV. You should then be tested every 3 months for as long as you are taking the medicine. Follow these instructions at home: Lifestyle  Do not use any products that contain nicotine or tobacco, such as cigarettes, e-cigarettes, and chewing tobacco. If you need help quitting, ask your health care provider.  Do not use street drugs.  Do not share needles.  Ask your health care provider for help if you need support or information about quitting drugs. Alcohol use  Do not drink alcohol if your health care provider tells you not to drink.  If you drink alcohol: ? Limit how much you have to 0-2 drinks a day. ? Be aware of how much alcohol is in your drink. In the [place] one drink equals one 12 oz bottle of beer (355 mL), one 5 oz glass of wine (148 mL), or one 1 oz glass of hard liquor (44 mL). General instructions  Schedule regular health, dental, and eye exams.  Stay current with your vaccines.  Tell your health care provider if: ? You often feel depressed. ? You have ever been abused or do not feel safe at home. Summary  Adopting a healthy lifestyle and getting preventive care are important in promoting health and wellness.  Follow your health care provider's instructions about healthy diet, exercising, and getting tested or screened for diseases.  Follow your health care provider's instructions on monitoring your cholesterol and blood  pressure. This information is not intended to replace advice given to you by your health care provider. Make sure you discuss any questions you have with your health care provider. Document Released: 05/27/2008 Document Revised: 11/22/2018 Document Reviewed: 11/22/2018 Elsevier Patient Education  2020 Elsevier Inc.  Influenza Virus Vaccine (Flucelvax) What is this medicine? INFLUENZA VIRUS VACCINE (in floo EN zuh VAHY ruhs vak SEEN) helps to reduce the risk of getting influenza also known as the flu. The vaccine only helps protect you against some strains of the flu. This medicine may be used for other purposes; ask your health care provider or pharmacist if you have questions. COMMON BRAND NAME(S): FLUCELVAX What should I tell my health care provider before I take this medicine? They need to know if you have any of these conditions:  bleeding disorder like hemophilia  fever or infection  Guillain-Barre syndrome or other neurological problems  immune system problems  infection with the human immunodeficiency virus (HIV) or AIDS  low blood platelet  counts  multiple sclerosis  an unusual or allergic reaction to influenza virus vaccine, other medicines, foods, dyes or preservatives  pregnant or trying to get pregnant  breast-feeding How should I use this medicine? This vaccine is for injection into a muscle. It is given by a health care professional. A copy of Vaccine Information Statements will be given before each vaccination. Read this sheet carefully each time. The sheet may change frequently. Talk to your pediatrician regarding the use of this medicine in children. Special care may be needed. Overdosage: If you think you've taken too much of this medicine contact a poison control center or emergency room at once. Overdosage: If you think you have taken too much of this medicine contact a poison control center or emergency room at once. NOTE: This medicine is only for you. Do  not share this medicine with others. What if I miss a dose? This does not apply. What may interact with this medicine?  chemotherapy or radiation therapy  medicines that lower your immune system like etanercept, anakinra, infliximab, and adalimumab  medicines that treat or prevent blood clots like warfarin  phenytoin  steroid medicines like prednisone or cortisone  theophylline  vaccines This list may not describe all possible interactions. Give your health care provider a list of all the medicines, herbs, non-prescription drugs, or dietary supplements you use. Also tell them if you smoke, drink alcohol, or use illegal drugs. Some items may interact with your medicine. What should I watch for while using this medicine? Report any side effects that do not go away within 3 days to your doctor or health care professional. Call your health care provider if any unusual symptoms occur within 6 weeks of receiving this vaccine. You may still catch the flu, but the illness is not usually as bad. You cannot get the flu from the vaccine. The vaccine will not protect against colds or other illnesses that may cause fever. The vaccine is needed every year. What side effects may I notice from receiving this medicine? Side effects that you should report to your doctor or health care professional as soon as possible:  allergic reactions like skin rash, itching or hives, swelling of the face, lips, or tongue Side effects that usually do not require medical attention (Report these to your doctor or health care professional if they continue or are bothersome.):  fever  headache  muscle aches and pains  pain, tenderness, redness, or swelling at the injection site  tiredness This list may not describe all possible side effects. Call your doctor for medical advice about side effects. You may report side effects to FDA at 1-800-FDA-1088. Where should I keep my medicine? The vaccine will be given by a  health care professional in a clinic, pharmacy, doctor's office, or other health care setting. You will not be given vaccine doses to store at home. NOTE: This sheet is a summary. It may not cover all possible information. If you have questions about this medicine, talk to your doctor, pharmacist, or health care provider.  2020 Elsevier/Gold Standard (2011-11-10 14:06:47)

## 2019-09-25 LAB — CMP14 + ANION GAP
ALT: 22 IU/L (ref 0–44)
AST: 17 IU/L (ref 0–40)
Albumin/Globulin Ratio: 1.6 (ref 1.2–2.2)
Albumin: 4.2 g/dL (ref 4.0–5.0)
Alkaline Phosphatase: 81 IU/L (ref 39–117)
Anion Gap: 17 mmol/L (ref 10.0–18.0)
BUN/Creatinine Ratio: 10 (ref 9–20)
BUN: 10 mg/dL (ref 6–24)
Bilirubin Total: 0.7 mg/dL (ref 0.0–1.2)
CO2: 20 mmol/L (ref 20–29)
Calcium: 9.5 mg/dL (ref 8.7–10.2)
Chloride: 104 mmol/L (ref 96–106)
Creatinine, Ser: 1.03 mg/dL (ref 0.76–1.27)
GFR calc Af Amer: 98 mL/min/{1.73_m2} (ref >59)
GFR calc non Af Amer: 85 mL/min/{1.73_m2} (ref >59)
Globulin, Total: 2.6 g/dL (ref 1.5–4.5)
Glucose: 80 mg/dL (ref 65–99)
Potassium: 4.3 mmol/L (ref 3.5–5.2)
Sodium: 141 mmol/L (ref 134–144)
Total Protein: 6.8 g/dL (ref 6.0–8.5)

## 2019-09-25 LAB — HEMOGLOBIN A1C
Est. average glucose Bld gHb Est-mCnc: 108 mg/dL
Hgb A1c MFr Bld: 5.4 % (ref 4.8–5.6)

## 2019-09-25 LAB — URINE CYTOLOGY ANCILLARY ONLY
Chlamydia: NEGATIVE
Neisseria Gonorrhea: NEGATIVE
Trichomonas: NEGATIVE

## 2019-09-25 LAB — LIPID PANEL
Chol/HDL Ratio: 3.4 ratio (ref 0.0–5.0)
Cholesterol, Total: 146 mg/dL (ref 100–199)
HDL: 43 mg/dL (ref >39)
LDL Chol Calc (NIH): 90 mg/dL (ref 0–99)
Triglycerides: 67 mg/dL (ref 0–149)
VLDL Cholesterol Cal: 13 mg/dL (ref 5–40)

## 2019-09-25 LAB — T PALLIDUM SCREENING CASCADE: T pallidum Antibodies (TP-PA): NONREACTIVE

## 2019-09-25 LAB — HIV ANTIBODY (ROUTINE TESTING W REFLEX): HIV Screen 4th Generation wRfx: NONREACTIVE

## 2019-09-25 LAB — URIC ACID: Uric Acid: 4.7 mg/dL (ref 3.7–8.6)

## 2019-09-25 LAB — VITAMIN D 25 HYDROXY (VIT D DEFICIENCY, FRACTURES): Vit D, 25-Hydroxy: 60.6 ng/mL (ref 30.0–100.0)

## 2019-10-08 ENCOUNTER — Telehealth (INDEPENDENT_AMBULATORY_CARE_PROVIDER_SITE_OTHER): Payer: BC Managed Care – PPO | Admitting: Nurse Practitioner

## 2019-10-08 ENCOUNTER — Encounter: Payer: Self-pay | Admitting: Nurse Practitioner

## 2019-10-08 ENCOUNTER — Other Ambulatory Visit: Payer: Self-pay

## 2019-10-08 VITALS — Wt 295.0 lb

## 2019-10-08 DIAGNOSIS — Z20828 Contact with and (suspected) exposure to other viral communicable diseases: Secondary | ICD-10-CM | POA: Diagnosis not present

## 2019-10-08 DIAGNOSIS — Z20822 Contact with and (suspected) exposure to covid-19: Secondary | ICD-10-CM

## 2019-10-08 NOTE — Progress Notes (Signed)
Virtual Visit via Video   This visit type was conducted due to national recommendations for restrictions regarding the COVID-19 Pandemic (e.g. social distancing) in an effort to limit this patient's exposure and mitigate transmission in our community.  Due to his co-morbid illnesses, this patient is at least at moderate risk for complications without adequate follow up.  This format is felt to be most appropriate for this patient at this time.  All issues noted in this document were discussed and addressed.  A limited physical exam was performed with this format.    This visit type was conducted due to national recommendations for restrictions regarding the COVID-19 Pandemic (e.g. social distancing) in an effort to limit this patient's exposure and mitigate transmission in our community.  Patients identity confirmed using two different identifiers.  This format is felt to be most appropriate for this patient at this time.  All issues noted in this document were discussed and addressed.  No physical exam was performed (except for noted visual exam findings with Video Visits).    Date:  10/08/2019   ID:  Alex Harrison, DOB [birth year], MRN [id number]  Patient Location:  Home - spoke with Alex Harrison  Provider location:   Office    Chief Complaint:  Exposure to Covid 19  History of Present Illness:    Alex Harrison is a 48 y.o. male who presents via video conferencing for a telehealth visit today.    The patient does not have symptoms concerning for COVID-19 infection (fever, chills, cough, or new shortness of breath).   He was exposed to a coworker who tested positive for coronavirus, he had been around this person for at least 2 hours without a mask. Denies a symptoms.  He has been advised to be out of work for 14 days before returning to work. He works around [place] so they will not wear their mask regularly outside even if less than 6 feet.     Past Medical History:  Diagnosis  Date  . Allergic rhinitis   . Allergy    seasonal allergic rhinitis  . Diabetes mellitus without complication ([place])   . Gout   . Hyperlipidemia   . Hypertension   . Left arm pain   . Left elbow pain   . Morbid obesity ([place])   . Neck nodule 09/16/10   right    History reviewed. No pertinent surgical history.   Current Meds  Medication Sig  . allopurinol (ZYLOPRIM) 300 MG tablet Take 1 tablet by mouth once daily  . atorvastatin (LIPITOR) 40 MG tablet TAKE 1 TABLET BY MOUTH ONCE DAILY AT BEDTIME  . BD PEN NEEDLE MICRO U/F 32G X 6 MM MISC USE WITH SAXENDA PEN AS DIRECTED  . Calcium Carbonate (CALCIUM 600 PO) Take 1 tablet by mouth daily at 12 noon.  . Cholecalciferol (VITAMIN D-3) 5000 UNITS TABS Take by mouth.    . diclofenac sodium (VOLTAREN) 1 % GEL Apply 2 g topically 4 (four) times daily.  Marland Kitchen MAGNESIUM GLUCONATE PO Take 30 mg by mouth daily.  . Omega-3 Fatty Acids (FISH OIL PO) Take by mouth.  Marland Kitchen SAXENDA 18 MG/3ML SOPN INJECT 3MG  SUBCUTANEOUSLY ONCE DAILY     Allergies:   Niaspan [niacin er]   Social History   Tobacco Use  . Smoking status: Never Smoker  . Smokeless tobacco: Never Used  Substance Use Topics  . Alcohol use: No  . Drug use: No     Family Hx: The patient's family history  includes Diabetes in his father, mother, and sister; Hypertension in his father, mother, and sister; Stroke in his mother.  ROS:   Please see the history of present illness.    Review of Systems  Constitutional: Negative.   Respiratory: Negative.  Negative for cough.   Cardiovascular: Negative.   Neurological: Negative for dizziness and headaches.  Psychiatric/Behavioral: Negative.     All other systems reviewed and are negative.   Labs/Other Tests and Data Reviewed:    Recent Labs: 09/24/2019: ALT 22; BUN 10; Creatinine, Ser 1.03; Potassium 4.3; Sodium 141   Recent Lipid Panel Lab Results  Component Value Date/Time   CHOL 146 09/24/2019 09:47 AM   CHOL 98 04/24/2013 09:25  AM   TRIG 67 09/24/2019 09:47 AM   TRIG 54 07/26/2013 10:15 AM   TRIG 67 04/24/2013 09:25 AM   HDL 43 09/24/2019 09:47 AM   HDL 47 07/26/2013 10:15 AM   HDL 36 (L) 04/24/2013 09:25 AM   CHOLHDL 3.4 09/24/2019 09:47 AM   LDLCALC 90 09/24/2019 09:47 AM   LDLCALC 51 07/26/2013 10:15 AM   LDLCALC 49 04/24/2013 09:25 AM    Wt Readings from Last 3 Encounters:  10/08/19 295 lb (133.8 kg)  09/24/19 295 lb 3.2 oz (133.9 kg)  07/03/19 286 lb (129.7 kg)     Exam:    Vital Signs:  Wt 295 lb (133.8 kg)   BMI 46.20 kg/m     Physical Exam  Constitutional: No distress.  Pulmonary/Chest: Effort normal. No respiratory distress.  Neurological: He is alert.  Psychiatric: Mood, memory, affect and judgment normal.    ASSESSMENT & PLAN:    1. Close exposure to COVID-19 virus  No current symptoms, advised needs to be self isolated for 14 days, he can go for a test at a local [facility] or health department and to send [place] a copy of results.   He is advised if he has shortness of breath to go to the ER  Also advised to wear a mask when less than 6 feet    COVID-19 Education: The signs and symptoms of COVID-19 were discussed with the patient and how to seek care for testing (follow up with PCP or arrange E-visit).  The importance of social distancing was discussed today.  Patient Risk:   After full review of this patients clinical status, I feel that they are at least moderate risk at this time.  Time:   Today, I have spent 10 minutes/ seconds with the patient with telehealth technology discussing above diagnoses.     Medication Adjustments/Labs and Tests Ordered: Current medicines are reviewed at length with the patient today.  Concerns regarding medicines are outlined above.   Tests Ordered: No orders of the defined types were placed in this encounter.   Medication Changes: No orders of the defined types were placed in this encounter.   Disposition:  Follow up prn  Signed, [name], FNP

## 2019-10-09 ENCOUNTER — Telehealth: Payer: BC Managed Care – PPO | Admitting: Nurse Practitioner

## 2019-10-11 ENCOUNTER — Telehealth: Payer: Self-pay

## 2019-10-11 ENCOUNTER — Encounter: Payer: Self-pay | Admitting: Nurse Practitioner

## 2019-10-11 NOTE — Telephone Encounter (Signed)
Called pt he informed me that he tested positive for covid . Pt stated that he is not having any symptoms. Pt was instructed to call if he started to have symptoms and if they were really bad for him to go to the emergency room. Pt was advised to self quarantine.

## 2019-10-12 ENCOUNTER — Other Ambulatory Visit: Payer: Self-pay | Admitting: Nurse Practitioner

## 2019-10-12 DIAGNOSIS — Z6841 Body Mass Index (BMI) 40.0 and over, adult: Secondary | ICD-10-CM

## 2019-10-12 MED ORDER — SAXENDA 18 MG/3ML ~~LOC~~ SOPN: 3.0000 mg | PEN_INJECTOR | Freq: Every day | SUBCUTANEOUS | 1 refills | Status: DC

## 2019-10-12 MED ORDER — BD PEN NEEDLE MICRO U/F 32G X 6 MM MISC: 1 refills | Status: DC

## 2019-10-31 ENCOUNTER — Ambulatory Visit: Payer: BC Managed Care – PPO | Admitting: Nutrition

## 2019-11-19 ENCOUNTER — Ambulatory Visit: Payer: BC Managed Care – PPO | Admitting: Nurse Practitioner

## 2019-11-19 ENCOUNTER — Encounter: Payer: Self-pay | Admitting: Nurse Practitioner

## 2019-11-19 ENCOUNTER — Other Ambulatory Visit: Payer: Self-pay

## 2019-11-19 VITALS — BP 114/70 | HR 81 | Temp 97.9°F | Ht 68.2 in | Wt 302.4 lb

## 2019-11-19 DIAGNOSIS — R7303 Prediabetes: Secondary | ICD-10-CM

## 2019-11-19 DIAGNOSIS — Z1211 Encounter for screening for malignant neoplasm of colon: Secondary | ICD-10-CM | POA: Diagnosis not present

## 2019-11-19 DIAGNOSIS — Z23 Encounter for immunization: Secondary | ICD-10-CM | POA: Diagnosis not present

## 2019-11-19 DIAGNOSIS — Z8619 Personal history of other infectious and parasitic diseases: Secondary | ICD-10-CM

## 2019-11-19 DIAGNOSIS — Z6841 Body Mass Index (BMI) 40.0 and over, adult: Secondary | ICD-10-CM

## 2019-11-19 DIAGNOSIS — I10 Essential (primary) hypertension: Secondary | ICD-10-CM

## 2019-11-19 DIAGNOSIS — Z8616 Personal history of COVID-19: Secondary | ICD-10-CM

## 2019-11-19 MED ORDER — SAXENDA 18 MG/3ML ~~LOC~~ SOPN: 3.0000 mg | PEN_INJECTOR | Freq: Every day | SUBCUTANEOUS | 1 refills | Status: DC

## 2019-11-19 MED ORDER — TETANUS-DIPHTH-ACELL PERTUSSIS 5-2.5-18.5 LF-MCG/0.5 IM SUSP
0.5000 mL | Freq: Once | INTRAMUSCULAR | Status: AC
  Administered 2019-11-19: 0.5 mL via INTRAMUSCULAR

## 2019-11-19 NOTE — Progress Notes (Addendum)
Subjective:     Patient ID: Alex Harrison , male    DOB: 21-Apr-1971 , 48 y.o.   MRN: [id number]   Chief Complaint  Patient presents with  . Weight Check    HPI  Here for obesity management - continues to take Saxenda daily.  He had tested positive for coronavirus after being tested due to a coworker was positive.  He had a headache and sinus symptoms this was in October.    Wt Readings from Last 3 Encounters: 11/19/19 : (!) 302 lb 6.4 oz (137.2 kg) 10/08/19 : 295 lb (133.8 kg) 09/24/19 : 295 lb 3.2 oz (133.9 kg)      Past Medical History:  Diagnosis Date  . Allergic rhinitis   . Allergy    seasonal allergic rhinitis  . Diabetes mellitus without complication ([place])   . Gout   . Hyperlipidemia   . Hypertension   . Left arm pain   . Left elbow pain   . Morbid obesity ([place])   . Neck nodule 09/16/10   right      Family History  Problem Relation Age of Onset  . Hypertension Mother   . Diabetes Mother   . Stroke Mother   . Hypertension Father   . Diabetes Father   . Diabetes Sister   . Hypertension Sister      Current Outpatient Medications:  .  allopurinol (ZYLOPRIM) 300 MG tablet, Take 1 tablet by mouth once daily, Disp: 90 tablet, Rfl: 0 .  atorvastatin (LIPITOR) 40 MG tablet, TAKE 1 TABLET BY MOUTH ONCE DAILY AT BEDTIME, Disp: 90 tablet, Rfl: 0 .  Calcium Carbonate (CALCIUM 600 PO), Take 1 tablet by mouth daily at 12 noon., Disp: , Rfl:  .  Cholecalciferol (VITAMIN D-3) 5000 UNITS TABS, Take by mouth.  , Disp: , Rfl:  .  diclofenac sodium (VOLTAREN) 1 % GEL, Apply 2 g topically 4 (four) times daily., Disp: 100 g, Rfl: 2 .  Insulin Pen Needle (BD PEN NEEDLE MICRO U/F) 32G X 6 MM MISC, Use as directed with Saxenda pen, Disp: 100 each, Rfl: 1 .  Liraglutide -Weight Management (SAXENDA) 18 MG/3ML SOPN, Inject 3 mg into the skin daily at 2 am., Disp: 15 mL, Rfl: 1 .  MAGNESIUM GLUCONATE PO, Take 30 mg by mouth daily., Disp: , Rfl:  .  Omega-3 Fatty Acids (FISH OIL  PO), Take by mouth., Disp: , Rfl:    Allergies  Allergen Reactions  . Niaspan [Niacin Er]     Skin burning and itching and painful to touch.     Review of Systems  Constitutional: Negative.  Negative for chills and fatigue.  Respiratory: Negative.  Negative for cough.   Cardiovascular: Negative.  Negative for chest pain, palpitations and leg swelling.  Neurological: Negative for dizziness and headaches.     Today's Vitals   11/19/19 0831  BP: 114/70  Pulse: 81  Temp: 97.9 F (36.6 C)  TempSrc: Oral  Weight: (!) 302 lb 6.4 oz (137.2 kg)  Height: 5' 8.2" (1.732 m)  PainSc: 0-No pain   Body mass index is 45.71 kg/m.   Objective:  Physical Exam Constitutional:      General: He is not in acute distress.    Appearance: Normal appearance. He is obese.  Cardiovascular:     Rate and Rhythm: Normal rate and regular rhythm.     Pulses: Normal pulses.     Heart sounds: Normal heart sounds. No murmur.  Pulmonary:  Effort: Pulmonary effort is normal.     Breath sounds: Normal breath sounds.  Skin:    General: Skin is warm and dry.     Capillary Refill: Capillary refill takes less than 2 seconds.  Neurological:     General: No focal deficit present.     Mental Status: He is alert and oriented to person, place, and time.  Psychiatric:        Mood and Affect: Mood normal.        Behavior: Behavior normal.        Thought Content: Thought content normal.        Judgment: Judgment normal.         Assessment And Plan:   1. Prediabetes Chronic, stable  No labs this visit I have requested his eye exam from [facility] Doctor again in [place]  2. Hypertension, unspecified type Chronic, excellent control  Continue with current medications  3. Class 3 severe obesity due to excess calories without serious comorbidity with body mass index (BMI) of 45.0 to 49.9 in adult [facility]) Chronic, unfortunately he has gained approximately 7 lbs since his last visit due to being diagnosed  with covid 19 Continue with saxenda daily and he is awaiting an appt with the weight loss clinic - Liraglutide -Weight Management (SAXENDA) 18 MG/3ML SOPN; Inject 3 mg into the skin daily at 2 am.  Dispense: 15 mL; Refill: 1  4. Encounter for screening for malignant neoplasm of colon  According to USPTF Colorectal cancer Screening guidelines. Colonoscopy is recommended every 10 years, starting at age 64years.  Will refer to GI for colon cancer screening. - Ambulatory referral to Gastroenterology  5. History of 2019 novel coronavirus disease (COVID-19)  He is doing better now, this was in October and he described having sinus symptoms and was only checked due to a coworker being positive  6. Encounter for immunization  Will give tetanus vaccine today while in office. Refer to order management. TDAP will be administered to adults 65-73 years old every 10 years. - Pneumococcal polysaccharide vaccine 23-valent greater than or equal to 2yo subcutaneous/IM - Tdap (BOOSTRIX) injection 0.5 mL    [name], FNP    THE PATIENT IS ENCOURAGED TO PRACTICE SOCIAL DISTANCING DUE TO THE COVID-19 PANDEMIC.

## 2019-11-20 DIAGNOSIS — R7303 Prediabetes: Secondary | ICD-10-CM | POA: Diagnosis not present

## 2019-11-20 DIAGNOSIS — Z23 Encounter for immunization: Secondary | ICD-10-CM | POA: Diagnosis not present

## 2019-11-22 ENCOUNTER — Encounter: Payer: Self-pay | Admitting: Internal Medicine

## 2019-12-18 ENCOUNTER — Encounter: Payer: Self-pay | Admitting: Nurse Practitioner

## 2019-12-18 ENCOUNTER — Other Ambulatory Visit: Payer: Self-pay

## 2019-12-18 ENCOUNTER — Telehealth (INDEPENDENT_AMBULATORY_CARE_PROVIDER_SITE_OTHER): Payer: BC Managed Care – PPO | Admitting: Nurse Practitioner

## 2019-12-18 VITALS — Temp 97.9°F

## 2019-12-18 DIAGNOSIS — Z8619 Personal history of other infectious and parasitic diseases: Secondary | ICD-10-CM

## 2019-12-18 DIAGNOSIS — Z20828 Contact with and (suspected) exposure to other viral communicable diseases: Secondary | ICD-10-CM | POA: Diagnosis not present

## 2019-12-18 DIAGNOSIS — R059 Cough, unspecified: Secondary | ICD-10-CM

## 2019-12-18 DIAGNOSIS — R05 Cough: Secondary | ICD-10-CM | POA: Diagnosis not present

## 2019-12-18 MED ORDER — HYDROCODONE-HOMATROPINE 5-1.5 MG/5ML PO SYRP: 5.0000 mL | ORAL_SOLUTION | Freq: Four times a day (QID) | ORAL | Status: DC | PRN

## 2019-12-18 MED ORDER — HYDROCODONE-HOMATROPINE 5-1.5 MG/5ML PO SYRP: 5.0000 mL | ORAL_SOLUTION | Freq: Four times a day (QID) | ORAL | 0 refills | Status: DC | PRN

## 2019-12-18 NOTE — Patient Instructions (Signed)
10 Things You Can Do to Manage Your COVID-19 Symptoms at Home If you have possible or confirmed COVID-19: 1. Stay home from work and school. And stay away from other public places. If you must go out, avoid using any kind of public transportation, ridesharing, or taxis. 2. Monitor your symptoms carefully. If your symptoms get worse, call your healthcare provider immediately. 3. Get rest and stay hydrated. 4. If you have a medical appointment, call the healthcare provider ahead of time and tell them that you have or may have COVID-19. 5. For medical emergencies, call 911 and notify the dispatch personnel that you have or may have COVID-19. 6. Cover your cough and sneezes with a tissue or use the inside of your elbow. 7. Wash your hands often with soap and water for at least 20 seconds or clean your hands with an alcohol-based hand sanitizer that contains at least 60% alcohol. 8. As much as possible, stay in a specific room and away from other people in your home. Also, you should use a separate bathroom, if available. If you need to be around other people in or outside of the home, wear a mask. 9. Avoid sharing personal items with other people in your household, like dishes, towels, and bedding. 10. Clean all surfaces that are touched often, like counters, tabletops, and doorknobs. Use household cleaning sprays or wipes according to the label instructions. cdc.gov/coronavirus 06/13/2019 This information is not intended to replace advice given to you by your health care provider. Make sure you discuss any questions you have with your health care provider. Document Revised: 11/15/2019 Document Reviewed: 11/15/2019 Elsevier Patient Education  2020 Elsevier Inc.  

## 2019-12-18 NOTE — Progress Notes (Signed)
Virtual Visit via Video   This visit type was conducted due to national recommendations for restrictions regarding the COVID-19 Pandemic (e.g. social distancing) in an effort to limit this patient's exposure and mitigate transmission in our community.  Due to his co-morbid illnesses, this patient is at least at moderate risk for complications without adequate follow up.  This format is felt to be most appropriate for this patient at this time.  All issues noted in this document were discussed and addressed.  A limited physical exam was performed with this format.    This visit type was conducted due to national recommendations for restrictions regarding the COVID-19 Pandemic (e.g. social distancing) in an effort to limit this patient's exposure and mitigate transmission in our community.  Patients identity confirmed using two different identifiers.  This format is felt to be most appropriate for this patient at this time.  All issues noted in this document were discussed and addressed.  No physical exam was performed (except for noted visual exam findings with Video Visits).    Date:  12/18/2019   ID:  Kallie Edward, DOB 1971-10-26, MRN [id number]  Patient Location:  Home - spoke with Kallie Edward  Provider location:   Office    Chief Complaint:  Persistent new onset cough  History of Present Illness:    Cornelious Bartolucci is a 49 y.o. male who presents via video conferencing for a telehealth visit today.    The patient does have symptoms concerning for COVID-19 infection (fever, chills, cough, or new shortness of breath).   He had covid in October and had been doing well since that time.  He was at a Christmas dinner on 12/07/2019.   No additional symptoms currently other than persistent cough.  His wife also has a cough.   Cough This is a recurrent problem. The current episode started in the past 7 days (2 days). The problem has been gradually worsening. The problem occurs every few  minutes. The cough is non-productive. Pertinent negatives include no chest pain, ear congestion, fever, headaches, sore throat or shortness of breath. He has tried OTC cough suppressant (Delsym, theraflu) for the symptoms. There is no history of asthma, emphysema or environmental allergies.     Past Medical History:  Diagnosis Date  . Allergic rhinitis   . Allergy    seasonal allergic rhinitis  . Diabetes mellitus without complication (HCC)   . Gout   . Hyperlipidemia   . Hypertension   . Left arm pain   . Left elbow pain   . Morbid obesity (HCC)   . Neck nodule 09/16/10   right    History reviewed. No pertinent surgical history.   Current Meds  Medication Sig  . allopurinol (ZYLOPRIM) 300 MG tablet Take 1 tablet by mouth once daily  . atorvastatin (LIPITOR) 40 MG tablet TAKE 1 TABLET BY MOUTH ONCE DAILY AT BEDTIME  . Calcium Carbonate (CALCIUM 600 PO) Take 1 tablet by mouth daily at 12 noon.  . Cholecalciferol (VITAMIN D-3) 5000 UNITS TABS Take by mouth.    . diclofenac sodium (VOLTAREN) 1 % GEL Apply 2 g topically 4 (four) times daily.  . Insulin Pen Needle (BD PEN NEEDLE MICRO U/F) 32G X 6 MM MISC Use as directed with Saxenda pen  . Liraglutide -Weight Management (SAXENDA) 18 MG/3ML SOPN Inject 3 mg into the skin daily at 2 am.  . MAGNESIUM GLUCONATE PO Take 30 mg by mouth daily.  . Omega-3 Fatty Acids (FISH OIL PO)  Take by mouth.     Allergies:   Niaspan [niacin er]   Social History   Tobacco Use  . Smoking status: Never Smoker  . Smokeless tobacco: Never Used  Substance Use Topics  . Alcohol use: No  . Drug use: No     Family Hx: The patient's family history includes Diabetes in his father, mother, and sister; Hypertension in his father, mother, and sister; Stroke in his mother.  ROS:   Please see the history of present illness.    Review of Systems  Constitutional: Negative for fever.  HENT: Negative for sore throat.   Respiratory: Positive for cough.  Negative for sputum production and shortness of breath.   Cardiovascular: Negative.  Negative for chest pain.  Genitourinary: Negative.   Neurological: Negative.  Negative for headaches.  Endo/Heme/Allergies: Negative for environmental allergies.  Psychiatric/Behavioral: Negative.     All other systems reviewed and are negative.   Labs/Other Tests and Data Reviewed:    Recent Labs: 09/24/2019: ALT 22; BUN 10; Creatinine, Ser 1.03; Potassium 4.3; Sodium 141   Recent Lipid Panel Lab Results  Component Value Date/Time   CHOL 146 09/24/2019 09:47 AM   CHOL 98 04/24/2013 09:25 AM   TRIG 67 09/24/2019 09:47 AM   TRIG 54 07/26/2013 10:15 AM   TRIG 67 04/24/2013 09:25 AM   HDL 43 09/24/2019 09:47 AM   HDL 47 07/26/2013 10:15 AM   HDL 36 (L) 04/24/2013 09:25 AM   CHOLHDL 3.4 09/24/2019 09:47 AM   LDLCALC 90 09/24/2019 09:47 AM   LDLCALC 51 07/26/2013 10:15 AM   LDLCALC 49 04/24/2013 09:25 AM    Wt Readings from Last 3 Encounters:  11/19/19 (!) 302 lb 6.4 oz (137.2 kg)  10/08/19 295 lb (133.8 kg)  09/24/19 295 lb 3.2 oz (133.9 kg)     Exam:    Vital Signs:  Temp 97.9 F (36.6 C)     Physical Exam  Constitutional: He is oriented to person, place, and time and well-developed, well-nourished, and in no distress. No distress.  Pulmonary/Chest: Effort normal. No respiratory distress.  Neurological: He is alert and oriented to person, place, and time.  Psychiatric: Mood, memory, affect and judgment normal.    ASSESSMENT & PLAN:    1. Cough  Persistent cough over the last 2 days.  He is going to set up an appt at [facility] for a covid test  Advised if has shortness of breath to go to ER  Hycodan cough syrup sent to pharmacy and advised to not drive or operate heavy machinery when taking medication  He is advised to remain self isolated until he has his results  - HYDROcodone-homatropine (HYDROMET) 5-1.5 MG/5ML syrup; Take 5 mLs by mouth every 6 (six) hours as needed.   Dispense: 120 mL; Refill: 0    COVID-19 Education: The signs and symptoms of COVID-19 were discussed with the patient and how to seek care for testing (follow up with PCP or arrange E-visit).  The importance of social distancing was discussed today.  Patient Risk:   After full review of this patients clinical status, I feel that they are at least moderate risk at this time.  Time:   Today, I have spent 9 minutes/ seconds with the patient with telehealth technology discussing above diagnoses.     Medication Adjustments/Labs and Tests Ordered: Current medicines are reviewed at length with the patient today.  Concerns regarding medicines are outlined above.   Tests Ordered: No orders of the  defined types were placed in this encounter.   Medication Changes: Meds ordered this encounter  Medications  . DISCONTD: HYDROcodone-homatropine (HYCODAN) 5-1.5 MG/5ML syrup 5 mL  . HYDROcodone-homatropine (HYDROMET) 5-1.5 MG/5ML syrup    Sig: Take 5 mLs by mouth every 6 (six) hours as needed.    Dispense:  120 mL    Refill:  0    Disposition:  Follow up prn  Signed, [name], FNP

## 2019-12-19 ENCOUNTER — Ambulatory Visit: Payer: BC Managed Care – PPO | Attending: Internal Medicine

## 2019-12-19 DIAGNOSIS — Z20822 Contact with and (suspected) exposure to covid-19: Secondary | ICD-10-CM

## 2019-12-20 LAB — NOVEL CORONAVIRUS, NAA: SARS-CoV-2, NAA: DETECTED — AB

## 2019-12-21 ENCOUNTER — Telehealth: Payer: Self-pay | Admitting: *Deleted

## 2019-12-21 NOTE — Telephone Encounter (Signed)
See result note for COVID info

## 2019-12-28 ENCOUNTER — Other Ambulatory Visit: Payer: Self-pay | Admitting: Nurse Practitioner

## 2019-12-28 DIAGNOSIS — M109 Gout, unspecified: Secondary | ICD-10-CM

## 2019-12-31 MED ORDER — ALLOPURINOL 300 MG PO TABS: 300.0000 mg | ORAL_TABLET | Freq: Every day | ORAL | 0 refills | Status: DC

## 2020-01-02 ENCOUNTER — Other Ambulatory Visit: Payer: Self-pay

## 2020-01-02 ENCOUNTER — Encounter: Payer: Self-pay | Admitting: Nutrition

## 2020-01-02 ENCOUNTER — Encounter: Payer: BC Managed Care – PPO | Attending: Nurse Practitioner | Admitting: Nutrition

## 2020-01-02 DIAGNOSIS — E782 Mixed hyperlipidemia: Secondary | ICD-10-CM | POA: Insufficient documentation

## 2020-01-02 DIAGNOSIS — E119 Type 2 diabetes mellitus without complications: Secondary | ICD-10-CM | POA: Insufficient documentation

## 2020-01-02 NOTE — Patient Instructions (Signed)
  Goal Keep up the great job! Get back to working out when you can or walk outside after work .Lose 3-4 lbs per month Keep eating high fiber foods and lots of low carb vegetables and fresh fruits

## 2020-01-02 NOTE — Progress Notes (Signed)
Phone visit due to recent COVID Medical Nutrition Therapy:  Appt start time: 1610 end time: 1625  Assessment:  Primary concerns today: Obesity, hyperlipidemia, and Type 2 DM diet controlled. Back on Secsenda. Lost 15 lbs since Decemenber 2019. Hasn't been able to go to gym due to COVID 19  but doing yard work. Getting is strength back slowly. Fatigued. Taste and appetite are coming back slowly.  Last  A1C good 5.4%. Had tested positive COVID on Jan 6th and has been out of quarantine now.   Lab Results  Component Value Date   HGBA1C 5.4 09/24/2019   Vitals with BMI 04/23/2019 02/16/2019 12/18/2018 11/29/2018  Height 5' 7.4" 5\' 7"  5' 7.4" 5\' 7"   Weight 283 lbs 13 oz 282 lbs 10 oz 288 lbs 13 oz 298 lbs  BMI 43.92 44.25 44.7 46.66  Systolic 100 116   Diastolic 82 74 72   Pulse 67 75 63   Respirations       Lipid Panel     Component Value Date/Time   CHOL 146 09/24/2019 0947   CHOL 98 04/24/2013 0925   TRIG 67 09/24/2019 0947   TRIG 54 07/26/2013 1015   TRIG 67 04/24/2013 0925   HDL 43 09/24/2019 0947   HDL 47 07/26/2013 1015   HDL 36 (L) 04/24/2013 0925   CHOLHDL 3.4 09/24/2019 0947   LDLCALC 90 09/24/2019 0947   LDLCALC 51 07/26/2013 1015   LDLCALC 49 04/24/2013 0925    Vitals with BMI 08/30/2018  Height 5\' 8"   Weight 289 lbs  BMI 43.95  Systolic   Diastolic   Pulse   Respirations    Learning preference:   No preference indicated   Learning Readiness:  Ready  Change in progress  MEDICATIONS:    DIETARY INTAKE:   24-hr recall:  B ( AM): Egg  Sandwich, Apple, , water Snk ( AM): water  L ( PM):   2 apple water Snk ( PM): D ( PM): spaghetti and apple, water Snk ( PM):  Beverages: water  Usual physical activity: walking.3 miles 2-3 times per week.  Estimated energy needs: 1800  calories 200  g carbohydrates 135 g protein 50 g fat  Progress Towards Goal(s):  In progress.   Nutritional Diagnosis:  NI-1.5 Excessive energy intake As related to high  fat high salt high sugar diet.  As evidenced by BMI > 40.    Intervention: Nutrition and Diabetes education provided on My Plate, CHO counting, meal planning, portion sizes, timing of meals, avoiding snacks between meals unless having a low blood sugar, target ranges for A1C and blood sugars, signs/symptoms and treatment of hyper/hypoglycemia, monitoring blood sugars, taking medications as prescribed, benefits of exercising 30 minutes per day and prevention of complications of DM. Healthy Weight loss tips, benefit of exercise, portion sizes, drinking water and need for consistency of timing of meals.   Goal Keep up the great job! Get back to working out when you can or walk outside after work .Lose 3-4 lbs per month Keep eating high fiber foods and lots of low carb vegetables and fresh fruits.   Teaching Method Utilized:  Visual Auditory Hands on  Handouts given during visit include:  The Plate Method   Meal Plan Card   Barriers to learning/adherence to lifestyle change: none  Demonstrated degree of understanding via:  Teach Back   Monitoring/Evaluation:  Dietary intake, exercise, meal planning, and body weight in 6 month(s).

## 2020-01-07 ENCOUNTER — Encounter: Payer: BC Managed Care – PPO | Admitting: Internal Medicine

## 2020-01-30 ENCOUNTER — Other Ambulatory Visit: Payer: Self-pay | Admitting: Nurse Practitioner

## 2020-02-01 ENCOUNTER — Other Ambulatory Visit: Payer: Self-pay | Admitting: Nurse Practitioner

## 2020-02-01 DIAGNOSIS — Z6841 Body Mass Index (BMI) 40.0 and over, adult: Secondary | ICD-10-CM

## 2020-02-04 ENCOUNTER — Other Ambulatory Visit: Payer: Self-pay | Admitting: Nurse Practitioner

## 2020-02-04 MED ORDER — SAXENDA 18 MG/3ML ~~LOC~~ SOPN: 3.0000 mL | PEN_INJECTOR | Freq: Once | SUBCUTANEOUS | 0 refills | Status: DC

## 2020-02-04 MED ORDER — SAXENDA 18 MG/3ML ~~LOC~~ SOPN: 3.0000 mg | PEN_INJECTOR | Freq: Once | SUBCUTANEOUS | 1 refills | Status: AC

## 2020-02-18 ENCOUNTER — Encounter: Payer: Self-pay | Admitting: Nurse Practitioner

## 2020-02-19 ENCOUNTER — Encounter: Payer: Self-pay | Admitting: Nurse Practitioner

## 2020-02-19 ENCOUNTER — Encounter: Payer: Self-pay | Admitting: Internal Medicine

## 2020-02-19 ENCOUNTER — Ambulatory Visit: Payer: BC Managed Care – PPO | Admitting: Nurse Practitioner

## 2020-02-19 ENCOUNTER — Other Ambulatory Visit: Payer: Self-pay

## 2020-02-19 VITALS — BP 122/84 | HR 102 | Temp 98.3°F | Ht 67.0 in | Wt 305.2 lb

## 2020-02-19 DIAGNOSIS — Z6841 Body Mass Index (BMI) 40.0 and over, adult: Secondary | ICD-10-CM | POA: Diagnosis not present

## 2020-02-19 DIAGNOSIS — R7303 Prediabetes: Secondary | ICD-10-CM

## 2020-02-19 DIAGNOSIS — I10 Essential (primary) hypertension: Secondary | ICD-10-CM

## 2020-02-19 DIAGNOSIS — E7849 Other hyperlipidemia: Secondary | ICD-10-CM

## 2020-02-19 DIAGNOSIS — E559 Vitamin D deficiency, unspecified: Secondary | ICD-10-CM

## 2020-02-19 NOTE — Patient Instructions (Signed)
Weigh yourself when you get home so we can compare the weights due to a significant change since your last virtual visit and today's visit

## 2020-02-19 NOTE — Progress Notes (Signed)
This visit occurred during the SARS-CoV-2 public health emergency.  Safety protocols were in place, including screening questions prior to the visit, additional usage of staff PPE, and extensive cleaning of exam room while observing appropriate contact time as indicated for disinfecting solutions.  Subjective:     Patient ID: Alex Harrison , male    DOB: 11/17/1971 , 48 y.o.   MRN: [id number]   Chief Complaint  Patient presents with  . Weight Check    HPI  Here for weight check - continues to take Saxenda daily.  He took his first covid vaccine yesterday at [employer].   Wt Readings from Last 3 Encounters: 02/19/20 : (!) 305 lb 3.2 oz (138.4 kg) 01/02/20 : 288 lb (130.6 kg) 11/19/19 : (!) 302 lb 6.4 oz (137.2 kg)  He has been taking his medication daily as directed. He feels he has been eating more junk food. Plans to start back walking once it warms up.   His scale at home was 299.6 lbs.      Past Medical History:  Diagnosis Date  . Allergic rhinitis   . Allergy    seasonal allergic rhinitis  . Diabetes mellitus without complication (HCC)   . Gout   . Hyperlipidemia   . Hypertension   . Left arm pain   . Left elbow pain   . Morbid obesity (HCC)   . Neck nodule 09/16/10   right      Family History  Problem Relation Age of Onset  . Hypertension Mother   . Diabetes Mother   . Stroke Mother   . Hypertension Father   . Diabetes Father   . Diabetes Sister   . Hypertension Sister      Current Outpatient Medications:  .  allopurinol (ZYLOPRIM) 300 MG tablet, Take 1 tablet (300 mg total) by mouth daily., Disp: 90 tablet, Rfl: 0 .  atorvastatin (LIPITOR) 40 MG tablet, TAKE 1 TABLET BY MOUTH ONCE DAILY AT BEDTIME, Disp: 90 tablet, Rfl: 0 .  Calcium Carbonate (CALCIUM 600 PO), Take 1 tablet by mouth daily at 12 noon., Disp: , Rfl:  .  Cholecalciferol (VITAMIN D-3) 5000 UNITS TABS, Take by mouth.  , Disp: , Rfl:  .  Insulin Pen Needle (BD PEN NEEDLE  MICRO U/F) 32G X 6 MM MISC, Use as directed with Saxenda pen, Disp: 100 each, Rfl: 1 .  MAGNESIUM GLUCONATE PO, Take 30 mg by mouth daily., Disp: , Rfl:  .  Omega-3 Fatty Acids (FISH OIL PO), Take by mouth., Disp: , Rfl:    Allergies  Allergen Reactions  . Niaspan [Niacin Er]     Skin burning and itching and painful to touch.     Review of Systems  Constitutional: Negative.   Respiratory: Negative.   Cardiovascular: Negative.   Neurological: Negative.  Negative for dizziness and headaches.  Hematological: Negative.   Psychiatric/Behavioral: Negative.      Today's Vitals   02/19/20 0857  BP: 122/84  Pulse: (!) 102  Temp: 98.3 F (36.8 C)  TempSrc: Oral  SpO2: 96%  Weight: (!) 305 lb 3.2 oz (138.4 kg)  Height: 5' 7" (1.702 m)   Body mass index is 47.8 kg/m.   Objective:  Physical Exam Constitutional:      Appearance: Normal appearance.  Cardiovascular:     Rate and Rhythm: Normal rate and regular rhythm.     Pulses: Normal pulses.     Heart sounds: Normal heart sounds. No murmur.  Skin:      Capillary Refill: Capillary refill takes less than 2 seconds.  Neurological:     General: No focal deficit present.     Mental Status: He is alert and oriented to person, place, and time.  Psychiatric:        Mood and Affect: Mood normal.        Behavior: Behavior normal.        Thought Content: Thought content normal.        Judgment: Judgment normal.         Assessment And Plan:     1. Hypertension, unspecified type . B/P is well controlled.  . CMP ordered to check renal function.  . The importance of regular exercise and dietary modification was stressed to the patient.  . Stressed importance of losing ten percent of her body weight to help with B/P control.  - CMP14+EGFR  2. Prediabetes  Chronic, controlled  No current medications  Encouraged to limit intake of sugary foods and drinks  Encouraged to increase physical activity to 150 minutes per weeks -  Hemoglobin A1c  3. Vitamin D deficiency  Will check vitamin D level and supplement as needed.     Also encouraged to spend 15 minutes in the sun daily.  - VITAMIN D 25 Hydroxy (Vit-D Deficiency, Fractures)  4. Morbid obesity with BMI of 40.0-44.9, adult [facility])  He continues with Saxenda, his is encouraged to increase his physical actiivty  I do feel the Kirke Shaggy has been helpful for his weight.  5. Other hyperlipidemia  Chronic, stable  Continue with current medications. - Lipid panel    [name], FNP    THE PATIENT IS ENCOURAGED TO PRACTICE SOCIAL DISTANCING DUE TO THE COVID-19 PANDEMIC.

## 2020-02-20 ENCOUNTER — Encounter: Payer: Self-pay | Admitting: Nurse Practitioner

## 2020-02-20 LAB — CMP14+EGFR
ALT: 61 IU/L — ABNORMAL HIGH (ref 0–44)
AST: 31 IU/L (ref 0–40)
Albumin/Globulin Ratio: 1.8 (ref 1.2–2.2)
Albumin: 4.3 g/dL (ref 4.0–5.0)
Alkaline Phosphatase: 80 IU/L (ref 39–117)
BUN/Creatinine Ratio: 10 (ref 9–20)
BUN: 9 mg/dL (ref 6–24)
Bilirubin Total: 1.1 mg/dL (ref 0.0–1.2)
CO2: 24 mmol/L (ref 20–29)
Calcium: 9.5 mg/dL (ref 8.7–10.2)
Chloride: 102 mmol/L (ref 96–106)
Creatinine, Ser: 0.88 mg/dL (ref 0.76–1.27)
GFR calc Af Amer: 116 mL/min/{1.73_m2} (ref >59)
GFR calc non Af Amer: 100 mL/min/{1.73_m2} (ref >59)
Globulin, Total: 2.4 g/dL (ref 1.5–4.5)
Glucose: 82 mg/dL (ref 65–99)
Potassium: 4.4 mmol/L (ref 3.5–5.2)
Sodium: 140 mmol/L (ref 134–144)
Total Protein: 6.7 g/dL (ref 6.0–8.5)

## 2020-02-20 LAB — LIPID PANEL
Chol/HDL Ratio: 3.1 ratio (ref 0.0–5.0)
Cholesterol, Total: 132 mg/dL (ref 100–199)
HDL: 43 mg/dL (ref >39)
LDL Chol Calc (NIH): 74 mg/dL (ref 0–99)
Triglycerides: 76 mg/dL (ref 0–149)
VLDL Cholesterol Cal: 15 mg/dL (ref 5–40)

## 2020-02-20 LAB — HEMOGLOBIN A1C
Est. average glucose Bld gHb Est-mCnc: 111 mg/dL
Hgb A1c MFr Bld: 5.5 % (ref 4.8–5.6)

## 2020-02-20 LAB — VITAMIN D 25 HYDROXY (VIT D DEFICIENCY, FRACTURES): Vit D, 25-Hydroxy: 57 ng/mL (ref 30.0–100.0)

## 2020-02-29 ENCOUNTER — Encounter: Payer: Self-pay | Admitting: Nurse Practitioner

## 2020-03-08 ENCOUNTER — Other Ambulatory Visit: Payer: Self-pay | Admitting: Nurse Practitioner

## 2020-03-08 DIAGNOSIS — Z6841 Body Mass Index (BMI) 40.0 and over, adult: Secondary | ICD-10-CM

## 2020-03-18 ENCOUNTER — Ambulatory Visit (AMBULATORY_SURGERY_CENTER): Payer: Self-pay | Admitting: *Deleted

## 2020-03-18 ENCOUNTER — Other Ambulatory Visit: Payer: Self-pay

## 2020-03-18 VITALS — Temp 97.1°F | Ht 67.0 in | Wt 305.8 lb

## 2020-03-18 DIAGNOSIS — Z1211 Encounter for screening for malignant neoplasm of colon: Secondary | ICD-10-CM

## 2020-03-18 MED ORDER — SUTAB 1479-225-188 MG PO TABS: 24.0000 | ORAL_TABLET | ORAL | 0 refills | Status: DC

## 2020-03-18 NOTE — Progress Notes (Signed)
Wife temp in pv 97.1 Pt completed covid vaccine series 03-18-2020- will be 2 weeks at 4-20 colon   No egg or soy allergy known to patient  No  past sedation with any surgeries  or procedures, no past  intubation   No diet pills per patient No home 02 use per patient  No blood thinners per patient  Pt denies issues with constipation  No A fib or A flutter  EMMI video sent to pt's e mail   Due to the COVID-19 pandemic we are asking patients to follow these guidelines. Please only bring one care partner. Please be aware that your care partner may wait in the car in the parking lot or if they feel like they will be too hot to wait in the car, they may wait in the lobby on the 4th floor. All care partners are required to wear a mask the entire time (we do not have any that we can provide them), they need to practice social distancing, and we will do a Covid check for all patient's and care partners when you arrive. Also we will check their temperature and your temperature. If the care partner waits in their car they need to stay in the parking lot the entire time and we will call them on their cell phone when the patient is ready for discharge so they can bring the car to the front of the building. Also all patient's will need to wear a mask into building . sutab code and coupon to pt

## 2020-03-24 ENCOUNTER — Telehealth: Payer: Self-pay | Admitting: Internal Medicine

## 2020-03-24 DIAGNOSIS — Z1211 Encounter for screening for malignant neoplasm of colon: Secondary | ICD-10-CM

## 2020-03-24 MED ORDER — SUTAB 1479-225-188 MG PO TABS: 24.0000 | ORAL_TABLET | ORAL | 0 refills | Status: DC

## 2020-03-24 NOTE — Telephone Encounter (Signed)
Sutab resent to [facility] as asked per Wife

## 2020-03-24 NOTE — Telephone Encounter (Signed)
Pt's wife reported that [facility] pharmacy has not received [employer].  Please resend.

## 2020-03-26 NOTE — Telephone Encounter (Signed)
Pt called stating that [facility] has not yet received prescription for suprep. He is asking if we can call it in again.

## 2020-03-26 NOTE — Telephone Encounter (Signed)
Called patient, no answer, left a message to call me back.

## 2020-03-26 NOTE — Telephone Encounter (Signed)
I spoke with the patient, he has not called the pharmacy today. I explained to him that yesterday I called the [facility] and gave the sutab rx over the phone to the pharmacist. He will call them today to check.

## 2020-03-27 ENCOUNTER — Telehealth: Payer: Self-pay

## 2020-03-27 NOTE — Telephone Encounter (Signed)
LMOM to call back

## 2020-03-27 NOTE — Telephone Encounter (Signed)
I called pt to advise him that his saxenda has been denied and we wanted to know if he wanted to try a different medication. He stated yes I advised him the med would be sent and it may need a PA so we will get that taken care of [facility]

## 2020-03-27 NOTE — Telephone Encounter (Signed)
Pt states prep is $200.  He is agreeable to picking up a prep sample- he will pick up on Friday

## 2020-03-28 ENCOUNTER — Encounter: Payer: Self-pay | Admitting: Internal Medicine

## 2020-04-01 ENCOUNTER — Ambulatory Visit (AMBULATORY_SURGERY_CENTER): Payer: BC Managed Care – PPO | Admitting: Internal Medicine

## 2020-04-01 ENCOUNTER — Other Ambulatory Visit: Payer: Self-pay

## 2020-04-01 ENCOUNTER — Encounter: Payer: Self-pay | Admitting: Internal Medicine

## 2020-04-01 VITALS — BP 119/81 | HR 65 | Temp 97.1°F | Resp 12 | Ht 67.0 in | Wt 305.0 lb

## 2020-04-01 DIAGNOSIS — Z1211 Encounter for screening for malignant neoplasm of colon: Secondary | ICD-10-CM

## 2020-04-01 MED ORDER — SODIUM CHLORIDE 0.9 % IV SOLN: 500.0000 mL | Freq: Once | INTRAVENOUS | Status: DC

## 2020-04-01 NOTE — Op Note (Signed)
[facility] Patient Name: Alex Harrison Procedure Date: 04/01/2020 8:51 AM MRN: [id number] Endoscopist: Beverley Fiedler , MD Age: 49 Referring MD:  Date of Birth: [birth year] Gender: Male Account #: [id number] Procedure:                Colonoscopy Indications:              Screening for colorectal malignant neoplasm, This                            is the patient's first colonoscopy Medicines:                Monitored Anesthesia Care Procedure:                Pre-Anesthesia Assessment:                           - Prior to the procedure, a History and Physical                            was performed, and patient medications and                            allergies were reviewed. The patient's tolerance of                            previous anesthesia was also reviewed. The risks                            and benefits of the procedure and the sedation                            options and risks were discussed with the patient.                            All questions were answered, and informed consent                            was obtained. Prior Anticoagulants: The patient has                            taken no previous anticoagulant or antiplatelet                            agents. ASA Grade Assessment: III - A patient with                            severe systemic disease. After reviewing the risks                            and benefits, the patient was deemed in                            satisfactory condition to undergo the procedure.  After obtaining informed consent, the colonoscope                            was passed under direct vision. Throughout the                            procedure, the patient's blood pressure, pulse, and                            oxygen saturations were monitored continuously. The                            Colonoscope was introduced through the anus and                            advanced to the cecum,  identified by appendiceal                            orifice and ileocecal valve. The colonoscopy was                            performed without difficulty. The patient tolerated                            the procedure well. The quality of the bowel                            preparation was good. The ileocecal valve,                            appendiceal orifice, and rectum were photographed. Scope In: 9:01:16 AM Scope Out: 9:14:00 AM Scope Withdrawal Time: 0 hours 10 minutes 48 seconds  Total Procedure Duration: 0 hours 12 minutes 44 seconds  Findings:                 The digital rectal exam was normal.                           The entire examined colon appeared normal on direct                            and retroflexion views. Complications:            No immediate complications. Estimated Blood Loss:     Estimated blood loss: none. Impression:               - The entire examined colon is normal on direct and                            retroflexion views.                           - No specimens collected. Recommendation:           - Patient has a contact number available for  emergencies. The signs and symptoms of potential                            delayed complications were discussed with the                            patient. Return to normal activities tomorrow.                            Written discharge instructions were provided to the                            patient.                           - Resume previous diet.                           - Continue present medications.                           - Repeat colonoscopy in 10 years for screening                            purposes. Beverley Fiedler, MD 04/01/2020 9:16:05 AM This report has been signed electronically.

## 2020-04-01 NOTE — Progress Notes (Signed)
Report given to PACU, vss 

## 2020-04-01 NOTE — Patient Instructions (Signed)
Discharge instructions given. Normal exam. Resume previous medications. YOU HAD AN ENDOSCOPIC PROCEDURE TODAY AT THE [facility]:   Refer to the procedure report that was given to you for any specific questions about what was found during the examination.  If the procedure report does not answer your questions, please call your gastroenterologist to clarify.  If you requested that your care partner not be given the details of your procedure findings, then the procedure report has been included in a sealed envelope for you to review at your convenience later.  YOU SHOULD EXPECT: Some feelings of bloating in the abdomen. Passage of more gas than usual.  Walking can help get rid of the air that was put into your GI tract during the procedure and reduce the bloating. If you had a lower endoscopy (such as a colonoscopy or flexible sigmoidoscopy) you may notice spotting of blood in your stool or on the toilet paper. If you underwent a bowel prep for your procedure, you may not have a normal bowel movement for a few days.  Please Note:  You might notice some irritation and congestion in your nose or some drainage.  This is from the oxygen used during your procedure.  There is no need for concern and it should clear up in a day or so.  SYMPTOMS TO REPORT IMMEDIATELY:  Following lower endoscopy (colonoscopy or flexible sigmoidoscopy):  Excessive amounts of blood in the stool  Significant tenderness or worsening of abdominal pains  Swelling of the abdomen that is new, acute  Fever of 100F or higher   For urgent or emergent issues, a gastroenterologist can be reached at any hour by calling [phone]. Do not use MyChart messaging for urgent concerns.    DIET:  We do recommend a small meal at first, but then you may proceed to your regular diet.  Drink plenty of fluids but you should avoid alcoholic beverages for 24 hours.  ACTIVITY:  You should plan to take it easy for the rest of  today and you should NOT DRIVE or use heavy machinery until tomorrow (because of the sedation medicines used during the test).    FOLLOW UP: Our staff will call the number listed on your records 48-72 hours following your procedure to check on you and address any questions or concerns that you may have regarding the information given to you following your procedure. If we do not reach you, we will leave a message.  We will attempt to reach you two times.  During this call, we will ask if you have developed any symptoms of COVID 19. If you develop any symptoms (ie: fever, flu-like symptoms, shortness of breath, cough etc.) before then, please call [phone].  If you test positive for Covid 19 in the 2 weeks post procedure, please call and report this information to us.    If any biopsies were taken you will be contacted by phone or by letter within the next 1-3 weeks.  Please call us at [phone] if you have not heard about the biopsies in 3 weeks.    SIGNATURES/CONFIDENTIALITY: You and/or your care partner have signed paperwork which will be entered into your electronic medical record.  These signatures attest to the fact that that the information above on your After Visit Summary has been reviewed and is understood.  Full responsibility of the confidentiality of this discharge information lies with you and/or your care-partner.  

## 2020-04-01 NOTE — Progress Notes (Signed)
Cw- vitals JB- temp

## 2020-04-01 NOTE — Progress Notes (Signed)
Pt's states no medical or surgical changes since previsit or office visit. 

## 2020-04-03 ENCOUNTER — Telehealth: Payer: Self-pay | Admitting: *Deleted

## 2020-04-03 NOTE — Telephone Encounter (Signed)
  Follow up Call-  Call back number 04/01/2020  Post procedure Call Back phone  # [phone]  Permission to leave phone message Yes  Some recent data might be hidden     Patient questions:  Message left to call us if necessary.  Second call.

## 2020-04-03 NOTE — Telephone Encounter (Signed)
  Follow up Call-  Call back number 04/01/2020  Post procedure Call Back phone  # [phone]  Permission to leave phone message Yes  Some recent data might be hidden     Patient questions:  Message left to call us if necessary.

## 2020-04-08 ENCOUNTER — Other Ambulatory Visit: Payer: Self-pay | Admitting: Nurse Practitioner

## 2020-04-08 DIAGNOSIS — E785 Hyperlipidemia, unspecified: Secondary | ICD-10-CM

## 2020-04-08 DIAGNOSIS — M109 Gout, unspecified: Secondary | ICD-10-CM

## 2020-04-30 ENCOUNTER — Ambulatory Visit: Payer: BC Managed Care – PPO | Admitting: Nutrition

## 2020-05-09 IMAGING — CT CT RENAL STONE PROTOCOL
2 of 4 series · 16 of 46 positions shown, 18 images · non-contrast
Comparison: CT 01/04/2015

CLINICAL DATA: Awoke with right flank pain.  Nausea.

EXAM:
CT ABDOMEN AND PELVIS WITHOUT CONTRAST
TECHNIQUE: Multidetector CT imaging of the abdomen and pelvis was performed
following the standard protocol without IV contrast.

[Series 2: axial st · axial · 0.98mm/px · z∈[+1085,+1495]mm · 13 of 92 slices shown, 15 images]
[im 5/92  soft-tissue]
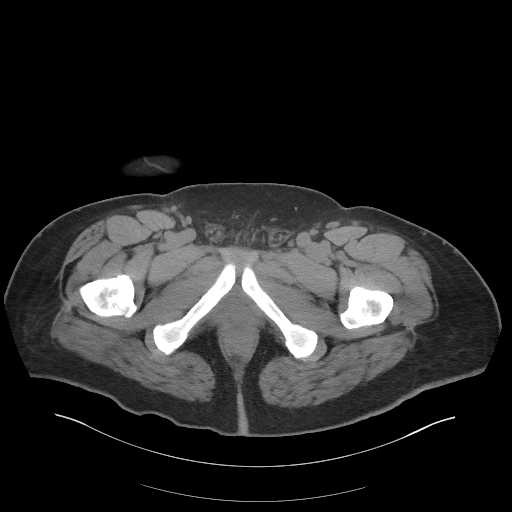
[im 5/92  bone]
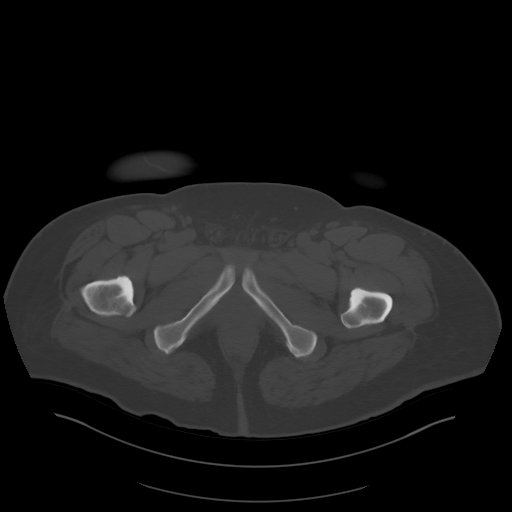
[im 13/92  soft-tissue]
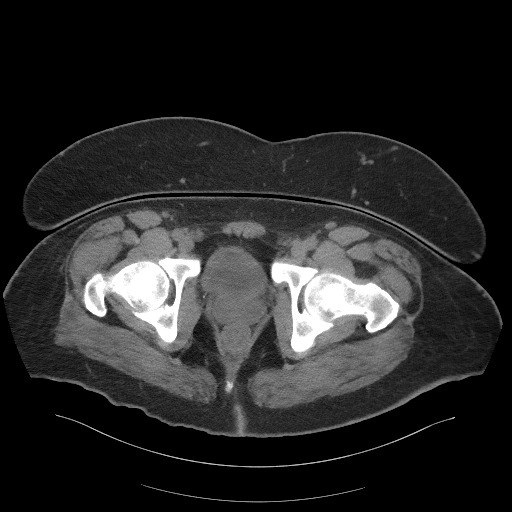
[im 21/92  soft-tissue]
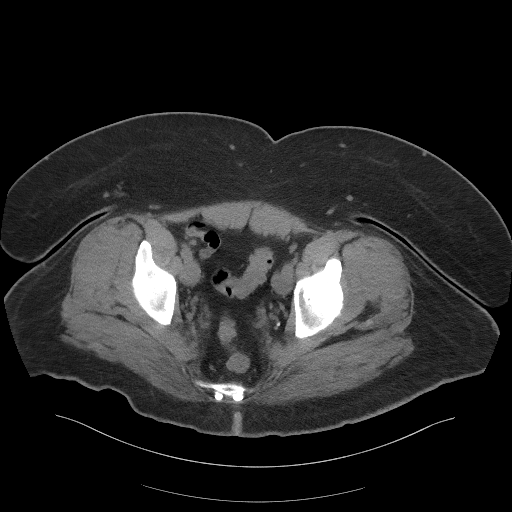
[im 25/92  soft-tissue]
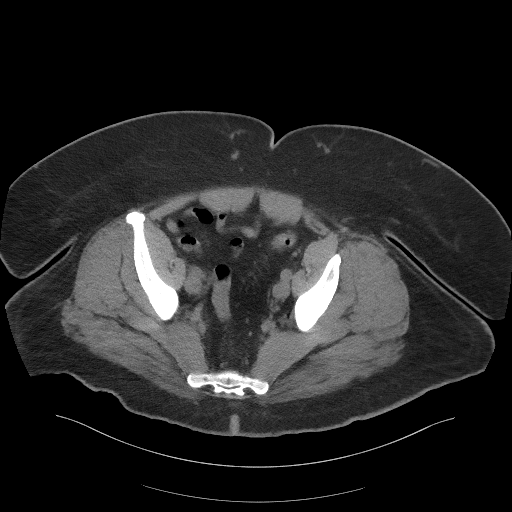
[im 34/92  soft-tissue]
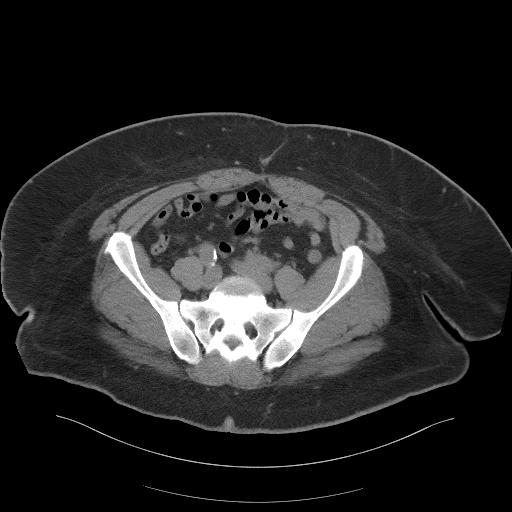
[im 38/92  soft-tissue]
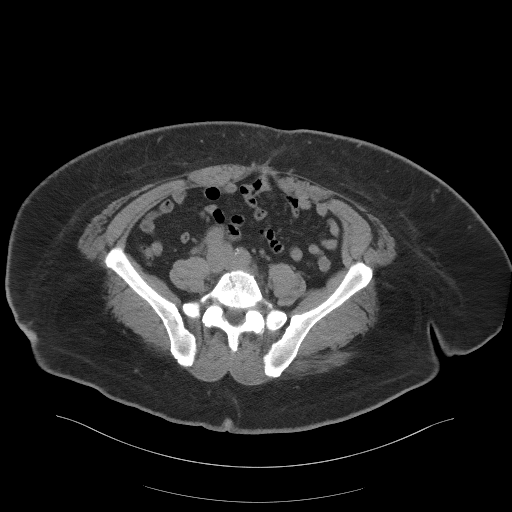
[im 46/92  soft-tissue]
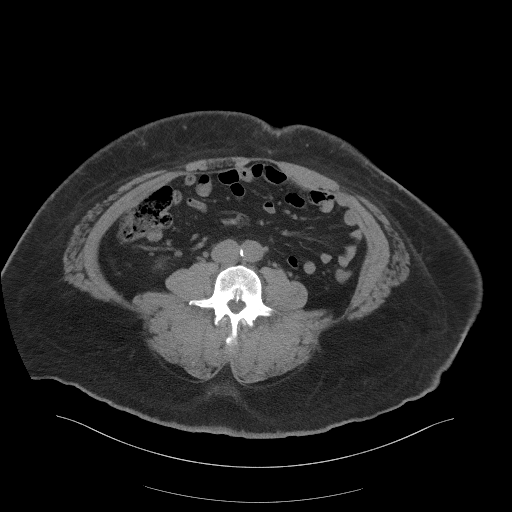
[im 54/92  soft-tissue]
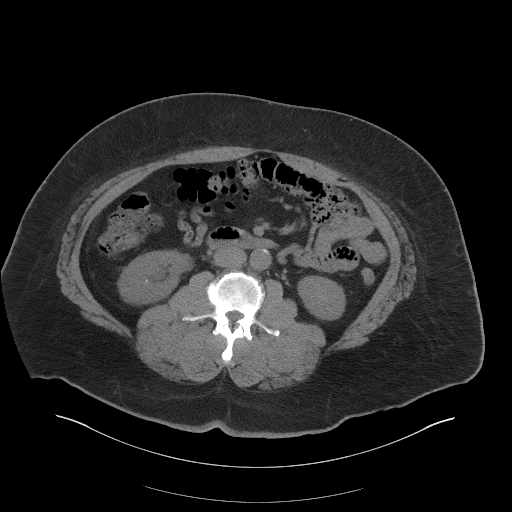
[im 58/92  soft-tissue]
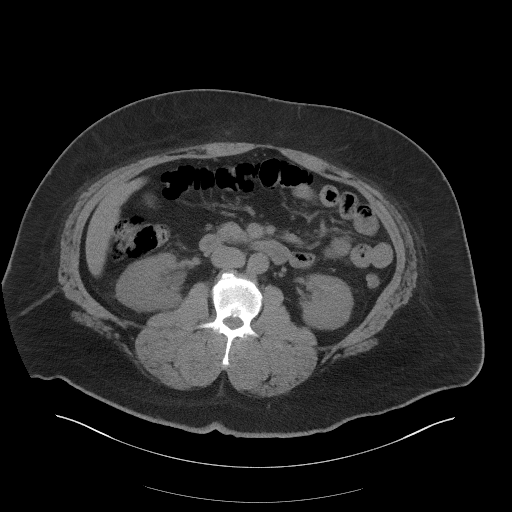
[im 58/92  bone]
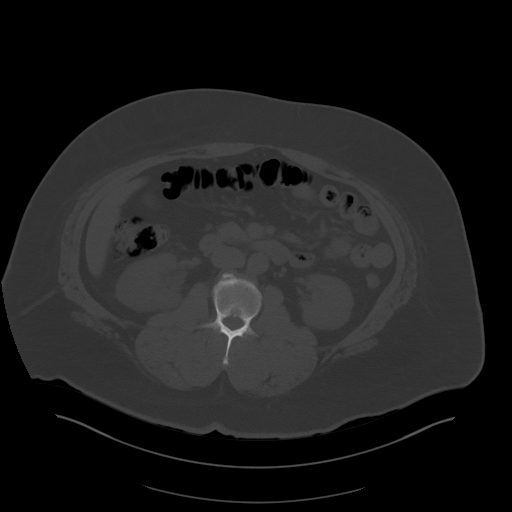
[im 67/92  soft-tissue]
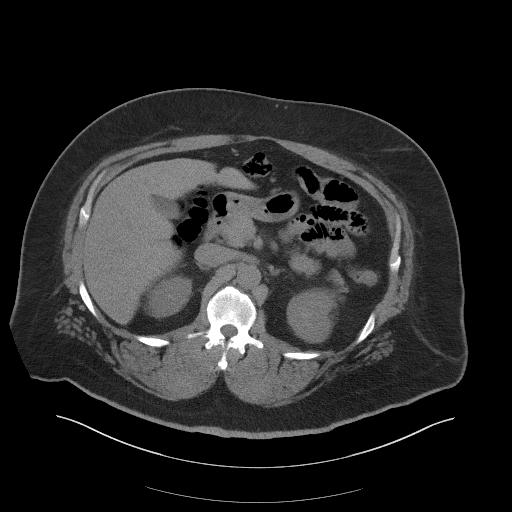
[im 71/92  soft-tissue]
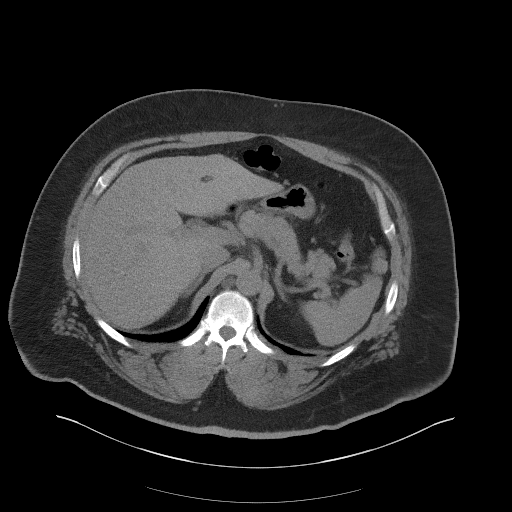
[im 79/92  soft-tissue]
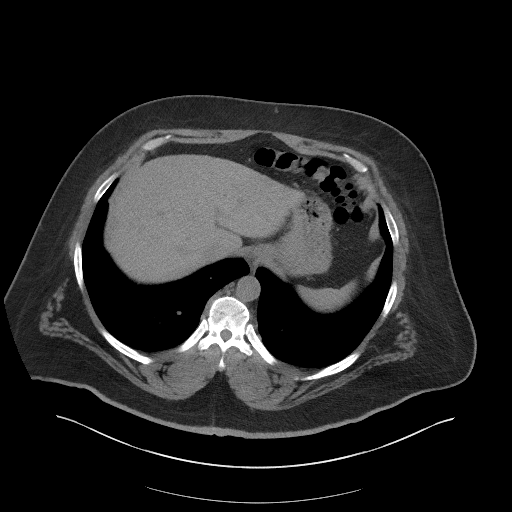
[im 87/92  soft-tissue]
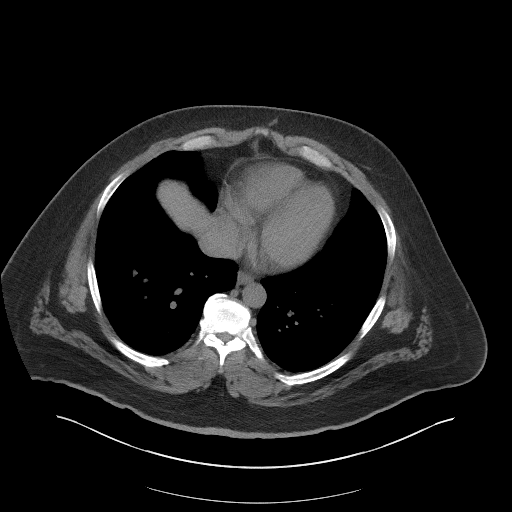

[Series 5: coronal st · coronal · 0.77mm/px · 3 of 101 slices shown]
[im 34/101  soft-tissue]
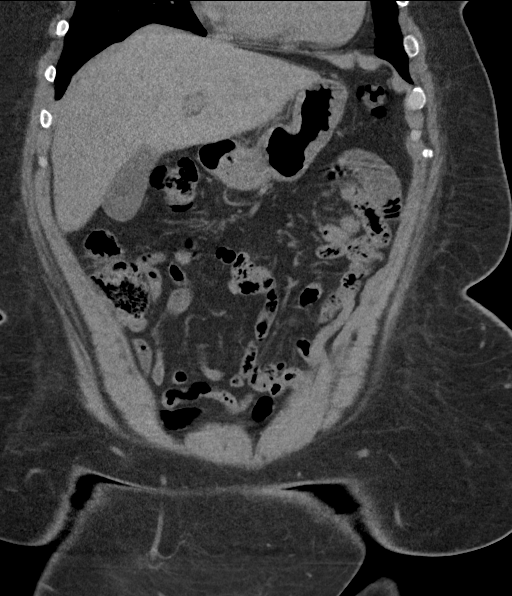
[im 45/101  soft-tissue]
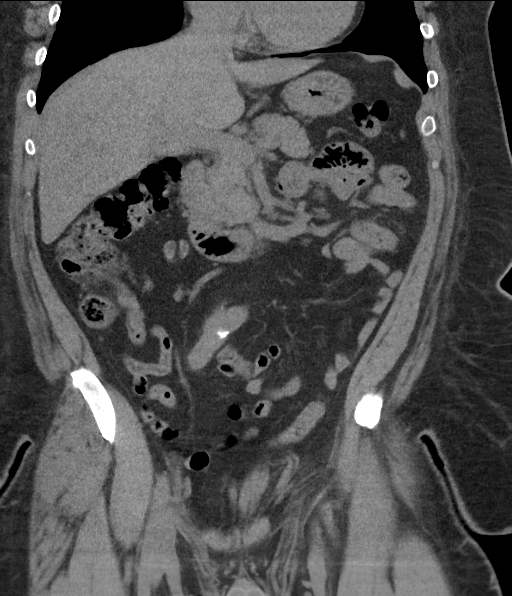
[im 56/101  soft-tissue]
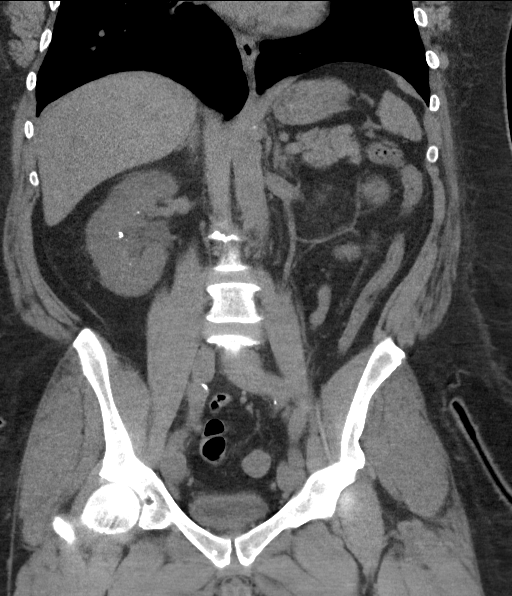

[16 of 46 positions shown; findings below may reference images not displayed]

FINDINGS: Lower chest: The lung bases are clear.

Hepatobiliary: Liver is upper normal in size spanning 18.6 cm.
Borderline hepatic steatosis. Gallbladder physiologically distended
without calcified stone, pericholecystic inflammation or biliary
dilatation.

Pancreas: No ductal dilatation or inflammation.

Spleen: Normal in size without focal abnormality.

Adrenals/Urinary Tract: Normal adrenal glands. Obstructing 4 mm
stone at the right ureterovesicular junction with moderate
hydroureteronephrosis and perinephric edema. Multiple additional
nonobstructing stones in the right kidney. Multiple nonobstructing
left renal stones without left hydronephrosis. The left ureter is
decompressed without stone along the course. Nondistended urinary
bladder.

Stomach/Bowel: Stomach is nondistended. No bowel wall thickening,
inflammatory change or obstruction. Normal appendix. Small stool
burden in the colon.

Vascular/Lymphatic: Mild aortic atherosclerosis without aneurysm. No
enlarged abdominal or pelvic lymph nodes.

Reproductive: Prostate is unremarkable.

Other: No free air, free fluid, or intra-abdominal fluid collection.

Musculoskeletal: Multilevel degenerative change throughout spine.
There are no acute or suspicious osseous abnormalities.
IMPRESSION: 1. Obstructing 4 mm stone in the right ureterovesicular junction
with moderate hydroureteronephrosis.
2. Additional nonobstructing bilateral renal calculi.

## 2020-05-21 ENCOUNTER — Other Ambulatory Visit: Payer: Self-pay

## 2020-05-21 ENCOUNTER — Encounter: Payer: Self-pay | Admitting: Nurse Practitioner

## 2020-05-21 ENCOUNTER — Ambulatory Visit: Payer: BC Managed Care – PPO | Admitting: Nurse Practitioner

## 2020-05-21 VITALS — BP 110/80 | HR 74 | Temp 98.1°F | Ht 68.0 in | Wt 298.6 lb

## 2020-05-21 DIAGNOSIS — R7303 Prediabetes: Secondary | ICD-10-CM

## 2020-05-21 DIAGNOSIS — E7849 Other hyperlipidemia: Secondary | ICD-10-CM

## 2020-05-21 DIAGNOSIS — I10 Essential (primary) hypertension: Secondary | ICD-10-CM | POA: Diagnosis not present

## 2020-05-21 DIAGNOSIS — R21 Rash and other nonspecific skin eruption: Secondary | ICD-10-CM

## 2020-05-21 DIAGNOSIS — Z6841 Body Mass Index (BMI) 40.0 and over, adult: Secondary | ICD-10-CM

## 2020-05-21 MED ORDER — MOMETASONE FUROATE 0.1 % EX CREA: TOPICAL_CREAM | CUTANEOUS | 1 refills | Status: AC

## 2020-05-21 NOTE — Progress Notes (Signed)
This visit occurred during the SARS-CoV-2 public health emergency.  Safety protocols were in place, including screening questions prior to the visit, additional usage of staff PPE, and extensive cleaning of exam room while observing appropriate contact time as indicated for disinfecting solutions.  Subjective:     Patient ID: Alex Harrison , male    DOB: 1971/01/22 , 49 y.o.   MRN: [id number]   Chief Complaint  Patient presents with  . Hypertension    HPI  Here for weight check and HTN follow up. He is walking and started back  two weeks ago. 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 He eats pork chops and [place] burgers. He does profess that he is having difficulty with sweets and has been eating large portions. He drinks about four bottles of water a day and doesn't drink soda but does drink Gatorade. He is not taking the saxenda but he used it up with no side effects.He did have COVID vaccine and experienced a fever and chills with the first and did well other than the sluggish. He has a small spots of poison ivy that is healing and would like some cream for treatment.    Wt Readings from Last 3 Encounters: 05/21/20 : 298 lb 9.6 oz (135.4 kg) 04/01/20 : (!) 305 lb (138.3 kg) 03/18/20 : (!) 305 lb 12.8 oz (138.7 kg)  Hypertension This is a chronic problem. The current episode started more than 1 year ago. The problem has been gradually improving since onset. The problem is controlled. Pertinent negatives include no anxiety, chest pain or headaches. There are no associated agents to hypertension. Risk factors for coronary artery disease include male gender and sedentary lifestyle. The current treatment provides moderate improvement. There are no compliance problems.  There is no history of kidney disease.     Past Medical History:  Diagnosis Date  . Allergic rhinitis   . Allergy    seasonal allergic rhinitis  . Diabetes mellitus without complication (HCC)   . GERD  (gastroesophageal reflux disease)    past hx   . Gout   . Hyperlipidemia   . Hypertension   . Left arm pain   . Left elbow pain   . Macular degeneration    shots every 3 months   . Morbid obesity (HCC)   . Neck nodule 09/16/10   right      Family History  Problem Relation Age of Onset  . Hypertension Mother   . Diabetes Mother   . Stroke Mother   . Hypertension Father   . Diabetes Father   . Diabetes Sister   . Hypertension Sister   . Colon cancer Neg Hx   . Colon polyps Neg Hx   . Esophageal cancer Neg Hx   . Rectal cancer Neg Hx   . Stomach cancer Neg Hx      Current Outpatient Medications:  .  allopurinol (ZYLOPRIM) 300 MG tablet, Take 1 tablet by mouth once daily, Disp: 90 tablet, Rfl: 0 .  atorvastatin (LIPITOR) 40 MG tablet, TAKE 1 TABLET BY MOUTH ONCE DAILY AT BEDTIME, Disp: 90 tablet, Rfl: 0 .  Calcium Carbonate (CALCIUM 600 PO), Take 1 tablet by mouth daily at 12 noon., Disp: , Rfl:  .  Cholecalciferol (VITAMIN D-3) 5000 UNITS TABS, Take by mouth.  , Disp: , Rfl:  .  fexofenadine (ALLEGRA) 180 MG tablet, Take 180 mg by mouth daily., Disp: , Rfl:  .  Insulin Pen Needle (BD PEN NEEDLE MICRO  U/F) 32G X 6 MM MISC, Use as directed with Saxenda pen, Disp: 100 each, Rfl: 1 .  MAGNESIUM GLUCONATE PO, Take 30 mg by mouth daily., Disp: , Rfl:  .  mometasone (NASONEX) 50 MCG/ACT nasal spray, Use 2 spray(s) in each nostril once daily, Disp: 17 g, Rfl: 0 .  Omega-3 Fatty Acids (FISH OIL PO), Take by mouth., Disp: , Rfl:  .  SAXENDA 18 MG/3ML SOPN, INJECT 3 MG  SUBCUTANEOUSLY ONCE DAILY (Patient not taking: Reported on 05/21/2020), Disp: 15 mL, Rfl: 0   Allergies  Allergen Reactions  . Niaspan [Niacin Er]     Skin burning and itching and painful to touch.     Review of Systems  Constitutional: Negative.  Negative for fatigue.  Respiratory: Negative.   Cardiovascular: Negative.  Negative for chest pain and leg swelling.  Skin: Positive for rash.       Poison Ivy on left  forearm for past week.  Neurological: Negative.  Negative for dizziness and headaches.  Hematological: Negative.   Psychiatric/Behavioral: Negative.      Today's Vitals   05/21/20 0909  BP: 110/80  Pulse: 74  Temp: 98.1 F (36.7 C)  TempSrc: Oral  Weight: 298 lb 9.6 oz (135.4 kg)  Height: 5\' 8"  (1.727 m)  PainSc: 0-No pain   Body mass index is 45.4 kg/m.   Objective:  Physical Exam Constitutional:      Appearance: Normal appearance.  Cardiovascular:     Rate and Rhythm: Normal rate and regular rhythm.     Pulses: Normal pulses.     Heart sounds: Normal heart sounds. No murmur.  Pulmonary:     Effort: Pulmonary effort is normal.     Breath sounds: Wheezing present. No rhonchi.  Skin:    General: Skin is warm and dry.     Capillary Refill: Capillary refill takes less than 2 seconds.     Findings: Erythema, lesion and rash present. No bruising.  Neurological:     General: No focal deficit present.     Mental Status: He is alert and oriented to person, place, and time.  Psychiatric:        Mood and Affect: Mood normal.        Behavior: Behavior normal.        Thought Content: Thought content normal.        Judgment: Judgment normal.         Assessment And Plan:     1. Hypertension, unspecified type  Chronic, good control  No current medications   2. Prediabetes  Chronic, good control  No current medications  3. Class 3 severe obesity due to excess calories without serious comorbidity with body mass index (BMI) of 45.0 to 49.9 in adult [facility])  He has lost 7 lbs since his last office visit, his insurance denied his Saxenda after his last visit  Encouraged to continue walking and to incorporate more strength training  4. Other hyperlipidemia  Chronic, continue with atorvastatin   5. Rash and nonspecific skin eruption  Left arm with erythematous rash   Will treat with hydrocortisone cream    Marylu Lund, RN    [name], DNP,  FNP-BC   THE PATIENT IS ENCOURAGED TO PRACTICE SOCIAL DISTANCING DUE TO THE COVID-19 PANDEMIC.

## 2020-07-16 ENCOUNTER — Other Ambulatory Visit: Payer: Self-pay | Admitting: Nurse Practitioner

## 2020-07-16 DIAGNOSIS — M109 Gout, unspecified: Secondary | ICD-10-CM

## 2020-07-16 DIAGNOSIS — E785 Hyperlipidemia, unspecified: Secondary | ICD-10-CM

## 2020-07-22 ENCOUNTER — Encounter: Payer: Self-pay | Admitting: Nurse Practitioner

## 2020-07-22 ENCOUNTER — Ambulatory Visit: Payer: BC Managed Care – PPO | Admitting: Nurse Practitioner

## 2020-07-22 ENCOUNTER — Other Ambulatory Visit: Payer: Self-pay

## 2020-07-22 VITALS — BP 124/82 | HR 79 | Temp 97.8°F | Ht 68.0 in | Wt 312.2 lb

## 2020-07-22 DIAGNOSIS — R7303 Prediabetes: Secondary | ICD-10-CM

## 2020-07-22 DIAGNOSIS — E559 Vitamin D deficiency, unspecified: Secondary | ICD-10-CM | POA: Diagnosis not present

## 2020-07-22 DIAGNOSIS — E7849 Other hyperlipidemia: Secondary | ICD-10-CM | POA: Diagnosis not present

## 2020-07-22 DIAGNOSIS — Z6841 Body Mass Index (BMI) 40.0 and over, adult: Secondary | ICD-10-CM

## 2020-07-22 DIAGNOSIS — Z1159 Encounter for screening for other viral diseases: Secondary | ICD-10-CM

## 2020-07-22 MED ORDER — WEGOVY 0.5 MG/0.5ML ~~LOC~~ SOAJ: 0.5000 mg | SUBCUTANEOUS | 0 refills | Status: DC

## 2020-07-22 NOTE — Progress Notes (Signed)
Rutherford Nail as a scribe for [name], FNP.,have documented all relevant documentation on the behalf of [name], FNP,as directed by  [name], FNP while in the presence of [name], [place]. This visit occurred during the SARS-CoV-2 public health emergency.  Safety protocols were in place, including screening questions prior to the visit, additional usage of staff PPE, and extensive cleaning of exam room while observing appropriate contact time as indicated for disinfecting solutions.  Subjective:     Patient ID: Alex Harrison , male    DOB: [birth year] , 49 y.o.   MRN: [id number]   Chief Complaint  Patient presents with  . Weight Check    HPI  Here for weight check - he is no longer on the Saxenda due to not covered by insurance. He is not walking over the last 3 weeks due to moving.  Diet is not good still can not let his sweets.    Wt Readings from Last 3 Encounters: 07/22/20 : (!) 312 lb 3.2 oz (141.6 kg) 05/21/20 : 298 lb 9.6 oz (135.4 kg) 04/01/20 : (!) 305 lb (138.3 kg)      Past Medical History:  Diagnosis Date  . Allergic rhinitis   . Allergy    seasonal allergic rhinitis  . Diabetes mellitus without complication ([place])   . GERD (gastroesophageal reflux disease)    past hx   . Gout   . Hyperlipidemia   . Hypertension   . Left arm pain   . Left elbow pain   . Macular degeneration    shots every 3 months   . Morbid obesity ([place])   . Neck nodule 09/16/10   right      Family History  Problem Relation Age of Onset  . Hypertension Mother   . Diabetes Mother   . Stroke Mother   . Hypertension Father   . Diabetes Father   . Diabetes Sister   . Hypertension Sister   . Colon cancer Neg Hx   . Colon polyps Neg Hx   . Esophageal cancer Neg Hx   . Rectal cancer Neg Hx   . Stomach cancer Neg Hx      Current Outpatient Medications:  .  allopurinol (ZYLOPRIM) 300 MG tablet, Take 1 tablet by mouth once daily, Disp: 90 tablet, Rfl: 0 .   atorvastatin (LIPITOR) 40 MG tablet, TAKE 1 TABLET BY MOUTH ONCE DAILY AT BEDTIME, Disp: 90 tablet, Rfl: 0 .  Calcium Carbonate (CALCIUM 600 PO), Take 1 tablet by mouth daily at 12 noon., Disp: , Rfl:  .  Cholecalciferol (VITAMIN D-3) 5000 UNITS TABS, Take by mouth.  , Disp: , Rfl:  .  fexofenadine (ALLEGRA) 180 MG tablet, Take 180 mg by mouth daily., Disp: , Rfl:  .  MAGNESIUM GLUCONATE PO, Take 30 mg by mouth daily., Disp: , Rfl:  .  mometasone (ELOCON) 0.1 % cream, Apply to affected area daily, Disp: 45 g, Rfl: 1 .  mometasone (NASONEX) 50 MCG/ACT nasal spray, Use 2 spray(s) in each nostril once daily, Disp: 17 g, Rfl: 0 .  Omega-3 Fatty Acids (FISH OIL PO), Take by mouth., Disp: , Rfl:  .  Insulin Pen Needle (BD PEN NEEDLE MICRO U/F) 32G X 6 MM MISC, Use as directed with Saxenda pen (Patient not taking: Reported on 07/22/2020), Disp: 100 each, Rfl: 1 .  Semaglutide-Weight Management (WEGOVY) 0.5 MG/0.5ML SOAJ, Inject 0.5 mg into the skin once a week., Disp: 2 mL, Rfl: 0   Allergies  Allergen Reactions  . Niaspan [Niacin Er]     Skin burning and itching and painful to touch.     Review of Systems  Constitutional: Negative.   Respiratory: Negative.  Negative for cough.   Cardiovascular: Negative.   Neurological: Negative.  Negative for dizziness and headaches.  Hematological: Negative.   Psychiatric/Behavioral: Negative.      Today's Vitals   07/22/20 0954  BP: 124/82  Pulse: 79  Temp: 97.8 F (36.6 C)  TempSrc: Oral  Weight: (!) 312 lb 3.2 oz (141.6 kg)  Height: 5' 8"  (1.727 m)  PainSc: 0-No pain   Body mass index is 47.47 kg/m.   Objective:  Physical Exam Constitutional:      Appearance: Normal appearance.  Cardiovascular:     Rate and Rhythm: Normal rate and regular rhythm.     Pulses: Normal pulses.     Heart sounds: Normal heart sounds. No murmur heard.   Pulmonary:     Effort: Pulmonary effort is normal. No respiratory distress.     Breath sounds: Normal  breath sounds. No wheezing.  Skin:    Capillary Refill: Capillary refill takes less than 2 seconds.  Neurological:     General: No focal deficit present.     Mental Status: He is alert and oriented to person, place, and time.     Cranial Nerves: No cranial nerve deficit.  Psychiatric:        Mood and Affect: Mood normal.        Behavior: Behavior normal.        Thought Content: Thought content normal.        Judgment: Judgment normal.         Assessment And Plan:     1. Class 3 severe obesity due to excess calories without serious comorbidity with body mass index (BMI) of 45.0 to 49.9 in adult [facility]) He has gained approximately 13 lbs since his last visit We will start Wegovy to see if this may help, he did well with Saxenda until insurance would not cover any longer.   2. Prediabetes  Chronic, good control  No current medications  Encouraged to get back to walking for exercise regularly - CMP14+EGFR  3. Other hyperlipidemia  Chronic, stable  Continue with current medications - CMP14+EGFR - Lipid panel  4. Vitamin D deficiency  Will check vitamin D level and supplement as needed.     Also encouraged to spend 15 minutes in the sun daily.  - VITAMIN D 25 Hydroxy (Vit-D Deficiency, Fractures)  5. Encounter for hepatitis C screening test for low risk patient  Will check Hepatitis C screening due to recent recommendations to screen all adults 18 years and older - Hepatitis C antibody     Patient was given opportunity to ask questions. Patient verbalized understanding of the plan and was able to repeat key elements of the plan. All questions were answered to their satisfaction.  [name], FNP   I, [name], FNP, have reviewed all documentation for this visit. The documentation on 07/23/20 for the exam, diagnosis, procedures, and orders are all accurate and complete.  THE PATIENT IS ENCOURAGED TO PRACTICE SOCIAL DISTANCING DUE TO THE COVID-19 PANDEMIC.

## 2020-07-22 NOTE — Patient Instructions (Signed)
   If you have any stomach pain or difficulty swallowing call to office  [facility] may cause nausea allow time for this to improve  Goal to lose 1-2 lbs per week  Increase your physical activity and incorporate 2 days of strength training.

## 2020-07-23 LAB — CMP14+EGFR
ALT: 50 IU/L — ABNORMAL HIGH (ref 0–44)
AST: 36 IU/L (ref 0–40)
Albumin/Globulin Ratio: 1.7 (ref 1.2–2.2)
Albumin: 4.3 g/dL (ref 4.0–5.0)
Alkaline Phosphatase: 91 IU/L (ref 48–121)
BUN/Creatinine Ratio: 12 (ref 9–20)
BUN: 12 mg/dL (ref 6–24)
Bilirubin Total: 0.7 mg/dL (ref 0.0–1.2)
CO2: 25 mmol/L (ref 20–29)
Calcium: 9.5 mg/dL (ref 8.7–10.2)
Chloride: 105 mmol/L (ref 96–106)
Creatinine, Ser: 1.03 mg/dL (ref 0.76–1.27)
GFR calc Af Amer: 97 mL/min/{1.73_m2} (ref >59)
GFR calc non Af Amer: 84 mL/min/{1.73_m2} (ref >59)
Globulin, Total: 2.5 g/dL (ref 1.5–4.5)
Glucose: 86 mg/dL (ref 65–99)
Potassium: 4.5 mmol/L (ref 3.5–5.2)
Sodium: 143 mmol/L (ref 134–144)
Total Protein: 6.8 g/dL (ref 6.0–8.5)

## 2020-07-23 LAB — VITAMIN D 25 HYDROXY (VIT D DEFICIENCY, FRACTURES): Vit D, 25-Hydroxy: 56.8 ng/mL (ref 30.0–100.0)

## 2020-07-23 LAB — LIPID PANEL
Chol/HDL Ratio: 3 ratio (ref 0.0–5.0)
Cholesterol, Total: 140 mg/dL (ref 100–199)
HDL: 46 mg/dL (ref >39)
LDL Chol Calc (NIH): 80 mg/dL (ref 0–99)
Triglycerides: 68 mg/dL (ref 0–149)
VLDL Cholesterol Cal: 14 mg/dL (ref 5–40)

## 2020-07-23 LAB — HEPATITIS C ANTIBODY: Hep C Virus Ab: <0.1 s/co ratio (ref 0.0–0.9)

## 2020-08-13 LAB — HM DIABETES EYE EXAM

## 2020-08-14 ENCOUNTER — Encounter: Payer: Self-pay | Admitting: Nurse Practitioner

## 2020-08-20 ENCOUNTER — Encounter: Payer: Self-pay | Admitting: Nurse Practitioner

## 2020-08-20 ENCOUNTER — Ambulatory Visit: Payer: BC Managed Care – PPO | Admitting: Nurse Practitioner

## 2020-08-20 ENCOUNTER — Other Ambulatory Visit: Payer: Self-pay

## 2020-08-20 VITALS — BP 118/74 | HR 96 | Temp 97.8°F | Ht 68.0 in | Wt 310.0 lb

## 2020-08-20 DIAGNOSIS — Z6841 Body Mass Index (BMI) 40.0 and over, adult: Secondary | ICD-10-CM

## 2020-08-20 DIAGNOSIS — Z23 Encounter for immunization: Secondary | ICD-10-CM | POA: Diagnosis not present

## 2020-08-20 NOTE — Progress Notes (Signed)
Tomasa Hose as a scribe for [name], FNP.,have documented all relevant documentation on the behalf of [name], FNP,as directed by  [name], FNP while in the presence of [name], FNP.   This visit occurred during the SARS-CoV-2 public health emergency.  Safety protocols were in place, including screening questions prior to the visit, additional usage of staff PPE, and extensive cleaning of exam room while observing appropriate contact time as indicated for disinfecting solutions.  Subjective:     Patient ID: Alex Harrison , male    DOB: 1971-09-02 , 49 y.o.   MRN: [id number]   Chief Complaint  Patient presents with  . Weight Check    Wegovy     HPI  Here for weight check he is behind on his Wegovy injection because the pharmacy is out of stock. Took last injection last Tuesday.  He reports they are expecting a shipment on the 10th of this month.  He has not been eating an increased amount of sweets.  Denies constipation or nausea.  Occasionally will eat chips.  He is drinking about 6-7 bottles a day of water. No soda but will have tea every once in a while. He is not walking regularly states "I am going to start back walking"   Wt Readings from Last 3 Encounters: 08/20/20 : (!) 310 lb (140.6 kg) 07/22/20 : (!) 312 lb 3.2 oz (141.6 kg) 05/21/20 : 298 lb 9.6 oz (135.4 kg)     Past Medical History:  Diagnosis Date  . Allergic rhinitis   . Allergy    seasonal allergic rhinitis  . Diabetes mellitus without complication (HCC)   . GERD (gastroesophageal reflux disease)    past hx   . Gout   . Hyperlipidemia   . Hypertension   . Left arm pain   . Left elbow pain   . Macular degeneration    shots every 3 months   . Morbid obesity (HCC)   . Neck nodule 09/16/10   right      Family History  Problem Relation Age of Onset  . Hypertension Mother   . Diabetes Mother   . Stroke Mother   . Hypertension Father   . Diabetes Father   . Diabetes Sister    . Hypertension Sister   . Colon cancer Neg Hx   . Colon polyps Neg Hx   . Esophageal cancer Neg Hx   . Rectal cancer Neg Hx   . Stomach cancer Neg Hx      Current Outpatient Medications:  .  allopurinol (ZYLOPRIM) 300 MG tablet, Take 1 tablet by mouth once daily, Disp: 90 tablet, Rfl: 0 .  atorvastatin (LIPITOR) 40 MG tablet, TAKE 1 TABLET BY MOUTH ONCE DAILY AT BEDTIME, Disp: 90 tablet, Rfl: 0 .  Calcium Carbonate (CALCIUM 600 PO), Take 1 tablet by mouth daily at 12 noon., Disp: , Rfl:  .  Cholecalciferol (VITAMIN D-3) 5000 UNITS TABS, Take by mouth.  , Disp: , Rfl:  .  fexofenadine (ALLEGRA) 180 MG tablet, Take 180 mg by mouth daily., Disp: , Rfl:  .  MAGNESIUM GLUCONATE PO, Take 30 mg by mouth daily., Disp: , Rfl:  .  mometasone (ELOCON) 0.1 % cream, Apply to affected area daily, Disp: 45 g, Rfl: 1 .  mometasone (NASONEX) 50 MCG/ACT nasal spray, Use 2 spray(s) in each nostril once daily, Disp: 17 g, Rfl: 0 .  Omega-3 Fatty Acids (FISH OIL PO), Take by mouth., Disp: , Rfl:  .  Semaglutide-Weight Management (WEGOVY) 0.5 MG/0.5ML SOAJ, Inject 0.5 mg into the skin once a week., Disp: 2 mL, Rfl: 0 .  Insulin Pen Needle (BD PEN NEEDLE MICRO U/F) 32G X 6 MM MISC, Use as directed with Saxenda pen (Patient not taking: Reported on 07/22/2020), Disp: 100 each, Rfl: 1   Allergies  Allergen Reactions  . Niaspan [Niacin Er]     Skin burning and itching and painful to touch.     Review of Systems  Constitutional: Negative.   HENT: Negative.   Respiratory: Negative.   Musculoskeletal: Negative.   Skin: Negative.   Neurological: Negative.   Psychiatric/Behavioral: Negative.      Today's Vitals   08/20/20 1022  BP: 118/74  Pulse: 96  Temp: 97.8 F (36.6 C)  TempSrc: Oral  Weight: (!) 310 lb (140.6 kg)  Height: 5\' 8"  (1.727 m)  PainSc: 0-No pain   Body mass index is 47.14 kg/m.   Objective:  Physical Exam Constitutional:      General: He is not in acute distress.     Appearance: Normal appearance. He is obese.  Cardiovascular:     Rate and Rhythm: Normal rate and regular rhythm.     Pulses: Normal pulses.     Heart sounds: Normal heart sounds. No murmur heard.   Pulmonary:     Effort: Pulmonary effort is normal. No respiratory distress.     Breath sounds: Normal breath sounds. No wheezing.  Skin:    Capillary Refill: Capillary refill takes less than 2 seconds.  Neurological:     General: No focal deficit present.     Mental Status: He is alert and oriented to person, place, and time.     Cranial Nerves: No cranial nerve deficit.  Psychiatric:        Mood and Affect: Mood normal.        Behavior: Behavior normal.        Thought Content: Thought content normal.        Judgment: Judgment normal.         Assessment And Plan:     1. Class 3 severe obesity due to excess calories without serious comorbidity with body mass index (BMI) of 45.0 to 49.9 in adult [facility] [place])  Chronic, he had a 2 lb weight loss.  Reviewed his labs during office visit    ASK- Given permission to discuss weight today ASSESS - Body mass index is 47.14 kg/m., well tolerated, WAIST CIRCUMFERENCE 56 inches ADVISE  - on risk of cardiovascular disease currently has a diagnosis of hypertension, on medications. AGREE - to increase her physical activity of walking 15 minutes a day 5 days a week by next office visit in one month.   ASSIST - will refill his Wegovy   Patient was given opportunity to ask questions. Patient verbalized understanding of the plan and was able to repeat key elements of the plan. All questions were answered to their satisfaction.   [facility], FNP, have reviewed all documentation for this visit. The documentation on 08/20/20 for the exam, diagnosis, procedures, and orders are all accurate and complete.   THE PATIENT IS ENCOURAGED TO PRACTICE SOCIAL DISTANCING DUE TO THE COVID-19 PANDEMIC.

## 2020-08-20 NOTE — Patient Instructions (Signed)
Encouraged to increase her physical activity of walking 15 minutes a day 5 days a week by next office visit in one month.

## 2020-08-22 ENCOUNTER — Encounter: Payer: Self-pay | Admitting: Nurse Practitioner

## 2020-08-25 ENCOUNTER — Other Ambulatory Visit: Payer: Self-pay

## 2020-08-25 DIAGNOSIS — Z6841 Body Mass Index (BMI) 40.0 and over, adult: Secondary | ICD-10-CM

## 2020-08-25 MED ORDER — WEGOVY 0.5 MG/0.5ML ~~LOC~~ SOAJ: 0.5000 mg | SUBCUTANEOUS | 0 refills | Status: DC

## 2020-08-25 MED FILL — WEGOVY 0.5 MG/0.5ML SOAJ: 0.5 | 28 days supply | Qty: 2 | Fill #0

## 2020-08-27 ENCOUNTER — Ambulatory Visit: Payer: BC Managed Care – PPO | Admitting: Nurse Practitioner

## 2020-09-29 ENCOUNTER — Encounter: Payer: Self-pay | Admitting: Nurse Practitioner

## 2020-09-29 ENCOUNTER — Other Ambulatory Visit: Payer: Self-pay | Admitting: Nurse Practitioner

## 2020-09-29 ENCOUNTER — Ambulatory Visit: Payer: BC Managed Care – PPO | Admitting: Nurse Practitioner

## 2020-09-29 ENCOUNTER — Other Ambulatory Visit: Payer: Self-pay

## 2020-09-29 VITALS — BP 122/74 | HR 73 | Temp 97.9°F | Ht 68.0 in | Wt 311.0 lb

## 2020-09-29 DIAGNOSIS — E7849 Other hyperlipidemia: Secondary | ICD-10-CM | POA: Diagnosis not present

## 2020-09-29 DIAGNOSIS — Z0000 Encounter for general adult medical examination without abnormal findings: Secondary | ICD-10-CM | POA: Diagnosis not present

## 2020-09-29 DIAGNOSIS — L84 Corns and callosities: Secondary | ICD-10-CM

## 2020-09-29 DIAGNOSIS — I10 Essential (primary) hypertension: Secondary | ICD-10-CM | POA: Diagnosis not present

## 2020-09-29 DIAGNOSIS — Z125 Encounter for screening for malignant neoplasm of prostate: Secondary | ICD-10-CM | POA: Diagnosis not present

## 2020-09-29 DIAGNOSIS — E785 Hyperlipidemia, unspecified: Secondary | ICD-10-CM

## 2020-09-29 DIAGNOSIS — Z6841 Body Mass Index (BMI) 40.0 and over, adult: Secondary | ICD-10-CM

## 2020-09-29 DIAGNOSIS — M79672 Pain in left foot: Secondary | ICD-10-CM

## 2020-09-29 DIAGNOSIS — M109 Gout, unspecified: Secondary | ICD-10-CM

## 2020-09-29 DIAGNOSIS — R7303 Prediabetes: Secondary | ICD-10-CM

## 2020-09-29 DIAGNOSIS — E559 Vitamin D deficiency, unspecified: Secondary | ICD-10-CM

## 2020-09-29 DIAGNOSIS — E66813 Obesity, class 3: Secondary | ICD-10-CM

## 2020-09-29 LAB — POCT URINALYSIS DIPSTICK
Bilirubin, UA: NEGATIVE
Blood, UA: NEGATIVE
Glucose, UA: NEGATIVE
Ketones, UA: NEGATIVE
Leukocytes, UA: NEGATIVE
Nitrite, UA: NEGATIVE
Protein, UA: NEGATIVE
Spec Grav, UA: 1.025 (ref 1.010–1.025)
Urobilinogen, UA: 0.2 E.U./dL
pH, UA: 6.5 (ref 5.0–8.0)

## 2020-09-29 LAB — POCT UA - MICROALBUMIN
Albumin/Creatinine Ratio, Urine, POC: <30
Creatinine, POC: 100 mg/dL
Microalbumin Ur, POC: 10 mg/L

## 2020-09-29 MED ORDER — ATORVASTATIN CALCIUM 40 MG PO TABS: 40.0000 mg | ORAL_TABLET | Freq: Every day | ORAL | 1 refills | Status: DC

## 2020-09-29 MED ORDER — ALLOPURINOL 300 MG PO TABS: 300.0000 mg | ORAL_TABLET | Freq: Every day | ORAL | 1 refills | Status: DC

## 2020-09-29 MED ORDER — WEGOVY 1 MG/0.5ML ~~LOC~~ SOAJ: 1.0000 mg | SUBCUTANEOUS | 0 refills | Status: DC

## 2020-09-29 MED FILL — WEGOVY 1 MG/0.5ML SOAJ: 1 | 28 days supply | Qty: 2 | Fill #0

## 2020-09-29 NOTE — Progress Notes (Signed)
I,Yamilka Roman [employer] as a Education administrator for [employer], FNP.,have documented all relevant documentation on the behalf of [name], FNP,as directed by  [name], FNP while in the presence of [name], [place]. This visit occurred during the SARS-CoV-2 public health emergency.  Safety protocols were in place, including screening questions prior to the visit, additional usage of staff PPE, and extensive cleaning of exam room while observing appropriate contact time as indicated for disinfecting solutions.  Subjective:     Patient ID: Alex Harrison , male    DOB: 06/18/1971 , 49 y.o.   MRN: [id number]   Chief Complaint  Patient presents with  . Annual Exam    HPI  Here for HM  Wt Readings from Last 3 Encounters: 09/29/20 : (!) 311 lb (141.1 kg) 08/20/20 : (!) 310 lb (140.6 kg) 07/22/20 : (!) 312 lb 3.2 oz (141.6 kg)     Past Medical History:  Diagnosis Date  . Allergic rhinitis   . Allergy    seasonal allergic rhinitis  . Diabetes mellitus without complication ([place])   . GERD (gastroesophageal reflux disease)    past hx   . Gout   . Hyperlipidemia   . Hypertension   . Left arm pain   . Left elbow pain   . Macular degeneration    shots every 3 months   . Morbid obesity ([place])   . Neck nodule 09/16/10   right      Family History  Problem Relation Age of Onset  . Hypertension Mother   . Diabetes Mother   . Stroke Mother   . Hypertension Father   . Diabetes Father   . Diabetes Sister   . Hypertension Sister   . Colon cancer Neg Hx   . Colon polyps Neg Hx   . Esophageal cancer Neg Hx   . Rectal cancer Neg Hx   . Stomach cancer Neg Hx      Current Outpatient Medications:  .  allopurinol (ZYLOPRIM) 300 MG tablet, Take 1 tablet (300 mg total) by mouth daily., Disp: 90 tablet, Rfl: 1 .  atorvastatin (LIPITOR) 40 MG tablet, Take 1 tablet (40 mg total) by mouth at bedtime., Disp: 90 tablet, Rfl: 1 .  Calcium Carbonate (CALCIUM 600 PO), Take 1 tablet by mouth  daily at 12 noon., Disp: , Rfl:  .  Cholecalciferol (VITAMIN D-3) 5000 UNITS TABS, Take by mouth.  , Disp: , Rfl:  .  fexofenadine (ALLEGRA) 180 MG tablet, Take 180 mg by mouth daily., Disp: , Rfl:  .  MAGNESIUM GLUCONATE PO, Take 30 mg by mouth daily., Disp: , Rfl:  .  mometasone (ELOCON) 0.1 % cream, Apply to affected area daily, Disp: 45 g, Rfl: 1 .  mometasone (NASONEX) 50 MCG/ACT nasal spray, Use 2 spray(s) in each nostril once daily, Disp: 17 g, Rfl: 0 .  Omega-3 Fatty Acids (FISH OIL PO), Take by mouth., Disp: , Rfl:  .  Semaglutide-Weight Management (WEGOVY) 1 MG/0.5ML SOAJ, Inject 1 mg into the skin once a week., Disp: 2 mL, Rfl: 0   Allergies  Allergen Reactions  . Niaspan [Niacin Er]     Skin burning and itching and painful to touch.     Men's preventive visit. Patient Health Questionnaire (PHQ-2) is    Office Visit from 11/19/2019 in [facility]  PHQ-2 Total Score 0      Patient is on a regular diet.  Not walking as much. Not eating as many sweets.  Marital  status: Married. Relevant history for alcohol use is:  Social History   Substance and Sexual Activity  Alcohol Use No   Relevant history for tobacco use is:  Social History   Tobacco Use  Smoking Status Never Smoker  Smokeless Tobacco Never Used  .   Review of Systems  Constitutional: Negative.   HENT: Negative.   Eyes: Negative.   Respiratory: Negative.   Cardiovascular: Negative.   Gastrointestinal: Negative.   Endocrine: Negative.   Genitourinary: Negative.   Musculoskeletal: Negative.        Occasional left foot pain  Skin: Negative.   Neurological: Negative.   Hematological: Negative.   Psychiatric/Behavioral: Negative.      Today's Vitals   09/29/20 0905  BP: 122/74  Pulse: 73  Temp: 97.9 F (36.6 C)  TempSrc: Oral  Weight: (!) 311 lb (141.1 kg)  Height: 5' 8"  (1.727 m)  PainSc: 0-No pain   Body mass index is 47.29 kg/m.   Objective:  Physical Exam Vitals  reviewed.  Constitutional:      General: He is not in acute distress.    Appearance: Normal appearance. He is obese.  HENT:     Head: Normocephalic and atraumatic.     Right Ear: Tympanic membrane, ear canal and external ear normal. There is no impacted cerumen.     Left Ear: Tympanic membrane, ear canal and external ear normal. There is no impacted cerumen.     Nose:     Comments: Deferred - masked    Mouth/Throat:     Comments: Deferred - masked Eyes:     Extraocular Movements: Extraocular movements intact.     Conjunctiva/sclera: Conjunctivae normal.     Pupils: Pupils are equal, round, and reactive to light.  Cardiovascular:     Rate and Rhythm: Normal rate and regular rhythm.     Pulses: Normal pulses.     Heart sounds: Normal heart sounds. No murmur heard.   Pulmonary:     Effort: Pulmonary effort is normal. No respiratory distress.     Breath sounds: Normal breath sounds.  Abdominal:     General: Abdomen is flat. Bowel sounds are normal. There is no distension.     Palpations: Abdomen is soft.  Musculoskeletal:        General: No swelling or tenderness. Normal range of motion.     Cervical back: Normal range of motion and neck supple.  Skin:    General: Skin is warm.     Capillary Refill: Capillary refill takes less than 2 seconds.     Comments: Left foot flat to insole and has thickened skin to area  Neurological:     General: No focal deficit present.     Mental Status: He is alert and oriented to person, place, and time.  Psychiatric:        Mood and Affect: Mood normal.        Behavior: Behavior normal.        Thought Content: Thought content normal.        Judgment: Judgment normal.         Assessment And Plan:    1. Encounter for general adult medical examination w/o abnormal findings . Behavior modifications discussed and diet history reviewed.   . Pt will continue to exercise regularly and modify diet with low GI, plant based foods and decrease intake  of processed foods.  . Recommend intake of daily multivitamin, Vitamin D, and calcium.  . Recommend  for preventive screenings,  as well as recommend immunizations that include influenza, TDAP - CBC no Diff  2. Encounter for prostate cancer screening - PSA  3. Class 3 severe obesity due to excess calories without serious comorbidity with body mass index (BMI) of 45.0 to 49.9 in adult [facility])  Chronic, currently at a stand still will be increasing his Wegovy  Discussed healthy diet and regular exercise options   Encouraged to exercise at least 150 minutes per week with 2 days of strength training - Semaglutide-Weight Management (WEGOVY) 1 MG/0.5ML SOAJ; Inject 1 mg into the skin once a week.  Dispense: 2 mL; Refill: 0  4. Prediabetes  Chronic, controlled  no current medications  Encouraged to limit intake of sugary foods and drinks  Encouraged to increase physical activity to 150 minutes per week - Hemoglobin A1c  5. Vitamin D deficiency  Will check vitamin D level and supplement as needed.     Also encouraged to spend 15 minutes in the sun daily.   6. Other hyperlipidemia  Chronic, controlled  Continue with current medications, tolerating medications well - CMP14+EGFR - Lipid panel - atorvastatin (LIPITOR) 40 MG tablet; Take 1 tablet (40 mg total) by mouth at bedtime.  Dispense: 90 tablet; Refill: 1  7. Hypertension, unspecified type . B/P is well controlled.  . CMP ordered to check renal function.  . The importance of regular exercise and dietary modification was stressed to the patient.  . Stressed importance of losing ten percent of her body weight to help with B/P control.   . EKG done with sinus rhythm with right bundle branch block, no changes with Hr 67 - POCT Urinalysis Dipstick (81002) - POCT UA - Microalbumin - EKG 12-Lead - CMP14+EGFR  8. Gout of left foot, unspecified cause, unspecified chronicity  No recent flares  Continue with medication -  allopurinol (ZYLOPRIM) 300 MG tablet; Take 1 tablet (300 mg total) by mouth daily.  Dispense: 90 tablet; Refill: 1  10. Left foot pain  Pain to the insole of his left foot  He does appear to have flat feet  Will refer to podiatry for further evaluation  11. Callus of foot  Thickened skin to insole of left foot     Patient was given opportunity to ask questions. Patient verbalized understanding of the plan and was able to repeat key elements of the plan. All questions were answered to their satisfaction.    Teola Bradley, FNP, have reviewed all documentation for this visit. The documentation on 10/03/20 for the exam, diagnosis, procedures, and orders are all accurate and complete.  THE PATIENT IS ENCOURAGED TO PRACTICE SOCIAL DISTANCING DUE TO THE COVID-19 PANDEMIC.

## 2020-09-29 NOTE — Patient Instructions (Signed)
Health Maintenance, Male Adopting a healthy lifestyle and getting preventive care are important in promoting health and wellness. Ask your health care provider about:  The right schedule for you to have regular tests and exams.  Things you can do on your own to prevent diseases and keep yourself healthy. What should I know about diet, weight, and exercise? Eat a healthy diet   Eat a diet that includes plenty of vegetables, fruits, low-fat dairy products, and lean protein.  Do not eat a lot of foods that are high in solid fats, added sugars, or sodium. Maintain a healthy weight Body mass index (BMI) is a measurement that can be used to identify possible weight problems. It estimates body fat based on height and weight. Your health care provider can help determine your BMI and help you achieve or maintain a healthy weight. Get regular exercise Get regular exercise. This is one of the most important things you can do for your health. Most adults should:  Exercise for at least 150 minutes each week. The exercise should increase your heart rate and make you sweat (moderate-intensity exercise).  Do strengthening exercises at least twice a week. This is in addition to the moderate-intensity exercise.  Spend less time sitting. Even light physical activity can be beneficial. Watch cholesterol and blood lipids Have your blood tested for lipids and cholesterol at 51 years of age, then have this test every 5 years. You may need to have your cholesterol levels checked more often if:  Your lipid or cholesterol levels are high.  You are older than 51 years of age.  You are at high risk for heart disease. What should I know about cancer screening? Many types of cancers can be detected early and may often be prevented. Depending on your health history and family history, you may need to have cancer screening at various ages. This may include screening for:  Colorectal cancer.  Prostate  cancer.  Skin cancer.  Lung cancer. What should I know about heart disease, diabetes, and high blood pressure? Blood pressure and heart disease  High blood pressure causes heart disease and increases the risk of stroke. This is more likely to develop in people who have high blood pressure readings, are of African descent, or are overweight.  Talk with your health care provider about your target blood pressure readings.  Have your blood pressure checked: ? Every 3-5 years if you are 18-39 years of age. ? Every year if you are 40 years old or older.  If you are between the ages of 65 and 75 and are a current or former smoker, ask your health care provider if you should have a one-time screening for abdominal aortic aneurysm (AAA). Diabetes Have regular diabetes screenings. This checks your fasting blood sugar level. Have the screening done:  Once every three years after age 45 if you are at a normal weight and have a low risk for diabetes.  More often and at a younger age if you are overweight or have a high risk for diabetes. What should I know about preventing infection? Hepatitis B If you have a higher risk for hepatitis B, you should be screened for this virus. Talk with your health care provider to find out if you are at risk for hepatitis B infection. Hepatitis C Blood testing is recommended for:  Everyone born from 1945 through 1965.  Anyone with known risk factors for hepatitis C. Sexually transmitted infections (STIs)  You should be screened each year   for STIs, including gonorrhea and chlamydia, if: ? You are sexually active and are younger than 51 years of age. ? You are older than 51 years of age and your health care provider tells you that you are at risk for this type of infection. ? Your sexual activity has changed since you were last screened, and you are at increased risk for chlamydia or gonorrhea. Ask your health care provider if you are at risk.  Ask your  health care provider about whether you are at high risk for HIV. Your health care provider may recommend a prescription medicine to help prevent HIV infection. If you choose to take medicine to prevent HIV, you should first get tested for HIV. You should then be tested every 3 months for as long as you are taking the medicine. Follow these instructions at home: Lifestyle  Do not use any products that contain nicotine or tobacco, such as cigarettes, e-cigarettes, and chewing tobacco. If you need help quitting, ask your health care provider.  Do not use street drugs.  Do not share needles.  Ask your health care provider for help if you need support or information about quitting drugs. Alcohol use  Do not drink alcohol if your health care provider tells you not to drink.  If you drink alcohol: ? Limit how much you have to 0-2 drinks a day. ? Be aware of how much alcohol is in your drink. In the [place] one drink equals one 12 oz bottle of beer (355 mL), one 5 oz glass of wine (148 mL), or one 1 oz glass of hard liquor (44 mL). General instructions  Schedule regular health, dental, and eye exams.  Stay current with your vaccines.  Tell your health care provider if: ? You often feel depressed. ? You have ever been abused or do not feel safe at home. Summary  Adopting a healthy lifestyle and getting preventive care are important in promoting health and wellness.  Follow your health care provider's instructions about healthy diet, exercising, and getting tested or screened for diseases.  Follow your health care provider's instructions on monitoring your cholesterol and blood pressure. This information is not intended to replace advice given to you by your health care provider. Make sure you discuss any questions you have with your health care provider. Document Revised: 11/22/2018 Document Reviewed: 11/22/2018 Elsevier Patient Education  2020 [employer].   Exercising to Lose  Weight Exercise is structured, repetitive physical activity to improve fitness and health. Getting regular exercise is important for everyone. It is especially important if you are overweight. Being overweight increases your risk of heart disease, stroke, diabetes, high blood pressure, and several types of cancer. Reducing your calorie intake and exercising can help you lose weight. Exercise is usually categorized as moderate or vigorous intensity. To lose weight, most people need to do a certain amount of moderate-intensity or vigorous-intensity exercise each week. Moderate-intensity exercise  Moderate-intensity exercise is any activity that gets you moving enough to burn at least three times more energy (calories) than if you were sitting. Examples of moderate exercise include:  Walking a mile in 15 minutes.  Doing light yard work.  Biking at an easy pace. Most people should get at least 150 minutes (2 hours and 30 minutes) a week of moderate-intensity exercise to maintain their body weight. Vigorous-intensity exercise Vigorous-intensity exercise is any activity that gets you moving enough to burn at least six times more calories than if you were sitting. When you exercise  at this intensity, you should be working hard enough that you are not able to carry on a conversation. Examples of vigorous exercise include:  Running.  Playing a team sport, such as football, basketball, and soccer.  Jumping rope. Most people should get at least 75 minutes (1 hour and 15 minutes) a week of vigorous-intensity exercise to maintain their body weight. How can exercise affect me? When you exercise enough to burn more calories than you eat, you lose weight. Exercise also reduces body fat and builds muscle. The more muscle you have, the more calories you burn. Exercise also:  Improves mood.  Reduces stress and tension.  Improves your overall fitness, flexibility, and endurance.  Increases bone  strength. The amount of exercise you need to lose weight depends on:  Your age.  The type of exercise.  Any health conditions you have.  Your overall physical ability. Talk to your health care provider about how much exercise you need and what types of activities are safe for you. What actions can I take to lose weight? Nutrition   Make changes to your diet as told by your health care provider or diet and nutrition specialist (dietitian). This may include: ? Eating fewer calories. ? Eating more protein. ? Eating less unhealthy fats. ? Eating a diet that includes fresh fruits and vegetables, whole grains, low-fat dairy products, and lean protein. ? Avoiding foods with added fat, salt, and sugar.  Drink plenty of water while you exercise to prevent dehydration or heat stroke. Activity  Choose an activity that you enjoy and set realistic goals. Your health care provider can help you make an exercise plan that works for you.  Exercise at a moderate or vigorous intensity most days of the week. ? The intensity of exercise may vary from person to person. You can tell how intense a workout is for you by paying attention to your breathing and heartbeat. Most people will notice their breathing and heartbeat get faster with more intense exercise.  Do resistance training twice each week, such as: ? Push-ups. ? Sit-ups. ? Lifting weights. ? Using resistance bands.  Getting short amounts of exercise can be just as helpful as long structured periods of exercise. If you have trouble finding time to exercise, try to include exercise in your daily routine. ? Get up, stretch, and walk around every 30 minutes throughout the day. ? Go for a walk during your lunch break. ? Park your car farther away from your destination. ? If you take public transportation, get off one stop early and walk the rest of the way. ? Make phone calls while standing up and walking around. ? Take the stairs instead of  elevators or escalators.  Wear comfortable clothes and shoes with good support.  Do not exercise so much that you hurt yourself, feel dizzy, or get very short of breath. Where to find more information  [place] Department of Health and Human Services: ThisPath.fi  Centers for Disease Control and Prevention (CDC): FootballExhibition.com.br Contact a health care provider:  Before starting a new exercise program.  If you have questions or concerns about your weight.  If you have a medical problem that keeps you from exercising. Get help right away if you have any of the following while exercising:  Injury.  Dizziness.  Difficulty breathing or shortness of breath that does not go away when you stop exercising.  Chest pain.  Rapid heartbeat. Summary  Being overweight increases your risk of heart disease, stroke, diabetes,  high blood pressure, and several types of cancer.  Losing weight happens when you burn more calories than you eat.  Reducing the amount of calories you eat in addition to getting regular moderate or vigorous exercise each week helps you lose weight. This information is not intended to replace advice given to you by your health care provider. Make sure you discuss any questions you have with your health care provider. Document Revised: 12/12/2017 Document Reviewed: 12/12/2017 Elsevier Patient Education  2020 [employer].

## 2020-09-30 LAB — CBC
Hematocrit: 43.6 % (ref 37.5–51.0)
Hemoglobin: 14.3 g/dL (ref 13.0–17.7)
MCH: 30 pg (ref 26.6–33.0)
MCHC: 32.8 g/dL (ref 31.5–35.7)
MCV: 92 fL (ref 79–97)
Platelets: 233 10*3/uL (ref 150–450)
RBC: 4.76 x10E6/uL (ref 4.14–5.80)
RDW: 12.6 % (ref 11.6–15.4)
WBC: 4.7 10*3/uL (ref 3.4–10.8)

## 2020-09-30 LAB — CMP14+EGFR
ALT: 29 IU/L (ref 0–44)
AST: 22 IU/L (ref 0–40)
Albumin/Globulin Ratio: 1.6 (ref 1.2–2.2)
Albumin: 4.4 g/dL (ref 4.0–5.0)
Alkaline Phosphatase: 86 IU/L (ref 44–121)
BUN/Creatinine Ratio: 13 (ref 9–20)
BUN: 13 mg/dL (ref 6–24)
Bilirubin Total: 0.6 mg/dL (ref 0.0–1.2)
CO2: 24 mmol/L (ref 20–29)
Calcium: 9.3 mg/dL (ref 8.7–10.2)
Chloride: 103 mmol/L (ref 96–106)
Creatinine, Ser: 1.02 mg/dL (ref 0.76–1.27)
GFR calc Af Amer: 99 mL/min/{1.73_m2} (ref >59)
GFR calc non Af Amer: 85 mL/min/{1.73_m2} (ref >59)
Globulin, Total: 2.8 g/dL (ref 1.5–4.5)
Glucose: 94 mg/dL (ref 65–99)
Potassium: 4.4 mmol/L (ref 3.5–5.2)
Sodium: 140 mmol/L (ref 134–144)
Total Protein: 7.2 g/dL (ref 6.0–8.5)

## 2020-09-30 LAB — LIPID PANEL
Chol/HDL Ratio: 3 ratio (ref 0.0–5.0)
Cholesterol, Total: 120 mg/dL (ref 100–199)
HDL: 40 mg/dL (ref >39)
LDL Chol Calc (NIH): 69 mg/dL (ref 0–99)
Triglycerides: 46 mg/dL (ref 0–149)
VLDL Cholesterol Cal: 11 mg/dL (ref 5–40)

## 2020-09-30 LAB — HEMOGLOBIN A1C
Est. average glucose Bld gHb Est-mCnc: 123 mg/dL
Hgb A1c MFr Bld: 5.9 % — ABNORMAL HIGH (ref 4.8–5.6)

## 2020-09-30 LAB — PSA: Prostate Specific Ag, Serum: 0.6 ng/mL (ref 0.0–4.0)

## 2020-10-01 ENCOUNTER — Telehealth: Payer: Self-pay

## 2020-10-01 NOTE — Telephone Encounter (Signed)
Patient and pharmacy both made aware that PA was approved. YL,RMA

## 2020-10-25 ENCOUNTER — Other Ambulatory Visit: Payer: Self-pay | Admitting: Nurse Practitioner

## 2020-10-25 MED ORDER — WEGOVY 1 MG/0.5ML ~~LOC~~ SOAJ: 1.0000 mg | SUBCUTANEOUS | 0 refills | Status: DC

## 2020-10-27 MED FILL — WEGOVY 1 MG/0.5ML SOAJ: 1 | 28 days supply | Qty: 2 | Fill #0

## 2020-10-29 ENCOUNTER — Encounter: Payer: Self-pay | Admitting: Nurse Practitioner

## 2020-10-29 ENCOUNTER — Other Ambulatory Visit: Payer: Self-pay

## 2020-10-29 ENCOUNTER — Ambulatory Visit: Payer: BC Managed Care – PPO | Admitting: Nurse Practitioner

## 2020-10-29 ENCOUNTER — Other Ambulatory Visit: Payer: Self-pay | Admitting: Nurse Practitioner

## 2020-10-29 VITALS — BP 118/76 | HR 84 | Temp 97.7°F | Ht 67.2 in | Wt 308.4 lb

## 2020-10-29 DIAGNOSIS — Z6841 Body Mass Index (BMI) 40.0 and over, adult: Secondary | ICD-10-CM

## 2020-10-29 DIAGNOSIS — J302 Other seasonal allergic rhinitis: Secondary | ICD-10-CM | POA: Diagnosis not present

## 2020-10-29 MED ORDER — WEGOVY 1.7 MG/0.75ML ~~LOC~~ SOAJ: 1.7000 mg | SUBCUTANEOUS | 1 refills | Status: DC

## 2020-10-29 NOTE — Patient Instructions (Addendum)

## 2020-10-29 NOTE — Progress Notes (Signed)
I,Alex Harrison as a Neurosurgeon for [employer], FNP.,have documented all relevant documentation on the behalf of Alex Felts, FNP,as directed by  Alex Felts, FNP while in the presence of Alex Felts, FNP. This visit occurred during the SARS-CoV-2 public health emergency.  Safety protocols were in place, including screening questions prior to the visit, additional usage of staff PPE, and extensive cleaning of exam room while observing appropriate contact time as indicated for disinfecting solutions.  Subjective:     Patient ID: Alex Harrison , male    DOB: 13-Jul-1971 , 49 y.o.   MRN: [id number]   Chief Complaint  Patient presents with  . Weight Check    HPI  Patient here for a weight check. He has been walking 2 miles 3 times a week. Will have a mild pain to his back but will go away.  Continues with wegovy 1 mg weekly. He is not craving sweets but will still eat chips.    Wt Readings from Last 3 Encounters: 10/29/20 : (!) 308 lb 6.4 oz (139.9 kg) 09/29/20 : (!) 311 lb (141.1 kg) 08/20/20 : (!) 310 lb (140.6 kg)    Past Medical History:  Diagnosis Date  . Allergic rhinitis   . Allergy    seasonal allergic rhinitis  . Diabetes mellitus without complication (HCC)   . GERD (gastroesophageal reflux disease)    past hx   . Gout   . Hyperlipidemia   . Hypertension   . Left arm pain   . Left elbow pain   . Macular degeneration    shots every 3 months   . Morbid obesity (HCC)   . Neck nodule 09/16/10   right      Family History  Problem Relation Age of Onset  . Hypertension Mother   . Diabetes Mother   . Stroke Mother   . Hypertension Father   . Diabetes Father   . Diabetes Sister   . Hypertension Sister   . Colon cancer Neg Hx   . Colon polyps Neg Hx   . Esophageal cancer Neg Hx   . Rectal cancer Neg Hx   . Stomach cancer Neg Hx      Current Outpatient Medications:  .  allopurinol (ZYLOPRIM) 300 MG tablet, Take 1 tablet (300 mg total) by mouth  daily., Disp: 90 tablet, Rfl: 1 .  atorvastatin (LIPITOR) 40 MG tablet, Take 1 tablet (40 mg total) by mouth at bedtime., Disp: 90 tablet, Rfl: 1 .  Calcium Carbonate (CALCIUM 600 PO), Take 1 tablet by mouth daily at 12 noon., Disp: , Rfl:  .  Cholecalciferol (VITAMIN D-3) 5000 UNITS TABS, Take by mouth.  , Disp: , Rfl:  .  fexofenadine (ALLEGRA) 180 MG tablet, Take 180 mg by mouth daily., Disp: , Rfl:  .  MAGNESIUM GLUCONATE PO, Take 30 mg by mouth daily., Disp: , Rfl:  .  mometasone (ELOCON) 0.1 % cream, Apply to affected area daily, Disp: 45 g, Rfl: 1 .  mometasone (NASONEX) 50 MCG/ACT nasal spray, Use 2 spray(s) in each nostril once daily, Disp: 17 g, Rfl: 0 .  Omega-3 Fatty Acids (FISH OIL PO), Take by mouth., Disp: , Rfl:  .  meloxicam (MOBIC) 15 MG tablet, Take 1 tablet (15 mg total) by mouth daily., Disp: 30 tablet, Rfl: 0 .  Semaglutide-Weight Management (WEGOVY) 1.7 MG/0.75ML SOAJ, Inject 1.7 mg into the skin once a week., Disp: 3 mL, Rfl: 1   Allergies  Allergen Reactions  . Niaspan [Niacin Er]  Skin burning and itching and painful to touch.     Review of Systems  Constitutional: Negative.   HENT: Negative.   Respiratory: Negative.   Cardiovascular: Negative.  Negative for chest pain, palpitations and leg swelling.  Musculoskeletal: Negative.   Skin: Negative.   Neurological: Negative.   Psychiatric/Behavioral: Negative.      Today's Vitals   10/29/20 1131  BP: 118/76  Pulse: 84  Temp: 97.7 F (36.5 C)  TempSrc: Oral  Weight: (!) 308 lb 6.4 oz (139.9 kg)  Height: 5' 7.2" (1.707 m)  PainSc: 0-No pain   Body mass index is 48.02 kg/m.   Objective:  Physical Exam Vitals reviewed.  Constitutional:      General: He is not in acute distress.    Appearance: Normal appearance. He is obese.  Cardiovascular:     Rate and Rhythm: Normal rate and regular rhythm.     Pulses: Normal pulses.     Heart sounds: Normal heart sounds. No murmur heard.   Pulmonary:      Effort: Pulmonary effort is normal. No respiratory distress.     Breath sounds: Normal breath sounds. No wheezing.  Skin:    Capillary Refill: Capillary refill takes less than 2 seconds.  Neurological:     General: No focal deficit present.     Mental Status: He is alert and oriented to person, place, and time.     Cranial Nerves: No cranial nerve deficit.  Psychiatric:        Mood and Affect: Mood normal.        Behavior: Behavior normal.        Thought Content: Thought content normal.        Judgment: Judgment normal.         Assessment And Plan:     1. Class 3 severe obesity due to excess calories without serious comorbidity with body mass index (BMI) of 45.0 to 49.9 in adult [facility])  Chronic  Discussed healthy diet and regular exercise options   Encouraged to exercise at least 150 minutes per week with 2 days of strength training - he is encouraged to change up his exercise routine  Continue with Saxenda   Return in 2 months for weight check.  Wt Readings from Last 3 Encounters:  10/29/20 (!) 308 lb 6.4 oz (139.9 kg)  09/29/20 (!) 311 lb (141.1 kg)  08/20/20 (!) 310 lb (140.6 kg)     2. Seasonal allergies  Continues to have intermittent symptoms of allergies  I have encouraged him to try xyzal and nasal spray    Patient was given opportunity to ask questions. Patient verbalized understanding of the plan and was able to repeat key elements of the plan. All questions were answered to their satisfaction.     Jeanell Sparrow, FNP, have reviewed all documentation for this visit. The documentation on 11/11/20 for the exam, diagnosis, procedures, and orders are all accurate and complete.  THE PATIENT IS ENCOURAGED TO PRACTICE SOCIAL DISTANCING DUE TO THE COVID-19 PANDEMIC.

## 2020-10-30 ENCOUNTER — Other Ambulatory Visit: Payer: Self-pay | Admitting: Sports Medicine

## 2020-10-30 ENCOUNTER — Encounter: Payer: Self-pay | Admitting: Sports Medicine

## 2020-10-30 ENCOUNTER — Ambulatory Visit (INDEPENDENT_AMBULATORY_CARE_PROVIDER_SITE_OTHER): Payer: BC Managed Care – PPO

## 2020-10-30 ENCOUNTER — Ambulatory Visit: Payer: BC Managed Care – PPO | Admitting: Sports Medicine

## 2020-10-30 DIAGNOSIS — M2142 Flat foot [pes planus] (acquired), left foot: Secondary | ICD-10-CM

## 2020-10-30 DIAGNOSIS — M729 Fibroblastic disorder, unspecified: Secondary | ICD-10-CM

## 2020-10-30 DIAGNOSIS — M79672 Pain in left foot: Secondary | ICD-10-CM

## 2020-10-30 DIAGNOSIS — M792 Neuralgia and neuritis, unspecified: Secondary | ICD-10-CM

## 2020-10-30 DIAGNOSIS — M2141 Flat foot [pes planus] (acquired), right foot: Secondary | ICD-10-CM

## 2020-10-30 MED ORDER — MELOXICAM 15 MG PO TABS: 15.0000 mg | ORAL_TABLET | Freq: Every day | ORAL | 0 refills | Status: DC

## 2020-10-30 NOTE — Progress Notes (Signed)
Subjective: Alex Harrison is a 49 y.o. male patient who presents to office for evaluation of left plantar arch/midfoot pain feels a pinching sensation worse in the morning, has not tried treatment. Patient denies any other pedal complaints.   Review of Systems  All other systems reviewed and are negative.    Patient Active Problem List   Diagnosis Date Noted  . Chronic bilateral low back pain without sciatica 04/23/2019  . Nephrolithiasis 09/09/2018  . Metabolic syndrome 01/15/2016  . Prediabetes 01/15/2016  . Hyperglycemia 04/08/2015  . Hypertension   . Pes planus of both feet 03/04/2014  . Allergy   . Vitamin D deficiency 07/26/2013  . HLD (hyperlipidemia) 04/24/2013  . Class 3 severe obesity due to excess calories without serious comorbidity with body mass index (BMI) of 45.0 to 49.9 in adult (HCC) 04/24/2013  . Gout 04/24/2013   Current Outpatient Medications on File Prior to Visit  Medication Sig Dispense Refill  . allopurinol (ZYLOPRIM) 300 MG tablet Take 1 tablet (300 mg total) by mouth daily. 90 tablet 1  . atorvastatin (LIPITOR) 40 MG tablet Take 1 tablet (40 mg total) by mouth at bedtime. 90 tablet 1  . Calcium Carbonate (CALCIUM 600 PO) Take 1 tablet by mouth daily at 12 noon.    . Cholecalciferol (VITAMIN D-3) 5000 UNITS TABS Take by mouth.      . fexofenadine (ALLEGRA) 180 MG tablet Take 180 mg by mouth daily.    Marland Kitchen MAGNESIUM GLUCONATE PO Take 30 mg by mouth daily.    . mometasone (ELOCON) 0.1 % cream Apply to affected area daily 45 g 1  . mometasone (NASONEX) 50 MCG/ACT nasal spray Use 2 spray(s) in each nostril once daily 17 g 0  . Omega-3 Fatty Acids (FISH OIL PO) Take by mouth.    . Semaglutide-Weight Management (WEGOVY) 1.7 MG/0.75ML SOAJ Inject 1.7 mg into the skin once a week. 3 mL 1   No current facility-administered medications on file prior to visit.   Allergies  Allergen Reactions  . Niaspan [Niacin Er]     Skin burning and itching and painful to touch.      Objective:  General: Alert and oriented x3 in no acute distress  Dermatology: No open lesions bilateral lower extremities, no webspace macerations, no ecchymosis bilateral, all nails x 10 are well manicured.  Vascular: Dorsalis Pedis and Posterior Tibial pedal pulses 1/4, Capillary Fill Time 3 seconds, (+) pedal hair growth bilateral, no edema bilateral lower extremities, Temperature gradient within normal limits.  Neurology: Michaell Cowing sensation intact via light touch bilateral.  Musculoskeletal: Mild tenderness with palpation along medial arch, medial fascial band on Left, no tenderness along Posterior tibial tendon course with medial soft tissue buldge noted, +pes planus   Xrays Left foot:  Normal osseous mineralization. Joint spaces preserved except at midfoot. No fracture/dislocation/boney destruction. Mild 1st ray elevatus present. Increased Talar head uncovering present. Anterior break in cyma line with midtarsal breach present. Increased Talar declination present. Decreased calcaneal inclination present.  No soft tissue abnormalities or radiopaque foreign bodies. + heel spurs.     Assessment and Plan: Problem List Items Addressed This Visit      Other   Pes planus of both feet    Other Visit Diagnoses    Pain in left foot    -  Primary   Relevant Orders   DG Foot Complete Left   Neuritis       Arch pain of left foot  Fasciitis          -Complete examination performed -Xrays reviewed -Discussed treatement options for likely pinched nerve secondary to foot type and eqinus with heel spur on xray  -Dispensed plantar fascial brace to wear as instructed on left -Rx Mobic -Educated patient on proper stretching and good supportive shoes for foot type -Patient to return to office as in 1 month or sooner if condition worsens.  Asencion Islam, DPM

## 2020-11-25 ENCOUNTER — Other Ambulatory Visit: Payer: Self-pay | Admitting: Nurse Practitioner

## 2020-11-25 DIAGNOSIS — Z6841 Body Mass Index (BMI) 40.0 and over, adult: Secondary | ICD-10-CM

## 2020-11-27 MED FILL — WEGOVY 1.7 MG/0.75ML SOAJ: 1.7 | 28 days supply | Qty: 3 | Fill #0

## 2020-12-04 ENCOUNTER — Other Ambulatory Visit: Payer: Self-pay

## 2020-12-04 ENCOUNTER — Ambulatory Visit: Payer: BC Managed Care – PPO | Admitting: Sports Medicine

## 2020-12-04 DIAGNOSIS — M2142 Flat foot [pes planus] (acquired), left foot: Secondary | ICD-10-CM

## 2020-12-04 DIAGNOSIS — M729 Fibroblastic disorder, unspecified: Secondary | ICD-10-CM

## 2020-12-04 DIAGNOSIS — M79672 Pain in left foot: Secondary | ICD-10-CM | POA: Diagnosis not present

## 2020-12-04 DIAGNOSIS — M2141 Flat foot [pes planus] (acquired), right foot: Secondary | ICD-10-CM

## 2020-12-04 MED ORDER — TRIAMCINOLONE ACETONIDE 10 MG/ML IJ SUSP
10.0000 mg | Freq: Once | INTRAMUSCULAR | Status: AC
  Administered 2020-12-04: 11:00:00 10 mg

## 2020-12-04 NOTE — Progress Notes (Signed)
Subjective: Alex Harrison is a 49 y.o. male patient who returns to office for evaluation of left plantar arch/midfoot pain, reports that he still feels a pinching sensation worse in the morning at his heel and states that brace hurts not sure if he is wearing it correctly. No other issues noted.  Patient Active Problem List   Diagnosis Date Noted  . Chronic bilateral low back pain without sciatica 04/23/2019  . Nephrolithiasis 09/09/2018  . Metabolic syndrome 01/15/2016  . Prediabetes 01/15/2016  . Hyperglycemia 04/08/2015  . Hypertension   . Pes planus of both feet 03/04/2014  . Allergy   . Vitamin D deficiency 07/26/2013  . HLD (hyperlipidemia) 04/24/2013  . Class 3 severe obesity due to excess calories without serious comorbidity with body mass index (BMI) of 45.0 to 49.9 in adult (HCC) 04/24/2013  . Gout 04/24/2013   Current Outpatient Medications on File Prior to Visit  Medication Sig Dispense Refill  . allopurinol (ZYLOPRIM) 300 MG tablet Take 1 tablet (300 mg total) by mouth daily. 90 tablet 1  . atorvastatin (LIPITOR) 40 MG tablet Take 1 tablet (40 mg total) by mouth at bedtime. 90 tablet 1  . Calcium Carbonate (CALCIUM 600 PO) Take 1 tablet by mouth daily at 12 noon.    . Cholecalciferol (VITAMIN D-3) 5000 UNITS TABS Take by mouth.      . fexofenadine (ALLEGRA) 180 MG tablet Take 180 mg by mouth daily.    Marland Kitchen MAGNESIUM GLUCONATE PO Take 30 mg by mouth daily.    . meloxicam (MOBIC) 15 MG tablet Take 1 tablet (15 mg total) by mouth daily. 30 tablet 0  . mometasone (ELOCON) 0.1 % cream Apply to affected area daily 45 g 1  . mometasone (NASONEX) 50 MCG/ACT nasal spray Use 2 spray(s) in each nostril once daily 17 g 0  . Omega-3 Fatty Acids (FISH OIL PO) Take by mouth.    . Semaglutide-Weight Management (WEGOVY) 1.7 MG/0.75ML SOAJ Inject 1.7 mg into the skin once a week. 3 mL 1   No current facility-administered medications on file prior to visit.   Allergies  Allergen  Reactions  . Niaspan [Niacin Er]     Skin burning and itching and painful to touch.     Objective:  General: Alert and oriented x3 in no acute distress  Dermatology: No open lesions bilateral lower extremities, no webspace macerations, no ecchymosis bilateral, all nails x 10 are well manicured.  Vascular: Dorsalis Pedis and Posterior Tibial pedal pulses 1/4, Capillary Fill Time 3 seconds, (+) pedal hair growth bilateral, no edema bilateral lower extremities, Temperature gradient within normal limits.  Neurology: Gross sensation intact via light touch bilateral.  Musculoskeletal: Mild tenderness with palpation along medial arch, central>medial fascial band on Left, no tenderness along Posterior tibial tendon course with medial soft tissue buldge noted, +pes planus     Assessment and Plan: Problem List Items Addressed This Visit      Other   Pes planus of both feet    Other Visit Diagnoses    Arch pain of left foot    -  Primary   Pain in left foot       Fasciitis       Relevant Medications   triamcinolone acetonide (KENALOG) 10 MG/ML injection 10 mg (Completed)      -Complete examination performed -Discussed treatement options for likely pinched nerve secondary to foot type and eqinus with heel spur on xray as previous noted -After oral consent and aseptic prep, injected  a mixture containing 1 ml of 2%  plain lidocaine, 1 ml 0.5% plain marcaine, 0.5 ml of kenalog 10 and 0.5 ml of dexamethasone phosphate into left heel plantar approach at PF insertion without complication. Post-injection care discussed with patient.  -Continue with plantar fascial brace to wear as instructed on left -Continue with Mobic until completed -Continue stretching and good supportive shoes for foot type -Patient to return to office as in 4-6 weeks or sooner if condition worsens.  Asencion Islam, DPM

## 2020-12-05 ENCOUNTER — Encounter: Payer: Self-pay | Admitting: Sports Medicine

## 2020-12-30 ENCOUNTER — Ambulatory Visit: Payer: BC Managed Care – PPO | Admitting: Nurse Practitioner

## 2021-01-01 MED FILL — WEGOVY 1.7 MG/0.75ML SOAJ: 1.7 | 28 days supply | Qty: 3 | Fill #1

## 2021-01-07 ENCOUNTER — Other Ambulatory Visit: Payer: Self-pay

## 2021-01-07 ENCOUNTER — Encounter: Payer: Self-pay | Admitting: Nurse Practitioner

## 2021-01-07 ENCOUNTER — Ambulatory Visit: Payer: BC Managed Care – PPO | Admitting: Nurse Practitioner

## 2021-01-07 ENCOUNTER — Other Ambulatory Visit: Payer: Self-pay | Admitting: Nurse Practitioner

## 2021-01-07 VITALS — BP 118/76 | HR 80 | Temp 98.1°F | Ht 68.0 in | Wt 316.2 lb

## 2021-01-07 DIAGNOSIS — Z6841 Body Mass Index (BMI) 40.0 and over, adult: Secondary | ICD-10-CM | POA: Diagnosis not present

## 2021-01-07 DIAGNOSIS — K649 Unspecified hemorrhoids: Secondary | ICD-10-CM

## 2021-01-07 MED ORDER — HYDROCORTISONE (PERIANAL) 2.5 % EX CREA
1.0000 | TOPICAL_CREAM | Freq: Two times a day (BID) | CUTANEOUS | 0 refills | Status: DC
Start: 2021-01-07 — End: 2021-06-08

## 2021-01-07 MED ORDER — WEGOVY 2.4 MG/0.75ML ~~LOC~~ SOAJ: 2.4000 mg | SUBCUTANEOUS | 0 refills | Status: DC

## 2021-01-07 NOTE — Patient Instructions (Signed)
Obesity, Adult Obesity is the condition of having too much total body fat. Being overweight or obese means that your weight is greater than what is considered healthy for your body size. Obesity is determined by a measurement called BMI. BMI is an estimate of body fat and is calculated from height and weight. For adults, a BMI of 30 or higher is considered obese. Obesity can lead to other health concerns and major illnesses, including:  Stroke.  Coronary artery disease (CAD).  Type 2 diabetes.  Some types of cancer, including cancers of the colon, breast, uterus, and gallbladder.  Osteoarthritis.  High blood pressure (hypertension).  High cholesterol.  Sleep apnea.  Gallbladder stones.  Infertility problems. What are the causes? Common causes of this condition include:  Eating daily meals that are high in calories, sugar, and fat.  Being born with genes that may make you more likely to become obese.  Having a medical condition that causes obesity, including: ? Hypothyroidism. ? Polycystic ovarian syndrome (PCOS). ? Binge-eating disorder. ? Cushing syndrome.  Taking certain medicines, such as steroids, antidepressants, and seizure medicines.  Not being physically active (sedentary lifestyle).  Not getting enough sleep.  Drinking high amounts of sugar-sweetened beverages, such as soft drinks. What increases the risk? The following factors may make you more likely to develop this condition:  Having a family history of obesity.  Being a woman of African American descent.  Being a man of Hispanic descent.  Living in an area with limited access to: ? Parks, recreation centers, or sidewalks. ? Healthy food choices, such as grocery stores and farmers' markets. What are the signs or symptoms? The main sign of this condition is having too much body fat. How is this diagnosed? This condition is diagnosed based on:  Your BMI. If you are an adult with a BMI of 30 or  higher, you are considered obese.  Your waist circumference. This measures the distance around your waistline.  Your skinfold thickness. Your health care provider may gently pinch a fold of your skin and measure it. You may have other tests to check for underlying conditions. How is this treated? Treatment for this condition often includes changing your lifestyle. Treatment may include some or all of the following:  Dietary changes. This may include developing a healthy meal plan.  Regular physical activity. This may include activity that causes your heart to beat faster (aerobic exercise) and strength training. Work with your health care provider to design an exercise program that works for you.  Medicine to help you lose weight if you are unable to lose 1 pound a week after 6 weeks of healthy eating and more physical activity.  Treating conditions that cause the obesity (underlying conditions).  Surgery. Surgical options may include gastric banding and gastric bypass. Surgery may be done if: ? Other treatments have not helped to improve your condition. ? You have a BMI of 40 or higher. ? You have life-threatening health problems related to obesity. Follow these instructions at home: Eating and drinking  Follow recommendations from your health care provider about what you eat and drink. Your health care provider may advise you to: ? Limit fast food, sweets, and processed snack foods. ? Choose low-fat options, such as low-fat milk instead of whole milk. ? Eat 5 or more servings of fruits or vegetables every day. ? Eat at home more often. This gives you more control over what you eat. ? Choose healthy foods when you eat out. ? Learn   to read food labels. This will help you understand how much food is considered 1 serving. ? Learn what a healthy serving size is. ? Keep low-fat snacks available. ? Limit sugary drinks, such as soda, fruit juice, sweetened iced tea, and flavored  milk.  Drink enough water to keep your urine pale yellow.  Do not follow a fad diet. Fad diets can be unhealthy and even dangerous.   Physical activity  Exercise regularly, as told by your health care provider. ? Most adults should get up to 150 minutes of moderate-intensity exercise every week. ? Ask your health care provider what types of exercise are safe for you and how often you should exercise.  Warm up and stretch before being active.  Cool down and stretch after being active.  Rest between periods of activity. Lifestyle  Work with your health care provider and a dietitian to set a weight-loss goal that is healthy and reasonable for you.  Limit your screen time.  Find ways to reward yourself that do not involve food.  Do not drink alcohol if: ? Your health care provider tells you not to drink. ? You are pregnant, may be pregnant, or are planning to become pregnant.  If you drink alcohol: ? Limit how much you use to:  0-1 drink a day for women.  0-2 drinks a day for men. ? Be aware of how much alcohol is in your drink. In the [place] one drink equals one 12 oz bottle of beer (355 mL), one 5 oz glass of wine (148 mL), or one 1 oz glass of hard liquor (44 mL). General instructions  Keep a weight-loss journal to keep track of the food you eat and how much exercise you get.  Take over-the-counter and prescription medicines only as told by your health care provider.  Take vitamins and supplements only as told by your health care provider.  Consider joining a support group. Your health care provider may be able to recommend a support group.  Keep all follow-up visits as told by your health care provider. This is important. Contact a health care provider if:  You are unable to meet your weight loss goal after 6 weeks of dietary and lifestyle changes. Get help right away if you are having:  Trouble breathing.  Suicidal thoughts or behaviors. Summary  Obesity is  the condition of having too much total body fat.  Being overweight or obese means that your weight is greater than what is considered healthy for your body size.  Work with your health care provider and a dietitian to set a weight-loss goal that is healthy and reasonable for you.  Exercise regularly, as told by your health care provider. Ask your health care provider what types of exercise are safe for you and how often you should exercise. This information is not intended to replace advice given to you by your health care provider. Make sure you discuss any questions you have with your health care provider. Document Revised: 08/03/2018 Document Reviewed: 08/03/2018 Elsevier Patient Education  2021 [employer] Inc.  

## 2021-01-07 NOTE — Progress Notes (Signed)
I,[name],acting as a Neurosurgeon for [employer], NP.,have documented all relevant documentation on the behalf of [employer], NP,as directed by  Charlesetta Ivory, NP while in the presence of Charlesetta Ivory, NP.  This visit occurred during the SARS-CoV-2 public health emergency.  Safety protocols were in place, including screening questions prior to the visit, additional usage of staff PPE, and extensive cleaning of exam room while observing appropriate contact time as indicated for disinfecting solutions.  Subjective:     Patient ID: Alex Harrison , male    DOB: 1971-05-15 , 50 y.o.   MRN: [id number]   Chief Complaint  Patient presents with  . Weight Check    HPI  Patient is here for a weight check. He has been walking 2 miles 3 times a week but sometimes he does not meet his goal.   He has not been exercising much or watching his diet. He eats a lot of sweets, drinks a lot of tea with half sugar and he snacks on a lot of cookies. He is also having some hemorrhoids. He has tried OTC cream for it but hasn't helped much. He had a colonoscopy  Last year in 2021. He is also having some hemorrhoids he recently noticed. He has tried OTC preparation H without much help.   Wt Readings from Last 3 Encounters: 01/07/21 : (!) 316 lb 3.2 oz (143.4 kg) 10/29/20 : (!) 308 lb 6.4 oz (139.9 kg) 09/29/20 : (!) 311 lb (141.1 kg)      Past Medical History:  Diagnosis Date  . Allergic rhinitis   . Allergy    seasonal allergic rhinitis  . Diabetes mellitus without complication (HCC)   . GERD (gastroesophageal reflux disease)    past hx   . Gout   . Hyperlipidemia   . Hypertension   . Left arm pain   . Left elbow pain   . Macular degeneration    shots every 3 months   . Morbid obesity (HCC)   . Neck nodule 09/16/10   right      Family History  Problem Relation Age of Onset  . Hypertension Mother   . Diabetes Mother   . Stroke Mother   . Hypertension Father   .  Diabetes Father   . Diabetes Sister   . Hypertension Sister   . Colon cancer Neg Hx   . Colon polyps Neg Hx   . Esophageal cancer Neg Hx   . Rectal cancer Neg Hx   . Stomach cancer Neg Hx      Current Outpatient Medications:  .  allopurinol (ZYLOPRIM) 300 MG tablet, Take 1 tablet (300 mg total) by mouth daily., Disp: 90 tablet, Rfl: 1 .  atorvastatin (LIPITOR) 40 MG tablet, Take 1 tablet (40 mg total) by mouth at bedtime., Disp: 90 tablet, Rfl: 1 .  Calcium Carbonate (CALCIUM 600 PO), Take 1 tablet by mouth daily at 12 noon., Disp: , Rfl:  .  Cholecalciferol (VITAMIN D-3) 5000 UNITS TABS, Take by mouth., Disp: , Rfl:  .  fexofenadine (ALLEGRA) 180 MG tablet, Take 180 mg by mouth daily., Disp: , Rfl:  .  hydrocortisone (ANUSOL-HC) 2.5 % rectal cream, Place 1 application rectally 2 (two) times daily., Disp: 30 g, Rfl: 0 .  MAGNESIUM GLUCONATE PO, Take 30 mg by mouth daily., Disp: , Rfl:  .  mometasone (ELOCON) 0.1 % cream, Apply to affected area daily, Disp: 45 g, Rfl: 1 .  mometasone (NASONEX) 50 MCG/ACT nasal spray, Use 2  spray(s) in each nostril once daily, Disp: 17 g, Rfl: 0 .  Omega-3 Fatty Acids (FISH OIL PO), Take by mouth., Disp: , Rfl:  .  Semaglutide-Weight Management (WEGOVY) 2.4 MG/0.75ML SOAJ, Inject 2.4 mg into the skin once a week., Disp: 3 mL, Rfl: 0   Allergies  Allergen Reactions  . Niaspan [Niacin Er]     Skin burning and itching and painful to touch.     Review of Systems  Constitutional: Negative.  Negative for fatigue and fever.  Respiratory: Negative.  Negative for cough, chest tightness and shortness of breath.   Cardiovascular: Negative.  Negative for chest pain.  Gastrointestinal: Positive for rectal pain.       Hemorrhoids   Neurological: Negative.  Negative for headaches.     Today's Vitals   01/07/21 0943  BP: 118/76  Pulse: 80  Temp: 98.1 F (36.7 C)  TempSrc: Oral  Weight: (!) 316 lb 3.2 oz (143.4 kg)  Height: 5\' 8"  (1.727 m)   Body mass  index is 48.08 kg/m.  Wt Readings from Last 3 Encounters:  01/07/21 (!) 316 lb 3.2 oz (143.4 kg)  10/29/20 (!) 308 lb 6.4 oz (139.9 kg)  09/29/20 (!) 311 lb (141.1 kg)    Objective:  Physical Exam Constitutional:      Appearance: Normal appearance. He is obese.  HENT:     Head: Normocephalic and atraumatic.     Nose: Rhinorrhea: hydrocortisone acetate.  Cardiovascular:     Rate and Rhythm: Normal rate and regular rhythm.     Pulses: Normal pulses.     Heart sounds: Normal heart sounds. No murmur heard.   Pulmonary:     Effort: Pulmonary effort is normal.     Breath sounds: Normal breath sounds. No wheezing.  Skin:    Capillary Refill: Capillary refill takes less than 2 seconds.  Neurological:     Mental Status: He is alert and oriented to person, place, and time.  Psychiatric:        Mood and Affect: Mood normal.      Assessment And Plan:     1. Class 3 severe obesity due to excess calories without serious comorbidity with body mass index (BMI) of 45.0 to 49.9 in adult [facility]) -Educated patient on the healthy lifestyle including making changes to his diet and exercise.  -Patient should eat 7 or more servings of fruits and vegetables per day. You should drink plenty of water to keep yourself hydrated and your kidneys healthy. This includes about 65-80+ fluid ounces of water. . - Avoid highly processed food and limit your salt intake if you have hypertension. Avoid foods high in saturated/Trans fats. -Along with eating right and exercising, aim for at least 7-9 hours of sleep daily.  Eat more whole grains which includes barley, wheat berries, oats, brown rice and whole wheat pasta. Use healthy plant oils which include olive, soy, corn, sunflower and peanut. Limit your caffeine and sugary drinks. Limit his intake of fast foods. Limit milk and dairy products to one or two daily servings.  - Semaglutide-Weight Management (WEGOVY) 2.4 MG/0.75ML SOAJ; Inject 2.4 mg into the skin once a  week.  Dispense: 3 mL; Refill: 0  2. Hemorrhoids, unspecified hemorrhoid type -Educated patient to increase his diet in fiber and also make changes to his diet that will help him with not staying constipated. Decrease the intake of fatty foods.   -Educated patient to not strain when he uses the bathroom  -Take OTC stool softener  as needed.  - hydrocortisone (ANUSOL-HC) 2.5 % rectal cream; Place 1 application rectally 2 (two) times daily.  Dispense: 30 g; Refill: 0   Patient was given opportunity to ask questions. Patient verbalized understanding of the plan and was able to repeat key elements of the plan. All questions were answered to their satisfaction.  Charlesetta Ivory, NP   I, Charlesetta Ivory, NP, have reviewed all documentation for this visit. The documentation on 01/07/21 for the exam, diagnosis, procedures, and orders are all accurate and complete.  THE PATIENT IS ENCOURAGED TO PRACTICE SOCIAL DISTANCING DUE TO THE COVID-19 PANDEMIC.

## 2021-01-15 ENCOUNTER — Other Ambulatory Visit: Payer: Self-pay

## 2021-01-15 ENCOUNTER — Ambulatory Visit: Payer: BC Managed Care – PPO | Admitting: Sports Medicine

## 2021-01-15 DIAGNOSIS — M79672 Pain in left foot: Secondary | ICD-10-CM | POA: Diagnosis not present

## 2021-01-15 DIAGNOSIS — M729 Fibroblastic disorder, unspecified: Secondary | ICD-10-CM

## 2021-01-15 DIAGNOSIS — M2141 Flat foot [pes planus] (acquired), right foot: Secondary | ICD-10-CM

## 2021-01-15 DIAGNOSIS — M2142 Flat foot [pes planus] (acquired), left foot: Secondary | ICD-10-CM | POA: Diagnosis not present

## 2021-01-15 NOTE — Patient Instructions (Signed)
W2376 Orthotics M21.41, M21.42 pes planus M72.2 Fasciitis

## 2021-01-15 NOTE — Progress Notes (Signed)
Subjective: Alex Harrison is a 50 y.o. male patient who returns to office for evaluation of left plantar arch/midfoot pain, reports that his pain is gone. Feels much better, injection, mobic, and brace has helped. No other issues noted.  Patient Active Problem List   Diagnosis Date Noted  . Chronic bilateral low back pain without sciatica 04/23/2019  . Nephrolithiasis 09/09/2018  . Metabolic syndrome 01/15/2016  . Prediabetes 01/15/2016  . Hyperglycemia 04/08/2015  . Hypertension   . Pes planus of both feet 03/04/2014  . Allergy   . Vitamin D deficiency 07/26/2013  . HLD (hyperlipidemia) 04/24/2013  . Class 3 severe obesity due to excess calories without serious comorbidity with body mass index (BMI) of 45.0 to 49.9 in adult (HCC) 04/24/2013  . Gout 04/24/2013   Current Outpatient Medications on File Prior to Visit  Medication Sig Dispense Refill  . allopurinol (ZYLOPRIM) 300 MG tablet Take 1 tablet (300 mg total) by mouth daily. 90 tablet 1  . atorvastatin (LIPITOR) 40 MG tablet Take 1 tablet (40 mg total) by mouth at bedtime. 90 tablet 1  . Calcium Carbonate (CALCIUM 600 PO) Take 1 tablet by mouth daily at 12 noon.    . Cholecalciferol (VITAMIN D-3) 5000 UNITS TABS Take by mouth.    . fexofenadine (ALLEGRA) 180 MG tablet Take 180 mg by mouth daily.    . hydrocortisone (ANUSOL-HC) 2.5 % rectal cream Place 1 application rectally 2 (two) times daily. 30 g 0  . MAGNESIUM GLUCONATE PO Take 30 mg by mouth daily.    . mometasone (ELOCON) 0.1 % cream Apply to affected area daily 45 g 1  . mometasone (NASONEX) 50 MCG/ACT nasal spray Use 2 spray(s) in each nostril once daily 17 g 0  . Omega-3 Fatty Acids (FISH OIL PO) Take by mouth.    . Semaglutide-Weight Management (WEGOVY) 2.4 MG/0.75ML SOAJ Inject 2.4 mg into the skin once a week. 3 mL 0   No current facility-administered medications on file prior to visit.   Allergies  Allergen Reactions  . Niaspan [Niacin Er]     Skin burning  and itching and painful to touch.     Objective:  General: Alert and oriented x3 in no acute distress  Dermatology: No open lesions bilateral lower extremities, no webspace macerations, no ecchymosis bilateral, all nails x 10 are well manicured.  Vascular: Dorsalis Pedis and Posterior Tibial pedal pulses 1/4, Capillary Fill Time 3 seconds, (+) pedal hair growth bilateral, no edema bilateral lower extremities, Temperature gradient within normal limits.  Neurology: Gross sensation intact via light touch bilateral.  Musculoskeletal: No tenderness with palpation along medial arch or fascial band on Left, no tenderness along Posterior tibial tendon course with medial soft tissue buldge noted, but no pain, +pes planus     Assessment and Plan: Problem List Items Addressed This Visit      Other   Pes planus of both feet    Other Visit Diagnoses    Arch pain of left foot    -  Primary   Pain in left foot       Fasciitis          -Complete examination performed -Discussed continued care for foot pain to prevent recurrence -Mobic has been completed no refills at this time unless pain recurs -Recommend patient to check with insurance re: coverage of orthotics and to schedule an appt with Raiford Noble -Meanwhile continue with plantar fascial brace wean as instructed -Continue with gentle stretching and good supportive shoes -  Return to office as scheduled or sooner if problems arise.  Asencion Islam, DPM

## 2021-01-29 ENCOUNTER — Other Ambulatory Visit: Payer: BC Managed Care – PPO | Admitting: Orthotics

## 2021-02-02 ENCOUNTER — Other Ambulatory Visit: Payer: Self-pay | Admitting: Nurse Practitioner

## 2021-02-09 MED FILL — WEGOVY 2.4 MG/0.75ML SOAJ: 2.4 | 28 days supply | Qty: 3 | Fill #0

## 2021-03-05 ENCOUNTER — Other Ambulatory Visit: Payer: Self-pay | Admitting: Nurse Practitioner

## 2021-03-05 DIAGNOSIS — Z6841 Body Mass Index (BMI) 40.0 and over, adult: Secondary | ICD-10-CM

## 2021-03-05 MED FILL — WEGOVY 2.4 MG/0.75ML SOAJ: 2.4 | 28 days supply | Qty: 3 | Fill #0

## 2021-03-29 ENCOUNTER — Other Ambulatory Visit: Payer: Self-pay | Admitting: Nurse Practitioner

## 2021-03-30 ENCOUNTER — Encounter: Payer: Self-pay | Admitting: Nurse Practitioner

## 2021-03-30 ENCOUNTER — Other Ambulatory Visit: Payer: Self-pay

## 2021-03-30 ENCOUNTER — Ambulatory Visit (INDEPENDENT_AMBULATORY_CARE_PROVIDER_SITE_OTHER): Payer: BC Managed Care – PPO | Admitting: Nurse Practitioner

## 2021-03-30 ENCOUNTER — Other Ambulatory Visit (HOSPITAL_COMMUNITY): Payer: Self-pay

## 2021-03-30 VITALS — BP 134/80 | HR 67 | Temp 97.6°F | Ht 68.0 in | Wt 311.4 lb

## 2021-03-30 DIAGNOSIS — E7849 Other hyperlipidemia: Secondary | ICD-10-CM | POA: Diagnosis not present

## 2021-03-30 DIAGNOSIS — I10 Essential (primary) hypertension: Secondary | ICD-10-CM

## 2021-03-30 DIAGNOSIS — Z6841 Body Mass Index (BMI) 40.0 and over, adult: Secondary | ICD-10-CM

## 2021-03-30 DIAGNOSIS — R7303 Prediabetes: Secondary | ICD-10-CM

## 2021-03-30 DIAGNOSIS — J302 Other seasonal allergic rhinitis: Secondary | ICD-10-CM

## 2021-03-30 MED ORDER — SEMAGLUTIDE-WEIGHT MANAGEMENT 2.4 MG/0.75ML ~~LOC~~ SOAJ
2.4000 mg | SUBCUTANEOUS | 1 refills | Status: DC
Start: 2021-03-30 — End: 2021-05-24
  Filled 2021-03-30: qty 3, 28d supply, fill #0
  Filled 2021-04-27: qty 3, 28d supply, fill #1

## 2021-03-30 MED ORDER — AZELASTINE HCL 0.1 % NA SOLN: 2.0000 | Freq: Two times a day (BID) | NASAL | 5 refills | Status: DC

## 2021-03-30 MED ORDER — LEVOCETIRIZINE DIHYDROCHLORIDE 5 MG PO TABS: 5.0000 mg | ORAL_TABLET | Freq: Every evening | ORAL | 1 refills | Status: DC

## 2021-03-30 NOTE — Progress Notes (Signed)
I,[name] as a Neurosurgeon for [employer], FNP.,have documented all relevant documentation on the behalf of [name], FNP,as directed by  [name], FNP while in the presence of [name], FNP. This visit occurred during the SARS-CoV-2 public health emergency.  Safety protocols were in place, including screening questions prior to the visit, additional usage of staff PPE, and extensive cleaning of exam room while observing appropriate contact time as indicated for disinfecting solutions.  Subjective:     Patient ID: Alex Harrison , male    DOB: [birth year] , 50 y.o.   MRN: [id number]   Chief Complaint  Patient presents with  . Prediabetes  . Hyperlipidemia    HPI  Patient presents today for a f/u on his prediabetes and chol. Continues Wegovy 2.4 mg weekly.  He has just started back walking this week he is drinking 4 bottles of water. Denies drinking any juices.   Wt Readings from Last 3 Encounters: 03/30/21 : (!) 311 lb 6.4 oz (141.3 kg) 01/07/21 : (!) 316 lb 3.2 oz (143.4 kg) 10/29/20 : (!) 308 lb 6.4 oz (139.9 kg)  Hyperlipidemia This is a chronic problem. The problem is controlled. Recent lipid tests were reviewed and are normal. There are no known factors aggravating his hyperlipidemia. Pertinent negatives include no chest pain. Risk factors for coronary artery disease include obesity.     Past Medical History:  Diagnosis Date  . Allergic rhinitis   . Allergy    seasonal allergic rhinitis  . Diabetes mellitus without complication (HCC)   . GERD (gastroesophageal reflux disease)    past hx   . Gout   . Hyperlipidemia   . Hypertension   . Left arm pain   . Left elbow pain   . Macular degeneration    shots every 3 months   . Morbid obesity (HCC)   . Neck nodule 09/16/10   right      Family History  Problem Relation Age of Onset  . Hypertension Mother   . Diabetes Mother   . Stroke Mother   . Hypertension Father   . Diabetes Father   .  Diabetes Sister   . Hypertension Sister   . Colon cancer Neg Hx   . Colon polyps Neg Hx   . Esophageal cancer Neg Hx   . Rectal cancer Neg Hx   . Stomach cancer Neg Hx      Current Outpatient Medications:  .  allopurinol (ZYLOPRIM) 300 MG tablet, Take 1 tablet (300 mg total) by mouth daily., Disp: 90 tablet, Rfl: 1 .  atorvastatin (LIPITOR) 40 MG tablet, Take 1 tablet (40 mg total) by mouth at bedtime., Disp: 90 tablet, Rfl: 1 .  azelastine (ASTELIN) 0.1 % nasal spray, Place 2 sprays into both nostrils 2 (two) times daily. Use in each nostril as directed, Disp: 30 mL, Rfl: 5 .  Calcium Carbonate (CALCIUM 600 PO), Take 1 tablet by mouth daily at 12 noon., Disp: , Rfl:  .  Cholecalciferol (VITAMIN D-3) 5000 UNITS TABS, Take by mouth., Disp: , Rfl:  .  hydrocortisone (ANUSOL-HC) 2.5 % rectal cream, Place 1 application rectally 2 (two) times daily., Disp: 30 g, Rfl: 0 .  levocetirizine (XYZAL) 5 MG tablet, Take 1 tablet (5 mg total) by mouth every evening., Disp: 90 tablet, Rfl: 1 .  MAGNESIUM GLUCONATE PO, Take 30 mg by mouth daily., Disp: , Rfl:  .  mometasone (ELOCON) 0.1 % cream, Apply to affected area daily, Disp: 45 g, Rfl:  1 .  Omega-3 Fatty Acids (FISH OIL PO), Take by mouth., Disp: , Rfl:  .  Semaglutide-Weight Management 2.4 MG/0.75ML SOAJ, Inject 2.4 mg into the skin once a week., Disp: 3 mL, Rfl: 1   Allergies  Allergen Reactions  . Niaspan [Niacin Er]     Skin burning and itching and painful to touch.     Review of Systems  Constitutional: Negative.   Respiratory: Negative.   Cardiovascular: Negative.  Negative for chest pain, palpitations and leg swelling.  Neurological: Negative for dizziness and headaches.  Psychiatric/Behavioral: Negative.      Today's Vitals   03/30/21 0851  BP: 134/80  Pulse: 67  Temp: 97.6 F (36.4 C)  TempSrc: Oral  Weight: (!) 311 lb 6.4 oz (141.3 kg)  Height: 5\' 8"  (1.727 m)  PainSc: 0-No pain   Body mass index is 47.35 kg/m.    Objective:  Physical Exam Vitals reviewed.  Constitutional:      General: He is not in acute distress.    Appearance: Normal appearance. He is obese.  Cardiovascular:     Rate and Rhythm: Normal rate and regular rhythm.     Pulses: Normal pulses.     Heart sounds: Normal heart sounds. No murmur heard.   Pulmonary:     Effort: Pulmonary effort is normal. No respiratory distress.     Breath sounds: Normal breath sounds. No wheezing.  Skin:    Capillary Refill: Capillary refill takes less than 2 seconds.  Neurological:     General: No focal deficit present.     Mental Status: He is alert and oriented to person, place, and time.     Cranial Nerves: No cranial nerve deficit.  Psychiatric:        Mood and Affect: Mood normal.        Behavior: Behavior normal.        Thought Content: Thought content normal.        Judgment: Judgment normal.         Assessment And Plan:     1. Hypertension, unspecified type . B/P is controlled.  . CMP ordered to check renal function.  . The importance of regular exercise and dietary modification was stressed to the patient.   2. Other hyperlipidemia  Chronic, controlled  Continue with current medications, tolerating medications - Lipid panel  3. Prediabetes  Chronic, controlled  Continue with current medications  Encouraged to limit intake of sugary foods and drinks  Encouraged to increase physical activity to 150 minutes per week  Diabetic foot exam done, decreased sensation bilaterally feet - Hemoglobin A1c  4. Class 3 severe obesity due to excess calories without serious comorbidity with body mass index (BMI) of 45.0 to 49.9 in adult [facility])  Chronic  Discussed healthy diet and regular exercise options   Encouraged to exercise at least 150 minutes per week with 2 days of strength training  I have advised him to make sure he is eating at least 3 healthy meals a day.   Continue with [facility].   Return in 2 months for  weight check. - Semaglutide-Weight Management 2.4 MG/0.75ML SOAJ; Inject 2.4 mg into the skin once a week.  Dispense: 3 mL; Refill: 1  5. Seasonal allergies  He is encouraged to get some bee pollen to see if this helps his symptoms  I have sent a Rx for azestaline to see if this helps.  - levocetirizine (XYZAL) 5 MG tablet; Take 1 tablet (5 mg total) by mouth  every evening.  Dispense: 90 tablet; Refill: 1   He will send the date of his Covid Booster  Patient was given opportunity to ask questions. Patient verbalized understanding of the plan and was able to repeat key elements of the plan. All questions were answered to their satisfaction.  [name], FNP   I, [name], FNP, have reviewed all documentation for this visit. The documentation on 03/30/21 for the exam, diagnosis, procedures, and orders are all accurate and complete.   IF YOU HAVE BEEN REFERRED TO A SPECIALIST, IT MAY TAKE 1-2 WEEKS TO SCHEDULE/PROCESS THE REFERRAL. IF YOU HAVE NOT HEARD FROM US/SPECIALIST IN TWO WEEKS, PLEASE GIVE [place] A CALL AT [phone].   THE PATIENT IS ENCOURAGED TO PRACTICE SOCIAL DISTANCING DUE TO THE COVID-19 PANDEMIC.

## 2021-03-30 NOTE — Progress Notes (Signed)
This visit occurred during the SARS-CoV-2 public health emergency.  Safety protocols were in place, including screening questions prior to the visit, additional usage of staff PPE, and extensive cleaning of exam room while observing appropriate contact time as indicated for disinfecting solutions.  Subjective:     Patient ID: Alex Harrison , male    DOB: 05-Nov-1971 , 50 y.o.   MRN: [id number]   Chief Complaint  Patient presents with  . Prediabetes  . Hyperlipidemia    HPI  Alex Harrison presents for a weight check and DM and cholesterol follow-up.  He denies any changes to his health since the last appointment in Jan 2022.  He reports cutting back on sweets.  He is eating mainly baked meats but does report eating rice and pasta often.  He is drinking 64oz of water daily and has started back walking (1.5 miles, 5x/week).  His last lipid panel was normal.  He reports compliance with atorvastatin and that he has occasional cramping in his thighs.  He has lost 5lbs since 01/07/21.  He reports compliance with BDZHGD and denies side effects.   [name] reports multiple episodes of feeling sluggish after eating oranges.  He denies any urticaria or difficulty breathing at the time.  Covid-19 vaccination booster was completed on 11/01/20 at [facility] in [place].  Wt Readings from Last 3 Encounters: 03/30/21 : (!) 311 lb 6.4 oz (141.3 kg) 01/07/21 : (!) 316 lb 3.2 oz (143.4 kg) 10/29/20 : (!) 308 lb 6.4 oz (139.9 kg)    Past Medical History:  Diagnosis Date  . Allergic rhinitis   . Allergy    seasonal allergic rhinitis  . Diabetes mellitus without complication (HCC)   . GERD (gastroesophageal reflux disease)    past hx   . Gout   . Hyperlipidemia   . Hypertension   . Left arm pain   . Left elbow pain   . Macular degeneration    shots every 3 months   . Morbid obesity (HCC)   . Neck nodule 09/16/10   right      Family History  Problem Relation Age of Onset  .  Hypertension Mother   . Diabetes Mother   . Stroke Mother   . Hypertension Father   . Diabetes Father   . Diabetes Sister   . Hypertension Sister   . Colon cancer Neg Hx   . Colon polyps Neg Hx   . Esophageal cancer Neg Hx   . Rectal cancer Neg Hx   . Stomach cancer Neg Hx      Current Outpatient Medications:  .  allopurinol (ZYLOPRIM) 300 MG tablet, Take 1 tablet (300 mg total) by mouth daily., Disp: 90 tablet, Rfl: 1 .  atorvastatin (LIPITOR) 40 MG tablet, Take 1 tablet (40 mg total) by mouth at bedtime., Disp: 90 tablet, Rfl: 1 .  Calcium Carbonate (CALCIUM 600 PO), Take 1 tablet by mouth daily at 12 noon., Disp: , Rfl:  .  Cholecalciferol (VITAMIN D-3) 5000 UNITS TABS, Take by mouth., Disp: , Rfl:  .  fexofenadine (ALLEGRA) 180 MG tablet, Take 180 mg by mouth daily., Disp: , Rfl:  .  hydrocortisone (ANUSOL-HC) 2.5 % rectal cream, Place 1 application rectally 2 (two) times daily., Disp: 30 g, Rfl: 0 .  MAGNESIUM GLUCONATE PO, Take 30 mg by mouth daily., Disp: , Rfl:  .  mometasone (ELOCON) 0.1 % cream, Apply to affected area daily, Disp: 45 g, Rfl: 1 .  mometasone (NASONEX) 50 MCG/ACT  nasal spray, Use 2 spray(s) in each nostril once daily, Disp: 17 g, Rfl: 0 .  Omega-3 Fatty Acids (FISH OIL PO), Take by mouth., Disp: , Rfl:  .  Semaglutide-Weight Management 2.4 MG/0.75ML SOAJ, INJECT 2.4 MG INTO THE SKIN ONCE A WEEK., Disp: 3 mL, Rfl: 0   Allergies  Allergen Reactions  . Niaspan [Niacin Er]     Skin burning and itching and painful to touch.     Review of Systems  Constitutional: Negative.   Respiratory: Negative.   Cardiovascular: Negative.      Today's Vitals   03/30/21 0851  BP: 134/80  Pulse: 67  Temp: 97.6 F (36.4 C)  TempSrc: Oral  Weight: (!) 311 lb 6.4 oz (141.3 kg)  Height: 5\' 8"  (1.727 m)  PainSc: 0-No pain   Body mass index is 47.35 kg/m.   Objective:  Physical Exam Constitutional:      Appearance: He is obese.  Cardiovascular:     Rate and  Rhythm: Normal rate and regular rhythm.     Pulses: Normal pulses.     Heart sounds: Normal heart sounds.  Pulmonary:     Effort: Pulmonary effort is normal.     Breath sounds: Normal breath sounds.  Neurological:     Mental Status: He is alert.  Psychiatric:        Mood and Affect: Mood normal.        Behavior: Behavior normal.         Assessment And Plan:     1. Hypertension, unspecified type  2. Other hyperlipidemia  3. Prediabetes  4. Class 3 severe obesity due to excess calories without serious comorbidity with body mass index (BMI) of 45.0 to 49.9 in adult [facility])     Patient was given opportunity to ask questions. Patient verbalized understanding of the plan and was able to repeat key elements of the plan. All questions were answered to their satisfaction.  [facility], RN   I, Blanca Friend, RN, have reviewed all documentation for this visit. The documentation on 03/30/21 for the exam, diagnosis, procedures, and orders are all accurate and complete.   IF YOU HAVE BEEN REFERRED TO A SPECIALIST, IT MAY TAKE 1-2 WEEKS TO SCHEDULE/PROCESS THE REFERRAL. IF YOU HAVE NOT HEARD FROM US/SPECIALIST IN TWO WEEKS, PLEASE GIVE 04/01/21 A CALL AT [phone].   THE PATIENT IS ENCOURAGED TO PRACTICE SOCIAL DISTANCING DUE TO THE COVID-19 PANDEMIC.

## 2021-03-30 NOTE — Patient Instructions (Signed)
High Cholesterol  High cholesterol is a condition in which the blood has high levels of a white, waxy substance similar to fat (cholesterol). The liver makes all the cholesterol that the body needs. The human body needs small amounts of cholesterol to help build cells. A person gets extra or excess cholesterol from the food that he or she eats. The blood carries cholesterol from the liver to the rest of the body. If you have high cholesterol, deposits (plaques) may build up on the walls of your arteries. Arteries are the blood vessels that carry blood away from your heart. These plaques make the arteries narrow and stiff. Cholesterol plaques increase your risk for heart attack and stroke. Work with your health care provider to keep your cholesterol levels in a healthy range. What increases the risk? The following factors may make you more likely to develop this condition:  Eating foods that are high in animal fat (saturated fat) or cholesterol.  Being overweight.  Not getting enough exercise.  A family history of high cholesterol (familial hypercholesterolemia).  Use of tobacco products.  Having diabetes. What are the signs or symptoms? There are no symptoms of this condition. How is this diagnosed? This condition may be diagnosed based on the results of a blood test.  If you are older than 52 years of age, your health care provider may check your cholesterol levels every 4-6 years.  You may be checked more often if you have high cholesterol or other risk factors for heart disease. The blood test for cholesterol measures:  "Bad" cholesterol, or LDL cholesterol. This is the main type of cholesterol that causes heart disease. The desired level is less than 100 mg/dL.  "Good" cholesterol, or HDL cholesterol. HDL helps protect against heart disease by cleaning the arteries and carrying the LDL to the liver for processing. The desired level for HDL is 60 mg/dL or higher.  Triglycerides.  These are fats that your body can store or burn for energy. The desired level is less than 150 mg/dL.  Total cholesterol. This measures the total amount of cholesterol in your blood and includes LDL, HDL, and triglycerides. The desired level is less than 200 mg/dL. How is this treated? This condition may be treated with:  Diet changes. You may be asked to eat foods that have more fiber and less saturated fats or added sugar.  Lifestyle changes. These may include regular exercise, maintaining a healthy weight, and quitting use of tobacco products.  Medicines. These are given when diet and lifestyle changes have not worked. You may be prescribed a statin medicine to help lower your cholesterol levels. Follow these instructions at home: Eating and drinking  Eat a healthy, balanced diet. This diet includes: ? Daily servings of a variety of fresh, frozen, or canned fruits and vegetables. ? Daily servings of whole grain foods that are rich in fiber. ? Foods that are low in saturated fats and trans fats. These include poultry and fish without skin, lean cuts of meat, and low-fat dairy products. ? A variety of fish, especially oily fish that contain omega-3 fatty acids. Aim to eat fish at least 2 times a week.  Avoid foods and drinks that have added sugar.  Use healthy cooking methods, such as roasting, grilling, broiling, baking, poaching, steaming, and stir-frying. Do not fry your food except for stir-frying.   Lifestyle  Get regular exercise. Aim to exercise for a total of 150 minutes a week. Increase your activity level by doing activities   such as gardening, walking, and taking the stairs.  Do not use any products that contain nicotine or tobacco, such as cigarettes, e-cigarettes, and chewing tobacco. If you need help quitting, ask your health care provider.   General instructions  Take over-the-counter and prescription medicines only as told by your health care provider.  Keep all  follow-up visits as told by your health care provider. This is important. Where to find more information  American Heart Association: www.heart.org  National Heart, Lung, and Blood Institute: www.nhlbi.nih.gov Contact a health care provider if:  You have trouble achieving or maintaining a healthy diet or weight.  You are starting an exercise program.  You are unable to stop smoking. Get help right away if:  You have chest pain.  You have trouble breathing.  You have any symptoms of a stroke. "BE FAST" is an easy way to remember the main warning signs of a stroke: ? B - Balance. Signs are dizziness, sudden trouble walking, or loss of balance. ? E - Eyes. Signs are trouble seeing or a sudden change in vision. ? F - Face. Signs are sudden weakness or numbness of the face, or the face or eyelid drooping on one side. ? A - Arms. Signs are weakness or numbness in an arm. This happens suddenly and usually on one side of the body. ? S - Speech. Signs are sudden trouble speaking, slurred speech, or trouble understanding what people say. ? T - Time. Time to call emergency services. Write down what time symptoms started.  You have other signs of a stroke, such as: ? A sudden, severe headache with no known cause. ? Nausea or vomiting. ? Seizure. These symptoms may represent a serious problem that is an emergency. Do not wait to see if the symptoms will go away. Get medical help right away. Call your local emergency services (911 in the [place] Do not drive yourself to the hospital. Summary  Cholesterol plaques increase your risk for heart attack and stroke. Work with your health care provider to keep your cholesterol levels in a healthy range.  Eat a healthy, balanced diet, get regular exercise, and maintain a healthy weight.  Do not use any products that contain nicotine or tobacco, such as cigarettes, e-cigarettes, and chewing tobacco.  Get help right away if you have any symptoms of a  stroke. This information is not intended to replace advice given to you by your health care provider. Make sure you discuss any questions you have with your health care provider. Document Revised: 10/29/2019 Document Reviewed: 10/29/2019 Elsevier Patient Education  2021 Elsevier Inc.  

## 2021-03-31 LAB — LIPID PANEL
Chol/HDL Ratio: 3.4 ratio (ref 0.0–5.0)
Cholesterol, Total: 111 mg/dL (ref 100–199)
HDL: 33 mg/dL — ABNORMAL LOW (ref >39)
LDL Chol Calc (NIH): 65 mg/dL (ref 0–99)
Triglycerides: 55 mg/dL (ref 0–149)
VLDL Cholesterol Cal: 13 mg/dL (ref 5–40)

## 2021-03-31 LAB — HEMOGLOBIN A1C
Est. average glucose Bld gHb Est-mCnc: 123 mg/dL
Hgb A1c MFr Bld: 5.9 % — ABNORMAL HIGH (ref 4.8–5.6)

## 2021-04-19 ENCOUNTER — Other Ambulatory Visit: Payer: Self-pay | Admitting: Nurse Practitioner

## 2021-04-19 DIAGNOSIS — M109 Gout, unspecified: Secondary | ICD-10-CM

## 2021-04-19 DIAGNOSIS — E7849 Other hyperlipidemia: Secondary | ICD-10-CM

## 2021-04-27 ENCOUNTER — Other Ambulatory Visit (HOSPITAL_COMMUNITY): Payer: Self-pay

## 2021-05-06 ENCOUNTER — Telehealth: Payer: Self-pay

## 2021-05-06 NOTE — Telephone Encounter (Signed)
Returned pt vm. Pt wanted [place] to give him a call.

## 2021-05-24 ENCOUNTER — Other Ambulatory Visit: Payer: Self-pay | Admitting: Nurse Practitioner

## 2021-05-24 DIAGNOSIS — Z6841 Body Mass Index (BMI) 40.0 and over, adult: Secondary | ICD-10-CM

## 2021-05-25 ENCOUNTER — Other Ambulatory Visit (HOSPITAL_COMMUNITY): Payer: Self-pay

## 2021-05-25 MED ORDER — WEGOVY 2.4 MG/0.75ML ~~LOC~~ SOAJ
2.4000 mg | SUBCUTANEOUS | 1 refills | Status: DC
  Filled 2021-05-25 – 2021-05-27 (×2): qty 3, 28d supply, fill #0
  Filled 2021-06-16: qty 3, 28d supply, fill #1

## 2021-05-26 ENCOUNTER — Other Ambulatory Visit (HOSPITAL_COMMUNITY): Payer: Self-pay

## 2021-05-27 ENCOUNTER — Other Ambulatory Visit (HOSPITAL_COMMUNITY): Payer: Self-pay

## 2021-05-27 ENCOUNTER — Telehealth: Payer: Self-pay

## 2021-05-27 NOTE — Telephone Encounter (Signed)
I called patient and notified him that his PA for wegovy has been approved. YL,RMA

## 2021-05-28 ENCOUNTER — Other Ambulatory Visit (HOSPITAL_COMMUNITY): Payer: Self-pay

## 2021-06-02 ENCOUNTER — Encounter: Payer: Self-pay | Admitting: Nurse Practitioner

## 2021-06-02 ENCOUNTER — Ambulatory Visit (INDEPENDENT_AMBULATORY_CARE_PROVIDER_SITE_OTHER): Payer: BC Managed Care – PPO | Admitting: Nurse Practitioner

## 2021-06-02 ENCOUNTER — Other Ambulatory Visit: Payer: Self-pay

## 2021-06-02 VITALS — BP 116/70 | HR 80 | Temp 97.9°F | Ht 68.0 in | Wt 303.2 lb

## 2021-06-02 DIAGNOSIS — Z6841 Body Mass Index (BMI) 40.0 and over, adult: Secondary | ICD-10-CM | POA: Diagnosis not present

## 2021-06-02 DIAGNOSIS — Z139 Encounter for screening, unspecified: Secondary | ICD-10-CM | POA: Diagnosis not present

## 2021-06-02 DIAGNOSIS — R42 Dizziness and giddiness: Secondary | ICD-10-CM

## 2021-06-02 NOTE — Patient Instructions (Addendum)
Exercising to Lose Weight Exercise is structured, repetitive physical activity to improve fitness and health. Getting regular exercise is important for everyone. It is especially important if you are overweight. Being overweight increases your risk of heart disease, stroke, diabetes, high blood pressure, and several types of cancer.Reducing your calorie intake and exercising can help you lose weight. Exercise is usually categorized as moderate or vigorous intensity. To lose weight, most people need to do a certain amount of moderate-intensity orvigorous-intensity exercise each week. Moderate-intensity exercise  Moderate-intensity exercise is any activity that gets you moving enough to burn at least three times more energy (calories) than if you were sitting. Examples of moderate exercise include: Walking a mile in 15 minutes. Doing light yard work. Biking at an easy pace. Most people should get at least 150 minutes (2 hours and 30 minutes) a week ofmoderate-intensity exercise to maintain their body weight. Vigorous-intensity exercise Vigorous-intensity exercise is any activity that gets you moving enough to burn at least six times more calories than if you were sitting. When you exercise at this intensity, you should be working hard enough that you are not able tocarry on a conversation. Examples of vigorous exercise include: Running. Playing a team sport, such as football, basketball, and soccer. Jumping rope. Most people should get at least 75 minutes (1 hour and 15 minutes) a week ofvigorous-intensity exercise to maintain their body weight. How can exercise affect me? When you exercise enough to burn more calories than you eat, you lose weight. Exercise also reduces body fat and builds muscle. The more muscle you have, the more calories you burn. Exercise also: Improves mood. Reduces stress and tension. Improves your overall fitness, flexibility, and endurance. Increases bone strength. The  amount of exercise you need to lose weight depends on: Your age. The type of exercise. Any health conditions you have. Your overall physical ability. Talk to your health care provider about how much exercise you need and whattypes of activities are safe for you. What actions can I take to lose weight? Nutrition  Make changes to your diet as told by your health care provider or diet and nutrition specialist (dietitian). This may include: Eating fewer calories. Eating more protein. Eating less unhealthy fats. Eating a diet that includes fresh fruits and vegetables, whole grains, low-fat dairy products, and lean protein. Avoiding foods with added fat, salt, and sugar. Drink plenty of water while you exercise to prevent dehydration or heat stroke.  Activity Choose an activity that you enjoy and set realistic goals. Your health care provider can help you make an exercise plan that works for you. Exercise at a moderate or vigorous intensity most days of the week. The intensity of exercise may vary from person to person. You can tell how intense a workout is for you by paying attention to your breathing and heartbeat. Most people will notice their breathing and heartbeat get faster with more intense exercise. Do resistance training twice each week, such as: Push-ups. Sit-ups. Lifting weights. Using resistance bands. Getting short amounts of exercise can be just as helpful as long structured periods of exercise. If you have trouble finding time to exercise, try to include exercise in your daily routine. Get up, stretch, and walk around every 30 minutes throughout the day. Go for a walk during your lunch break. Park your car farther away from your destination. If you take public transportation, get off one stop early and walk the rest of the way. Make phone calls while standing up and   walking around. Take the stairs instead of elevators or escalators. Wear comfortable clothes and shoes with  good support. Do not exercise so much that you hurt yourself, feel dizzy, or get very short of breath. Where to find more information [place] Department of Health and Human Services: ThisPath.fi Centers for Disease Control and Prevention (CDC): [employer]Ex[employer].com.br Contact a health care provider: Before starting a new exercise program. If you have questions or concerns about your weight. If you have a medical problem that keeps you from exercising. Get help right away if you have any of the following while exercising: Injury. Dizziness. Difficulty breathing or shortness of breath that does not go away when you stop exercising. Chest pain. Rapid heartbeat. Summary Being overweight increases your risk of heart disease, stroke, diabetes, high blood pressure, and several types of cancer. Losing weight happens when you burn more calories than you eat. Reducing the amount of calories you eat in addition to getting regular moderate or vigorous exercise each week helps you lose weight. This information is not intended to replace advice given to you by your health care provider. Make sure you discuss any questions you have with your healthcare provider. Document Revised: 03/10/2020 Document Reviewed: 03/27/2020 Elsevier Patient Education  2022 [employer].   Drink gatorade or powerade to help replinish electrolytes Also get power crunch bars for more protein/snack

## 2021-06-02 NOTE — Progress Notes (Signed)
I,Yamilka [employer] as a Education administrator for [employer], FNP.,have documented all relevant documentation on the behalf of [name], FNP,as directed by  [name], FNP while in the presence of [name], [place].  This visit occurred during the SARS-CoV-2 public health emergency.  Safety protocols were in place, including screening questions prior to the visit, additional usage of staff PPE, and extensive cleaning of exam room while observing appropriate contact time as indicated for disinfecting solutions.  Subjective:     Patient ID: Alex Harrison , male    DOB: 05-Aug-1971 , 50 y.o.   MRN: [id number]   Chief Complaint  Patient presents with   Weight Check    HPI  Patient presents today with his wife for a weight check. Patient also reports having a dizzy spell about one month ago. After working in the evening after work. His wife stated he works outside and he does drink a lot of water but he is not eating that much and that might be the reason why he felt that.   Wt Readings from Last 3 Encounters: 06/02/21 : (!) 303 lb 3.2 oz (137.5 kg) 03/30/21 : (!) 311 lb 6.4 oz (141.3 kg) 01/07/21 : (!) 316 lb 3.2 oz (143.4 kg)  He is 1/2 walking.       Past Medical History:  Diagnosis Date   Allergic rhinitis    Allergy    seasonal allergic rhinitis   Diabetes mellitus without complication (HCC)    GERD (gastroesophageal reflux disease)    past hx    Gout    Hyperlipidemia    Hypertension    Left arm pain    Left elbow pain    Macular degeneration    shots every 3 months    Morbid obesity (HCC)    Neck nodule 09/16/10   right      Family History  Problem Relation Age of Onset   Hypertension Mother    Diabetes Mother    Stroke Mother    Hypertension Father    Diabetes Father    Diabetes Sister    Hypertension Sister    Colon cancer Neg Hx    Colon polyps Neg Hx    Esophageal cancer Neg Hx    Rectal cancer Neg Hx    Stomach cancer Neg Hx      Current  Outpatient Medications:    allopurinol (ZYLOPRIM) 300 MG tablet, Take 1 tablet by mouth once daily, Disp: 90 tablet, Rfl: 0   atorvastatin (LIPITOR) 40 MG tablet, TAKE 1 TABLET BY MOUTH AT BEDTIME, Disp: 90 tablet, Rfl: 0   azelastine (ASTELIN) 0.1 % nasal spray, Place 2 sprays into both nostrils 2 (two) times daily. Use in each nostril as directed, Disp: 30 mL, Rfl: 5   Calcium Carbonate (CALCIUM 600 PO), Take 1 tablet by mouth daily at 12 noon., Disp: , Rfl:    Cholecalciferol (VITAMIN D-3) 5000 UNITS TABS, Take by mouth., Disp: , Rfl:    hydrocortisone (ANUSOL-HC) 2.5 % rectal cream, Place 1 application rectally 2 (two) times daily., Disp: 30 g, Rfl: 0   levocetirizine (XYZAL) 5 MG tablet, Take 1 tablet (5 mg total) by mouth every evening., Disp: 90 tablet, Rfl: 1   MAGNESIUM GLUCONATE PO, Take 30 mg by mouth daily., Disp: , Rfl:    Omega-3 Fatty Acids (FISH OIL PO), Take by mouth., Disp: , Rfl:    Semaglutide-Weight Management (WEGOVY) 2.4 MG/0.75ML SOAJ, Inject 2.4 mg into the skin once a week., Disp:  3 mL, Rfl: 1   Allergies  Allergen Reactions   Niaspan [Niacin Er]     Skin burning and itching and painful to touch.     Review of Systems  Constitutional: Negative.   Respiratory: Negative.    Cardiovascular: Negative.  Negative for chest pain, palpitations and leg swelling.  Neurological:  Negative for dizziness and headaches.  Psychiatric/Behavioral: Negative.      Today's Vitals   06/02/21 1003  BP: 116/70  Pulse: 80  Temp: 97.9 F (36.6 C)  Weight: (!) 303 lb 3.2 oz (137.5 kg)  Height: 5' 8"  (1.727 m)  PainSc: 0-No pain   Body mass index is 46.1 kg/m.   Objective:  Physical Exam Vitals reviewed.  Constitutional:      General: He is not in acute distress.    Appearance: Normal appearance. He is obese.  Cardiovascular:     Rate and Rhythm: Normal rate and regular rhythm.     Pulses: Normal pulses.     Heart sounds: Normal heart sounds. No murmur heard. Pulmonary:      Effort: Pulmonary effort is normal. No respiratory distress.     Breath sounds: Normal breath sounds. No wheezing.  Skin:    Capillary Refill: Capillary refill takes less than 2 seconds.  Neurological:     General: No focal deficit present.     Mental Status: He is alert and oriented to person, place, and time.     Cranial Nerves: No cranial nerve deficit.  Psychiatric:        Mood and Affect: Mood normal.        Behavior: Behavior normal.        Thought Content: Thought content normal.        Judgment: Judgment normal.        Assessment And Plan:     1. Class 3 severe obesity due to excess calories without serious comorbidity with body mass index (BMI) of 45.0 to 49.9 in adult [facility]) Chronic, has lost 9 lbs since last visit Discussed healthy diet and regular exercise options  Encouraged to exercise at least 150 minutes per week with 2 days of strength training, encouraged to incorporate more physical activity and have a good foundation He is to call when he picks up his refill  Return in 2 months for weight check.  2. Dizziness Likely related to heat exhaustion as he works outside He is to drink more drinks with electrolytes and continue to stay hydrated - BMP8+eGFR  3. Encounter for screening Will check to see if has an immunity to chicken pox to see if he can get the shingles vaccine - Varicella zoster antibody, IgG     Patient was given opportunity to ask questions. Patient verbalized understanding of the plan and was able to repeat key elements of the plan. All questions were answered to their satisfaction.  [name], FNP   I, [name], FNP, have reviewed all documentation for this visit. The documentation on 06/02/21 for the exam, diagnosis, procedures, and orders are all accurate and complete.   IF YOU HAVE BEEN REFERRED TO A SPECIALIST, IT MAY TAKE 1-2 WEEKS TO SCHEDULE/PROCESS THE REFERRAL. IF YOU HAVE NOT HEARD FROM US/SPECIALIST IN TWO WEEKS, PLEASE  GIVE [place] A CALL AT [phone].   THE PATIENT IS ENCOURAGED TO PRACTICE SOCIAL DISTANCING DUE TO THE COVID-19 PANDEMIC.

## 2021-06-03 LAB — BMP8+EGFR
BUN/Creatinine Ratio: 11 (ref 9–20)
BUN: 11 mg/dL (ref 6–24)
CO2: 21 mmol/L (ref 20–29)
Calcium: 9.3 mg/dL (ref 8.7–10.2)
Chloride: 102 mmol/L (ref 96–106)
Creatinine, Ser: 0.99 mg/dL (ref 0.76–1.27)
Glucose: 81 mg/dL (ref 65–99)
Potassium: 4.5 mmol/L (ref 3.5–5.2)
Sodium: 140 mmol/L (ref 134–144)
eGFR: 92 mL/min/{1.73_m2} (ref >59)

## 2021-06-03 LAB — VARICELLA ZOSTER ANTIBODY, IGG: Varicella zoster IgG: 619 index (ref >165)

## 2021-06-08 ENCOUNTER — Other Ambulatory Visit: Payer: Self-pay

## 2021-06-08 DIAGNOSIS — K649 Unspecified hemorrhoids: Secondary | ICD-10-CM

## 2021-06-09 ENCOUNTER — Encounter: Payer: Self-pay | Admitting: Nurse Practitioner

## 2021-06-09 MED ORDER — HYDROCORTISONE (PERIANAL) 2.5 % EX CREA: 1.0000 "application " | TOPICAL_CREAM | Freq: Two times a day (BID) | CUTANEOUS | 0 refills | Status: DC

## 2021-06-16 ENCOUNTER — Other Ambulatory Visit (HOSPITAL_COMMUNITY): Payer: Self-pay

## 2021-06-17 ENCOUNTER — Other Ambulatory Visit (HOSPITAL_COMMUNITY): Payer: Self-pay

## 2021-06-21 ENCOUNTER — Encounter: Payer: Self-pay | Admitting: Nurse Practitioner

## 2021-06-23 ENCOUNTER — Other Ambulatory Visit: Payer: Self-pay | Admitting: Nurse Practitioner

## 2021-06-23 DIAGNOSIS — R42 Dizziness and giddiness: Secondary | ICD-10-CM

## 2021-06-23 NOTE — Progress Notes (Signed)
Ordered CT scan of brain due to more frequent episodes of dizziness, now with blurred vision

## 2021-06-24 ENCOUNTER — Encounter: Payer: Self-pay | Admitting: Nurse Practitioner

## 2021-06-24 ENCOUNTER — Other Ambulatory Visit: Payer: Self-pay | Admitting: Nurse Practitioner

## 2021-06-24 DIAGNOSIS — Z23 Encounter for immunization: Secondary | ICD-10-CM

## 2021-06-24 MED ORDER — SHINGRIX 50 MCG/0.5ML IM SUSR: 0.5000 mL | Freq: Once | INTRAMUSCULAR | 0 refills | Status: AC

## 2021-07-01 ENCOUNTER — Other Ambulatory Visit: Payer: Self-pay | Admitting: Nurse Practitioner

## 2021-07-01 DIAGNOSIS — R42 Dizziness and giddiness: Secondary | ICD-10-CM

## 2021-07-05 ENCOUNTER — Other Ambulatory Visit: Payer: Self-pay | Admitting: Nurse Practitioner

## 2021-07-05 DIAGNOSIS — Z6841 Body Mass Index (BMI) 40.0 and over, adult: Secondary | ICD-10-CM

## 2021-07-06 ENCOUNTER — Other Ambulatory Visit (HOSPITAL_COMMUNITY): Payer: Self-pay

## 2021-07-06 ENCOUNTER — Other Ambulatory Visit: Payer: Self-pay | Admitting: Nurse Practitioner

## 2021-07-06 MED ORDER — WEGOVY 2.4 MG/0.75ML ~~LOC~~ SOAJ
2.4000 mg | SUBCUTANEOUS | 1 refills | Status: DC
  Filled 2021-07-06 – 2021-07-13 (×2): qty 3, 28d supply, fill #0
  Filled 2021-08-18: qty 3, 28d supply, fill #1

## 2021-07-08 ENCOUNTER — Other Ambulatory Visit: Payer: BC Managed Care – PPO

## 2021-07-14 ENCOUNTER — Other Ambulatory Visit (HOSPITAL_COMMUNITY): Payer: Self-pay

## 2021-07-29 ENCOUNTER — Encounter: Payer: Self-pay | Admitting: Neurology

## 2021-07-29 ENCOUNTER — Ambulatory Visit: Payer: BC Managed Care – PPO | Admitting: Neurology

## 2021-07-29 VITALS — BP 115/80 | HR 73 | Ht 67.0 in | Wt 299.0 lb

## 2021-07-29 DIAGNOSIS — H8190 Unspecified disorder of vestibular function, unspecified ear: Secondary | ICD-10-CM

## 2021-07-29 DIAGNOSIS — R519 Headache, unspecified: Secondary | ICD-10-CM | POA: Diagnosis not present

## 2021-07-29 DIAGNOSIS — H9191 Unspecified hearing loss, right ear: Secondary | ICD-10-CM

## 2021-07-29 DIAGNOSIS — R0683 Snoring: Secondary | ICD-10-CM

## 2021-07-29 DIAGNOSIS — R42 Dizziness and giddiness: Secondary | ICD-10-CM

## 2021-07-29 DIAGNOSIS — Z6841 Body Mass Index (BMI) 40.0 and over, adult: Secondary | ICD-10-CM

## 2021-07-29 DIAGNOSIS — H81399 Other peripheral vertigo, unspecified ear: Secondary | ICD-10-CM

## 2021-07-29 NOTE — Progress Notes (Signed)
Subjective:    Patient ID: Alex Harrison is a 50 y.o. male.  HPI    Alex FoleySaima Myliyah Rebuck, MD, PhD [facility] [address], [address] [address] [place], [place] [id number]  Dear Alex Harrison,   I saw your patient, Alex Harrison, upon your kind request in my neurologic clinic today for initial consultation of his recurrent dizziness.  Patient is accompanied by his wife today.  As you know, Alex Harrison is a 50 year old right-handed gentleman with an underlying medical history of allergic rhinitis, hypertension, hyperlipidemia, macular degeneration, diabetes, gout, reflux disease, and morbid obesity with a BMI of over 45, who reports several episodes of dizziness that lasted for few hours at a time.  His first episode was in June.  He felt a spinning sensation and had nausea and vomiting.  He had fallen.  He had another episode recently.  He has not had any severe headaches but has woken up with a pressure sensation, he had not pursued sleep testing in the past due to insurance not covering the sleep study.  I had evaluated him about 2 years ago for sleep apnea concern.  He did not pursue testing for that reason. He snores noticeably.  He typically hydrates well with water.  He had seen ENT in the past for right-sided hearing loss.  He currently does not have any spinning sensation or ringing in the ears.  He denies any sudden onset of one-sided weakness or numbness or tingling or droopy face or slurring of speech.  He was supposed to have a head CT but insurance denied it as I understand.  Previously:   07/03/19: 50 year old right-handed gentleman with an underlying medical history of allergies, diabetes, hypertension, hyperlipidemia, gout, and morbid obesity with a BMI of over 40, who reports snoring and excessive daytime somnolence.  I reviewed your office note from 06/25/2019. His Epworth sleepiness score is 7 out of 24, fatigue severity score is 9 out of 63.  He reports a tendency to doze  off when he is sedentary, typically after work when he watches TV at home.  He lives with his wife, they have no pets, he has 1 grown daughter and step grandchildren.  He drinks caffeine occasionally in the form of tea, no daily coffee or soda or tea.  He is a non-smoker and does not utilize alcohol on a regular basis.  They have a TV in the bedroom but he does not typically watch it at night.  He goes to bed around 10 and rise time is 5:15 AM, he has nocturia about once per average night.  He works for the [employer] in [employer], [employer].He has been trying to lose weight.  He has been on Saxenda.  He also sees a nutritionist about every 6 months.  His Past Medical History Is Significant For: Past Medical History:  Diagnosis Date   Allergic rhinitis    Allergy    seasonal allergic rhinitis   Diabetes mellitus without complication (HCC)    Dizziness    GERD (gastroesophageal reflux disease)    past hx    Gout    Hyperlipidemia    Hypertension    Left arm pain    Left elbow pain    Macular degeneration    shots every 3 months    Morbid obesity (HCC)    Neck nodule 09/16/2010   right     His Past Surgical History Is Significant For: Past Surgical History:  Procedure Laterality Date  NO PAST SURGERIES      His Family History Is Significant For: Family History  Problem Relation Age of Onset   Hypertension Mother    Diabetes Mother    Stroke Mother    Hypertension Father    Diabetes Father    Diabetes Sister    Hypertension Sister    Colon cancer Neg Hx    Colon polyps Neg Hx    Esophageal cancer Neg Hx    Rectal cancer Neg Hx    Stomach cancer Neg Hx     His Social History Is Significant For: Social History   Socioeconomic History   Marital status: Married    Spouse name: Not on file   Number of children: 1   Years of education: Not on file   Highest education level: Not on file  Occupational History   Not on file  Tobacco Use   Smoking status:  Never   Smokeless tobacco: Never  Substance and Sexual Activity   Alcohol use: No   Drug use: No   Sexual activity: Not on file  Other Topics Concern   Not on file  Social History Narrative   Not on file   Social Determinants of Health   Financial Resource Strain: Not on file  Food Insecurity: Not on file  Transportation Needs: Not on file  Physical Activity: Not on file  Stress: Not on file  Social Connections: Not on file    His Allergies Are:  Allergies  Allergen Reactions   Niaspan [Niacin Er]     Skin burning and itching and painful to touch.  :   His Current Medications Are:  Outpatient Encounter Medications as of 07/29/2021  Medication Sig   allopurinol (ZYLOPRIM) 300 MG tablet Take 1 tablet by mouth once daily   atorvastatin (LIPITOR) 40 MG tablet TAKE 1 TABLET BY MOUTH AT BEDTIME   azelastine (ASTELIN) 0.1 % nasal spray Place 2 sprays into both nostrils 2 (two) times daily. Use in each nostril as directed   Calcium Carbonate (CALCIUM 600 PO) Take 1 tablet by mouth daily at 12 noon.   Cholecalciferol (VITAMIN D-3) 5000 UNITS TABS Take by mouth.   hydrocortisone (ANUSOL-HC) 2.5 % rectal cream Place 1 application rectally 2 (two) times daily.   levocetirizine (XYZAL) 5 MG tablet Take 1 tablet (5 mg total) by mouth every evening.   MAGNESIUM GLUCONATE PO Take 30 mg by mouth daily.   Omega-3 Fatty Acids (FISH OIL PO) Take by mouth.   Semaglutide-Weight Management (WEGOVY) 2.4 MG/0.75ML SOAJ Inject 2.4 mg into the skin once a week.   No facility-administered encounter medications on file as of 07/29/2021.  :  Review of Systems:  Out of a complete 14 point review of systems, all are reviewed and negative with the exception of these symptoms as listed below:   Review of Systems  Neurological:        Pt is here dizziness . Pt states at home he felt dizziness and vomited. Pt stated fell at home trying to go to the bathroom because he felt dizziness. Pt states the  episodes last about 3 hours . Pt states the PCP Ordered a  CT has not gotten it because of insurance denied it .    Objective:  Neurological Exam  Physical Exam Physical Examination:   Vitals:   07/29/21 1452  BP: 115/80  Pulse: 73    General Examination: The patient is a very pleasant 50 y.o. male in no acute distress. He  appears well-developed and well-nourished and well groomed.  Denies vertiginous symptoms currently.  On orthostatic testing, he has no significant blood pressure drop or symptoms.  Standing blood pressure was 114/83 with a pulse of 89, standing at 3 minutes was 114/83 with pulse of 96.  HEENT: Normocephalic, atraumatic, pupils are equal, round and reactive to light and accommodation. Extraocular tracking is good without limitation to gaze excursion or nystagmus noted. Normal smooth pursuit is noted. Hearing is grossly intact.  Tympanic membranes are clear bilaterally.  Face is symmetric with normal facial animation and normal facial sensation. Speech is clear with no dysarthria noted. There is no hypophonia. There is no lip, neck/head, jaw or voice tremor. Neck is supple with full range of passive and active motion. There are no carotid bruits on auscultation. Oropharynx exam reveals: mild mouth dryness, adequate dental hygiene and moderate airway crowding, due to Larger/longer uvula, tonsils of about 2+. Mallampati is class II, tongue protrudes centrally in palate elevates symmetrically.  He has a moderate underbite.On nasal inspection, he has a slightly deviated septum to the left.   Chest: Clear to auscultation without wheezing, rhonchi or crackles noted.   Heart: S1+S2+0, regular and normal without murmurs, rubs or gallops noted.    Abdomen: Soft, non-tender and non-distended with normal bowel sounds appreciated on auscultation.   Extremities: There is no pitting edema in the distal lower extremities bilaterally.   Skin: Warm and dry without trophic changes noted.    Musculoskeletal: exam reveals no obvious joint deformities, tenderness or joint swelling or erythema.    Neurologically:  Mental status: The patient is awake, alert and oriented in all 4 spheres. His immediate and remote memory, attention, language skills and fund of knowledge are appropriate. There is no evidence of aphasia, agnosia, apraxia or anomia. Speech is clear with normal prosody and enunciation. Thought process is linear. Mood is normal and affect is normal.  Cranial nerves II - XII are as described above under HEENT exam. In addition: shoulder shrug is normal with equal shoulder height noted. Motor exam: Normal bulk, strength and tone is noted. There is no drift, tremor or rebound. Romberg is negative. Reflexes are 2+ throughout.  Toes are downgoing bilaterally.  Fine motor skills and coordination: intact with normal finger taps, normal hand movements, normal rapid alternating patting, normal foot taps and normal foot agility.  Cerebellar testing: No dysmetria or intention tremor on finger to nose testing. Heel to shin is unremarkable bilaterally. There is no truncal or gait ataxia.  Sensory exam: intact to light touch in the upper and lower extremities.  Gait, station and balance: He stands easily. No veering to one side is noted. No leaning to one side is noted. Posture is age-appropriate and stance is narrow based. Gait shows normal stride length and normal pace. No problems turning are noted. Tandem walk is unremarkable, after initial challenge, better without shoes.   Assessment and Plan:    In summary, Alex Harrison is a 50 year old male with an underlying medical history of allergies, diabetes, hypertension, hyperlipidemia, gout, and morbid obesity with a BMI of over 40, who presents for evaluation of his intermittent dizzy spells.  He has had bouts of what sounds like peripheral vertigo.  He has had no strokelike symptoms.  He does continue to have symptoms concerning for underlying  obstructive sleep apnea.  Some 2 years ago when he was evaluated for sleep apnea, he was not able to pursue sleep testing but would be willing to pursue  it at this time.  His exam does not show any orthostatic hypotension or vertiginous findings.  He is largely reassured but at the same time I would like to exclude a structural cause of his symptoms.  He is advised to proceed with a brain MRI with and without contrast.  He is furthermore advised to talk to you about a referral to ENT.  He has seen an ENT specialist in the past for hearing loss.  He is furthermore advised to talk to about a referral to physical therapy for vestibular rehab should he have another bout of vertigo.  He does not sound like he is having migraines but he has woken up with a dull headache.  Headaches may tie in with the possibility of underlying obstructive sleep apnea.  He is advised to proceed with a sleep study and brain MRI and we will keep him posted as to his results by phone call.  We talked about the importance of healthy lifestyle, weight management and good hydration with water.  We will plan a follow-up after his sleep study.  I answered all the questions today and the patient and his wife are in agreement.   Thank you very much for allowing me to participate in the care of this nice patient. If I can be of any further assistance to you please do not hesitate to call me at [phone].   Sincerely,     Alex Foley, MD, PhD

## 2021-07-29 NOTE — Patient Instructions (Signed)
It was nice to see you again today. I believe you have had bouts of vertigo.  If you have another flareup, please talk to your primary care about a referral to physical therapy. I would also recommend that you get evaluated by an ear, nose, and throat specialist (ENT), if you need a referral, please talk to your primary care nurse practitioner about a referral, you have seen an ENT in the past. We will do a brain scan, called MRI and call you with the test results. We will have to schedule you for this on a separate date. This test requires authorization from your insurance, and we will take care of the insurance process. I suspect that you have obstructive sleep apnea.  I will reorder a sleep study and we will talk with your insurance about authorization and call you to schedule your sleep study.  We will plan a follow-up after your MRI and sleep test.

## 2021-08-03 ENCOUNTER — Other Ambulatory Visit: Payer: Self-pay

## 2021-08-03 ENCOUNTER — Ambulatory Visit: Payer: BC Managed Care – PPO | Admitting: Nurse Practitioner

## 2021-08-03 ENCOUNTER — Telehealth: Payer: Self-pay | Admitting: Neurology

## 2021-08-03 ENCOUNTER — Encounter: Payer: Self-pay | Admitting: Nurse Practitioner

## 2021-08-03 VITALS — BP 118/76 | HR 84 | Temp 97.8°F | Ht 67.0 in | Wt 303.4 lb

## 2021-08-03 DIAGNOSIS — Z6841 Body Mass Index (BMI) 40.0 and over, adult: Secondary | ICD-10-CM

## 2021-08-03 DIAGNOSIS — R42 Dizziness and giddiness: Secondary | ICD-10-CM | POA: Diagnosis not present

## 2021-08-03 DIAGNOSIS — R519 Headache, unspecified: Secondary | ICD-10-CM

## 2021-08-03 NOTE — Patient Instructions (Signed)
Exercising to Lose Weight Exercise is structured, repetitive physical activity to improve fitness and health. Getting regular exercise is important for everyone. It is especially important if you are overweight. Being overweight increases your risk of heart disease, stroke, diabetes, high blood pressure, and several types of cancer.Reducing your calorie intake and exercising can help you lose weight. Exercise is usually categorized as moderate or vigorous intensity. To lose weight, most people need to do a certain amount of moderate-intensity orvigorous-intensity exercise each week. Moderate-intensity exercise  Moderate-intensity exercise is any activity that gets you moving enough to burn at least three times more energy (calories) than if you were sitting. Examples of moderate exercise include: Walking a mile in 15 minutes. Doing light yard work. Biking at an easy pace. Most people should get at least 150 minutes (2 hours and 30 minutes) a week ofmoderate-intensity exercise to maintain their body weight. Vigorous-intensity exercise Vigorous-intensity exercise is any activity that gets you moving enough to burn at least six times more calories than if you were sitting. When you exercise at this intensity, you should be working hard enough that you are not able tocarry on a conversation. Examples of vigorous exercise include: Running. Playing a team sport, such as football, basketball, and soccer. Jumping rope. Most people should get at least 75 minutes (1 hour and 15 minutes) a week ofvigorous-intensity exercise to maintain their body weight. How can exercise affect me? When you exercise enough to burn more calories than you eat, you lose weight. Exercise also reduces body fat and builds muscle. The more muscle you have, the more calories you burn. Exercise also: Improves mood. Reduces stress and tension. Improves your overall fitness, flexibility, and endurance. Increases bone strength. The  amount of exercise you need to lose weight depends on: Your age. The type of exercise. Any health conditions you have. Your overall physical ability. Talk to your health care provider about how much exercise you need and whattypes of activities are safe for you. What actions can I take to lose weight? Nutrition  Make changes to your diet as told by your health care provider or diet and nutrition specialist (dietitian). This may include: Eating fewer calories. Eating more protein. Eating less unhealthy fats. Eating a diet that includes fresh fruits and vegetables, whole grains, low-fat dairy products, and lean protein. Avoiding foods with added fat, salt, and sugar. Drink plenty of water while you exercise to prevent dehydration or heat stroke.  Activity Choose an activity that you enjoy and set realistic goals. Your health care provider can help you make an exercise plan that works for you. Exercise at a moderate or vigorous intensity most days of the week. The intensity of exercise may vary from person to person. You can tell how intense a workout is for you by paying attention to your breathing and heartbeat. Most people will notice their breathing and heartbeat get faster with more intense exercise. Do resistance training twice each week, such as: Push-ups. Sit-ups. Lifting weights. Using resistance bands. Getting short amounts of exercise can be just as helpful as long structured periods of exercise. If you have trouble finding time to exercise, try to include exercise in your daily routine. Get up, stretch, and walk around every 30 minutes throughout the day. Go for a walk during your lunch break. Park your car farther away from your destination. If you take public transportation, get off one stop early and walk the rest of the way. Make phone calls while standing up and   walking around. Take the stairs instead of elevators or escalators. Wear comfortable clothes and shoes with  good support. Do not exercise so much that you hurt yourself, feel dizzy, or get very short of breath. Where to find more information [place] Department of Health and Human Services: www.hhs.gov Centers for Disease Control and Prevention (CDC): www.cdc.gov Contact a health care provider: Before starting a new exercise program. If you have questions or concerns about your weight. If you have a medical problem that keeps you from exercising. Get help right away if you have any of the following while exercising: Injury. Dizziness. Difficulty breathing or shortness of breath that does not go away when you stop exercising. Chest pain. Rapid heartbeat. Summary Being overweight increases your risk of heart disease, stroke, diabetes, high blood pressure, and several types of cancer. Losing weight happens when you burn more calories than you eat. Reducing the amount of calories you eat in addition to getting regular moderate or vigorous exercise each week helps you lose weight. This information is not intended to replace advice given to you by your health care provider. Make sure you discuss any questions you have with your healthcare provider. Document Revised: 03/10/2020 Document Reviewed: 03/27/2020 Elsevier Patient Education  2022 [employer]  

## 2021-08-03 NOTE — Progress Notes (Signed)
I,Katawbba Wiggins,acting as a Neurosurgeon for [employer], FNP.,have documented all relevant documentation on the behalf of [name], FNP,as directed by  [name], FNP while in the presence of [name], FNP.  This visit occurred during the SARS-CoV-2 public health emergency.  Safety protocols were in place, including screening questions prior to the visit, additional usage of staff PPE, and extensive cleaning of exam room while observing appropriate contact time as indicated for disinfecting solutions.  Subjective:     Patient ID: Alex Harrison , male    DOB: 11/04/1971 , 50 y.o.   MRN: [id number]   Chief Complaint  Patient presents with   Obesity    HPI  The patient is here today for a follow-up on his weight. Continues with Wegovy at 2.4. Denies nausea . Not currently walking regularly.  He reports he was doing good until he started taking prednisone. He typically weighs without his clothes on. He was 299 a few days ago.  He is currently on Prednisone for dizziness and has been eating more.   He had to come home from work due to the dizziness and went to ER.  He had a CTA of head on 8/17 - which was normal at [facility] in [place].  He was also given meclizine to help as needed.   He has also seen an ENT in the past for hearing loss.    Wt Readings from Last 3 Encounters:  08/03/21 (!) 303 lb 6.4 oz (137.6 kg)  07/29/21 299 lb (135.6 kg)  06/02/21 (!) 303 lb 3.2 oz (137.5 kg)    BP Readings from Last 3 Encounters:  08/03/21 118/76  07/29/21 115/80  06/02/21 116/70     Past Medical History:  Diagnosis Date   Allergic rhinitis    Allergy    seasonal allergic rhinitis   Diabetes mellitus without complication (HCC)    Dizziness    GERD (gastroesophageal reflux disease)    past hx    Gout    Hyperlipidemia    Hypertension    Left arm pain    Left elbow pain    Macular degeneration    shots every 3 months    Morbid obesity (HCC)    Neck nodule 09/16/2010   right       Family History  Problem Relation Age of Onset   Hypertension Mother    Diabetes Mother    Stroke Mother    Hypertension Father    Diabetes Father    Diabetes Sister    Hypertension Sister    Colon cancer Neg Hx    Colon polyps Neg Hx    Esophageal cancer Neg Hx    Rectal cancer Neg Hx    Stomach cancer Neg Hx      Current Outpatient Medications:    allopurinol (ZYLOPRIM) 300 MG tablet, Take 1 tablet by mouth once daily, Disp: 90 tablet, Rfl: 0   atorvastatin (LIPITOR) 40 MG tablet, TAKE 1 TABLET BY MOUTH AT BEDTIME, Disp: 90 tablet, Rfl: 0   azelastine (ASTELIN) 0.1 % nasal spray, Place 2 sprays into both nostrils 2 (two) times daily. Use in each nostril as directed, Disp: 30 mL, Rfl: 5   Calcium Carbonate (CALCIUM 600 PO), Take 1 tablet by mouth daily at 12 noon., Disp: , Rfl:    Cholecalciferol (VITAMIN D-3) 5000 UNITS TABS, Take by mouth., Disp: , Rfl:    hydrocortisone (ANUSOL-HC) 2.5 % rectal cream, Place 1 application rectally 2 (two) times daily., Disp: 30 g,  Rfl: 0   levocetirizine (XYZAL) 5 MG tablet, Take 1 tablet (5 mg total) by mouth every evening., Disp: 90 tablet, Rfl: 1   MAGNESIUM GLUCONATE PO, Take 400 mg by mouth daily., Disp: , Rfl:    meclizine (ANTIVERT) 25 MG tablet, Take 25 mg by mouth 3 (three) times daily as needed., Disp: , Rfl:    methylPREDNISolone (MEDROL DOSEPAK) 4 MG TBPK tablet, See admin instructions., Disp: , Rfl:    Omega-3 Fatty Acids (FISH OIL PO), Take by mouth., Disp: , Rfl:    ondansetron (ZOFRAN-ODT) 4 MG disintegrating tablet, Take by mouth., Disp: , Rfl:    Semaglutide-Weight Management (WEGOVY) 2.4 MG/0.75ML SOAJ, Inject 2.4 mg into the skin once a week., Disp: 3 mL, Rfl: 1   Allergies  Allergen Reactions   Niaspan [Niacin Er]     Skin burning and itching and painful to touch.     Review of Systems  Constitutional: Negative.  Negative for fatigue.  Respiratory: Negative.  Negative for shortness of breath and wheezing.    Cardiovascular: Negative.  Negative for chest pain, palpitations and leg swelling.  Neurological:  Negative for dizziness and headaches.  Psychiatric/Behavioral: Negative.      Today's Vitals   08/03/21 0940  BP: 118/76  Pulse: 84  Temp: 97.8 F (36.6 C)  TempSrc: Oral  Weight: (!) 303 lb 6.4 oz (137.6 kg)  Height: 5\' 7"  (1.702 m)   Body mass index is 47.52 kg/m.   Objective:  Physical Exam Vitals reviewed.  Constitutional:      General: He is not in acute distress.    Appearance: Normal appearance. He is obese.  Cardiovascular:     Rate and Rhythm: Normal rate and regular rhythm.     Pulses: Normal pulses.     Heart sounds: Normal heart sounds. No murmur heard. Pulmonary:     Effort: Pulmonary effort is normal. No respiratory distress.     Breath sounds: Normal breath sounds. No wheezing.  Neurological:     General: No focal deficit present.     Mental Status: He is alert and oriented to person, place, and time.     Cranial Nerves: No cranial nerve deficit.     Motor: No weakness.  Psychiatric:        Mood and Affect: Mood normal.        Behavior: Behavior normal.        Thought Content: Thought content normal.        Judgment: Judgment normal.        Assessment And Plan:     1. Class 3 severe obesity due to excess calories without serious comorbidity with body mass index (BMI) of 45.0 to 49.9 in adult [facility]) Chronic Discussed healthy diet and regular exercise options  Encouraged to exercise at least 150 minutes per week with 2 days of strength training He is encouraged to initially strive for BMI less than 30 to decrease cardiac risk.  Encouraged even though on steroids to attempt to choose healthier food options when hungry. 2. Dizziness Continue meclizine as needed Await appt for MRI of brain ordered by Dr [facility] - Ambulatory referral to ENT    Patient was given opportunity to ask questions. Patient verbalized understanding of the plan and was able to  repeat key elements of the plan. All questions were answered to their satisfaction.  Frances Furbish, FNP   I, [name], FNP, have reviewed all documentation for this visit. The documentation on 08/03/21 for the exam,  diagnosis, procedures, and orders are all accurate and complete.   IF YOU HAVE BEEN REFERRED TO A SPECIALIST, IT MAY TAKE 1-2 WEEKS TO SCHEDULE/PROCESS THE REFERRAL. IF YOU HAVE NOT HEARD FROM US/SPECIALIST IN TWO WEEKS, PLEASE GIVE [place] A CALL AT [phone].   THE PATIENT IS ENCOURAGED TO PRACTICE SOCIAL DISTANCING DUE TO THE COVID-19 PANDEMIC.

## 2021-08-03 NOTE — Telephone Encounter (Signed)
MR Brain w/wo contrast Dr. Alinda Sierras Berkley Harvey: [id number] (exp. 07/30/21 to 08/28/21). Patient is scheduled at [facility] for 08/04/21.

## 2021-08-04 ENCOUNTER — Ambulatory Visit: Payer: BC Managed Care – PPO

## 2021-08-04 DIAGNOSIS — R519 Headache, unspecified: Secondary | ICD-10-CM | POA: Diagnosis not present

## 2021-08-04 DIAGNOSIS — R42 Dizziness and giddiness: Secondary | ICD-10-CM | POA: Diagnosis not present

## 2021-08-04 DIAGNOSIS — H81399 Other peripheral vertigo, unspecified ear: Secondary | ICD-10-CM

## 2021-08-04 DIAGNOSIS — H9191 Unspecified hearing loss, right ear: Secondary | ICD-10-CM | POA: Diagnosis not present

## 2021-08-04 DIAGNOSIS — R0683 Snoring: Secondary | ICD-10-CM

## 2021-08-04 DIAGNOSIS — H8190 Unspecified disorder of vestibular function, unspecified ear: Secondary | ICD-10-CM

## 2021-08-04 MED ORDER — GADOBENATE DIMEGLUMINE 529 MG/ML IV SOLN
20.0000 mL | Freq: Once | INTRAVENOUS | Status: AC | PRN
  Administered 2021-08-04: 20 mL via INTRAVENOUS

## 2021-08-11 ENCOUNTER — Other Ambulatory Visit: Payer: Self-pay | Admitting: Nurse Practitioner

## 2021-08-11 DIAGNOSIS — M109 Gout, unspecified: Secondary | ICD-10-CM

## 2021-08-11 DIAGNOSIS — E7849 Other hyperlipidemia: Secondary | ICD-10-CM

## 2021-08-18 ENCOUNTER — Other Ambulatory Visit (HOSPITAL_COMMUNITY): Payer: Self-pay

## 2021-09-15 ENCOUNTER — Other Ambulatory Visit: Payer: Self-pay | Admitting: Nurse Practitioner

## 2021-09-15 ENCOUNTER — Other Ambulatory Visit (HOSPITAL_COMMUNITY): Payer: Self-pay

## 2021-09-15 MED ORDER — WEGOVY 2.4 MG/0.75ML ~~LOC~~ SOAJ
2.4000 mg | SUBCUTANEOUS | 1 refills | Status: DC
  Filled 2021-09-15: qty 3, 28d supply, fill #0
  Filled 2021-10-13: qty 3, 28d supply, fill #1

## 2021-09-28 DIAGNOSIS — H9311 Tinnitus, right ear: Secondary | ICD-10-CM | POA: Insufficient documentation

## 2021-09-28 DIAGNOSIS — H903 Sensorineural hearing loss, bilateral: Secondary | ICD-10-CM | POA: Insufficient documentation

## 2021-09-28 DIAGNOSIS — R42 Dizziness and giddiness: Secondary | ICD-10-CM | POA: Insufficient documentation

## 2021-10-05 ENCOUNTER — Encounter: Payer: Self-pay | Admitting: Nurse Practitioner

## 2021-10-05 ENCOUNTER — Ambulatory Visit (INDEPENDENT_AMBULATORY_CARE_PROVIDER_SITE_OTHER): Payer: BC Managed Care – PPO | Admitting: Nurse Practitioner

## 2021-10-05 ENCOUNTER — Other Ambulatory Visit: Payer: Self-pay

## 2021-10-05 VITALS — BP 120/78 | HR 80 | Temp 98.3°F | Ht 68.4 in | Wt 298.2 lb

## 2021-10-05 DIAGNOSIS — R7303 Prediabetes: Secondary | ICD-10-CM

## 2021-10-05 DIAGNOSIS — M109 Gout, unspecified: Secondary | ICD-10-CM

## 2021-10-05 DIAGNOSIS — Z0000 Encounter for general adult medical examination without abnormal findings: Secondary | ICD-10-CM | POA: Diagnosis not present

## 2021-10-05 DIAGNOSIS — Z23 Encounter for immunization: Secondary | ICD-10-CM

## 2021-10-05 DIAGNOSIS — I10 Essential (primary) hypertension: Secondary | ICD-10-CM

## 2021-10-05 DIAGNOSIS — E782 Mixed hyperlipidemia: Secondary | ICD-10-CM

## 2021-10-05 DIAGNOSIS — Z6841 Body Mass Index (BMI) 40.0 and over, adult: Secondary | ICD-10-CM

## 2021-10-05 DIAGNOSIS — Z79899 Other long term (current) drug therapy: Secondary | ICD-10-CM

## 2021-10-05 DIAGNOSIS — Z125 Encounter for screening for malignant neoplasm of prostate: Secondary | ICD-10-CM

## 2021-10-05 DIAGNOSIS — E559 Vitamin D deficiency, unspecified: Secondary | ICD-10-CM

## 2021-10-05 LAB — POCT URINALYSIS DIPSTICK
Bilirubin, UA: NEGATIVE
Blood, UA: NEGATIVE
Glucose, UA: NEGATIVE
Ketones, UA: NEGATIVE
Leukocytes, UA: NEGATIVE
Nitrite, UA: NEGATIVE
Protein, UA: NEGATIVE
Spec Grav, UA: 1.02 (ref 1.010–1.025)
Urobilinogen, UA: 0.2 E.U./dL
pH, UA: 5.5 (ref 5.0–8.0)

## 2021-10-05 LAB — POCT UA - MICROALBUMIN
Albumin/Creatinine Ratio, Urine, POC: <30
Creatinine, POC: 300 mg/dL
Microalbumin Ur, POC: 10 mg/L

## 2021-10-05 NOTE — Patient Instructions (Signed)
Health Maintenance, Male Adopting a healthy lifestyle and getting preventive care are important in promoting health and wellness. Ask your health care provider about: The right schedule for you to have regular tests and exams. Things you can do on your own to prevent diseases and keep yourself healthy. What should I know about diet, weight, and exercise? Eat a healthy diet  Eat a diet that includes plenty of vegetables, fruits, low-fat dairy products, and lean protein. Do not eat a lot of foods that are high in solid fats, added sugars, or sodium. Maintain a healthy weight Body mass index (BMI) is a measurement that can be used to identify possible weight problems. It estimates body fat based on height and weight. Your health care provider can help determine your BMI and help you achieve or maintain a healthy weight. Get regular exercise Get regular exercise. This is one of the most important things you can do for your health. Most adults should: Exercise for at least 150 minutes each week. The exercise should increase your heart rate and make you sweat (moderate-intensity exercise). Do strengthening exercises at least twice a week. This is in addition to the moderate-intensity exercise. Spend less time sitting. Even light physical activity can be beneficial. Watch cholesterol and blood lipids Have your blood tested for lipids and cholesterol at 52 years of age, then have this test every 5 years. You may need to have your cholesterol levels checked more often if: Your lipid or cholesterol levels are high. You are older than 52 years of age. You are at high risk for heart disease. What should I know about cancer screening? Many types of cancers can be detected early and may often be prevented. Depending on your health history and family history, you may need to have cancer screening at various ages. This may include screening for: Colorectal cancer. Prostate cancer. Skin cancer. Lung  cancer. What should I know about heart disease, diabetes, and high blood pressure? Blood pressure and heart disease High blood pressure causes heart disease and increases the risk of stroke. This is more likely to develop in people who have high blood pressure readings, are of African descent, or are overweight. Talk with your health care provider about your target blood pressure readings. Have your blood pressure checked: Every 3-5 years if you are 18-39 years of age. Every year if you are 40 years old or older. If you are between the ages of 65 and 75 and are a current or former smoker, ask your health care provider if you should have a one-time screening for abdominal aortic aneurysm (AAA). Diabetes Have regular diabetes screenings. This checks your fasting blood sugar level. Have the screening done: Once every three years after age 45 if you are at a normal weight and have a low risk for diabetes. More often and at a younger age if you are overweight or have a high risk for diabetes. What should I know about preventing infection? Hepatitis B If you have a higher risk for hepatitis B, you should be screened for this virus. Talk with your health care provider to find out if you are at risk for hepatitis B infection. Hepatitis C Blood testing is recommended for: Everyone born from 1945 through 1965. Anyone with known risk factors for hepatitis C. Sexually transmitted infections (STIs) You should be screened each year for STIs, including gonorrhea and chlamydia, if: You are sexually active and are younger than 52 years of age. You are older than 52 years   of age and your health care provider tells you that you are at risk for this type of infection. Your sexual activity has changed since you were last screened, and you are at increased risk for chlamydia or gonorrhea. Ask your health care provider if you are at risk. Ask your health care provider about whether you are at high risk for HIV.  Your health care provider may recommend a prescription medicine to help prevent HIV infection. If you choose to take medicine to prevent HIV, you should first get tested for HIV. You should then be tested every 3 months for as long as you are taking the medicine. Follow these instructions at home: Lifestyle Do not use any products that contain nicotine or tobacco, such as cigarettes, e-cigarettes, and chewing tobacco. If you need help quitting, ask your health care provider. Do not use street drugs. Do not share needles. Ask your health care provider for help if you need support or information about quitting drugs. Alcohol use Do not drink alcohol if your health care provider tells you not to drink. If you drink alcohol: Limit how much you have to 0-2 drinks a day. Be aware of how much alcohol is in your drink. In the [place] one drink equals one 12 oz bottle of beer (355 mL), one 5 oz glass of wine (148 mL), or one 1 oz glass of hard liquor (44 mL). General instructions Schedule regular health, dental, and eye exams. Stay current with your vaccines. Tell your health care provider if: You often feel depressed. You have ever been abused or do not feel safe at home. Summary Adopting a healthy lifestyle and getting preventive care are important in promoting health and wellness. Follow your health care provider's instructions about healthy diet, exercising, and getting tested or screened for diseases. Follow your health care provider's instructions on monitoring your cholesterol and blood pressure. This information is not intended to replace advice given to you by your health care provider. Make sure you discuss any questions you have with your health care provider. Document Revised: 02/06/2021 Document Reviewed: 11/22/2018 Elsevier Patient Education  2022 Elsevier Inc.  

## 2021-10-05 NOTE — Progress Notes (Signed)
I,[name],acting as a Neurosurgeon for [employer], FNP.,have documented all relevant documentation on the behalf of [name], FNP,as directed by  [name], FNP while in the presence of [name], FNP.  This visit occurred during the SARS-CoV-2 public health emergency.  Safety protocols were in place, including screening questions prior to the visit, additional usage of staff PPE, and extensive cleaning of exam room while observing appropriate contact time as indicated for disinfecting solutions.  Subjective:     Patient ID: Alex Harrison , male    DOB: 1971-07-25 , 50 y.o.   MRN: [id number]   Chief Complaint  Patient presents with   Annual Exam    HPI  Here for HM.  No current concerns or issues.  He did see the ENT who mentioned he has some hearing loss to the right ear and beginning to lose hearing in the left ear, he is being referred to [facility] Readings from Last 3 Encounters: 10/05/21 : 298 lb 3.2 oz (135.3 kg) 08/03/21 : (!) 303 lb 6.4 oz (137.6 kg) 07/29/21 : 299 lb (135.6 kg) He is back on [place] and tolerating well.        Past Medical History:  Diagnosis Date   Allergic rhinitis    Allergy    seasonal allergic rhinitis   Diabetes mellitus without complication (HCC)    Dizziness    GERD (gastroesophageal reflux disease)    past hx    Gout    Hyperlipidemia    Hypertension    Left arm pain    Left elbow pain    Macular degeneration    shots every 3 months    Morbid obesity (HCC)    Neck nodule 09/16/2010   right      Family History  Problem Relation Age of Onset   Hypertension Mother    Diabetes Mother    Stroke Mother    Hypertension Father    Diabetes Father    Diabetes Sister    Hypertension Sister    Colon cancer Neg Hx    Colon polyps Neg Hx    Esophageal cancer Neg Hx    Rectal cancer Neg Hx    Stomach cancer Neg Hx      Current Outpatient Medications:    allopurinol (ZYLOPRIM) 300 MG tablet, Take 1  tablet by mouth once daily, Disp: 90 tablet, Rfl: 0   atorvastatin (LIPITOR) 40 MG tablet, TAKE 1 TABLET BY MOUTH AT BEDTIME, Disp: 90 tablet, Rfl: 0   azelastine (ASTELIN) 0.1 % nasal spray, Place 2 sprays into both nostrils 2 (two) times daily. Use in each nostril as directed, Disp: 30 mL, Rfl: 5   Calcium Carbonate (CALCIUM 600 PO), Take 1 tablet by mouth daily at 12 noon., Disp: , Rfl:    Cholecalciferol (VITAMIN D-3) 5000 UNITS TABS, Take by mouth., Disp: , Rfl:    hydrocortisone (ANUSOL-HC) 2.5 % rectal cream, Place 1 application rectally 2 (two) times daily., Disp: 30 g, Rfl: 0   levocetirizine (XYZAL) 5 MG tablet, Take 1 tablet (5 mg total) by mouth every evening., Disp: 90 tablet, Rfl: 1   MAGNESIUM GLUCONATE PO, Take 400 mg by mouth daily., Disp: , Rfl:    meclizine (ANTIVERT) 25 MG tablet, Take 25 mg by mouth 3 (three) times daily as needed., Disp: , Rfl:    Omega-3 Fatty Acids (FISH OIL PO), Take by mouth., Disp: , Rfl:    Semaglutide-Weight Management (WEGOVY) 2.4 MG/0.75ML SOAJ, Inject 2.4  mg into the skin once a week., Disp: 3 mL, Rfl: 1   Allergies  Allergen Reactions   Niaspan [Niacin Er]     Skin burning and itching and painful to touch.     Men's preventive visit. Patient Health Questionnaire (PHQ-2) is  Flowsheet Row Office Visit from 03/30/2021 in [facility]  PHQ-2 Total Score 0      Patient is on a regular diet, cutting back on sweets. He is only exercising with yard work. He has not wanted to walk until they figured out why he was feeling dizzy.  .   Marital status: Married. Relevant history for alcohol use is:  Social History   Substance and Sexual Activity  Alcohol Use No  . Relevant history for tobacco use is:  Social History   Tobacco Use  Smoking Status Never  Smokeless Tobacco Never  .   Review of Systems  Constitutional: Negative.   HENT: Negative.    Eyes: Negative.   Respiratory: Negative.    Cardiovascular: Negative.    Gastrointestinal: Negative.   Endocrine: Negative.   Genitourinary: Negative.   Musculoskeletal: Negative.   Skin: Negative.   Allergic/Immunologic: Negative.   Neurological: Negative.   Hematological: Negative.   Psychiatric/Behavioral: Negative.      Today's Vitals   10/05/21 0859  BP: 120/78  Pulse: 80  Temp: 98.3 F (36.8 C)  TempSrc: Oral  Weight: 298 lb 3.2 oz (135.3 kg)  Height: 5' 8.4" (1.737 m)   Body mass index is 44.81 kg/m.   Objective:  Physical Exam Vitals reviewed. Exam conducted with a chaperone present.  Constitutional:      General: He is not in acute distress.    Appearance: Normal appearance. He is obese.  HENT:     Head: Normocephalic and atraumatic.     Right Ear: Tympanic membrane, ear canal and external ear normal. There is no impacted cerumen.     Left Ear: Tympanic membrane, ear canal and external ear normal. There is no impacted cerumen.     Nose:     Comments: Deferred - masked    Mouth/Throat:     Comments: Deferred - masked Eyes:     Extraocular Movements: Extraocular movements intact.     Conjunctiva/sclera: Conjunctivae normal.     Pupils: Pupils are equal, round, and reactive to light.  Cardiovascular:     Rate and Rhythm: Normal rate and regular rhythm.     Pulses: Normal pulses.     Heart sounds: Normal heart sounds. No murmur heard. Pulmonary:     Effort: Pulmonary effort is normal. No respiratory distress.     Breath sounds: Normal breath sounds. No wheezing.  Abdominal:     General: Abdomen is flat. Bowel sounds are normal. There is no distension.     Palpations: Abdomen is soft. There is no mass.     Tenderness: There is no abdominal tenderness.  Genitourinary:    Prostate: Normal.     Rectum: Guaiac result negative.  Musculoskeletal:        General: No swelling or tenderness. Normal range of motion.     Cervical back: Normal range of motion and neck supple. No tenderness.  Skin:    General: Skin is warm and dry.      Capillary Refill: Capillary refill takes less than 2 seconds.  Neurological:     General: No focal deficit present.     Mental Status: He is alert and oriented to person, place, and time.  Cranial Nerves: No cranial nerve deficit.     Motor: No weakness.  Psychiatric:        Mood and Affect: Mood normal.        Behavior: Behavior normal.        Thought Content: Thought content normal.        Judgment: Judgment normal.        Assessment And Plan:    1. Encounter for general adult medical examination w/o abnormal findings Behavior modifications discussed and diet history reviewed.   Pt will continue to exercise regularly and modify diet with low GI, plant based foods and decrease intake of processed foods.  Recommend intake of daily multivitamin, Vitamin D, and calcium.  Recommend colonoscopy (up to date) for preventive screenings, as well as recommend immunizations that include influenza, TDAP, and Shingles (he will need to wait for his shingrix had flu shot today - his varicella titer is positive of 619)  2. Morbid obesity with BMI of 40.0-44.9, adult (HCC) Chronic Discussed healthy diet and regular exercise options  Encouraged to exercise at least 150 minutes per week with 2 days of strength training Continue wegovy 2.4 mg, tolerating well  Return in 2 months for weight check.  3. Need for influenza vaccination Influenza vaccine administered Encouraged to take Tylenol as needed for fever or muscle aches. - Flu Vaccine QUAD 6+ mos PF IM (Fluarix Quad PF)  4. Other long term (current) drug therapy  5. Encounter for prostate cancer screening Normal prostate exam - PSA  6. Hypertension, unspecified type Comments: Blood pressure is well controlled, continue healthy diet low in salt and encouraged to exercise as tolerated - POCT Urinalysis Dipstick (81002) - POCT UA - Microalbumin - EKG 12-Lead  7. Prediabetes Comments: Good control with diet and exercise, no current  medications - Hemoglobin A1c  8. Mixed hyperlipidemia Comments: Stable, focus on diet low in fat Continue statin, tolerating well.  - Lipid panel  9. Vitamin D deficiency - VITAMIN D 25 Hydroxy (Vit-D Deficiency, Fractures)  10. Gout, unspecified cause, unspecified chronicity, unspecified site Comments: No recent exacerbations, will check uric acid levels - Uric acid     Patient was given opportunity to ask questions. Patient verbalized understanding of the plan and was able to repeat key elements of the plan. All questions were answered to their satisfaction.   [name], FNP   I, [name], FNP, have reviewed all documentation for this visit. The documentation on 10/07/21 for the exam, diagnosis, procedures, and orders are all accurate and complete.   THE PATIENT IS ENCOURAGED TO PRACTICE SOCIAL DISTANCING DUE TO THE COVID-19 PANDEMIC.

## 2021-10-06 LAB — PSA: Prostate Specific Ag, Serum: 0.8 ng/mL (ref 0.0–4.0)

## 2021-10-06 LAB — URIC ACID: Uric Acid: 4.1 mg/dL (ref 3.8–8.4)

## 2021-10-06 LAB — HEMOGLOBIN A1C
Est. average glucose Bld gHb Est-mCnc: 114 mg/dL
Hgb A1c MFr Bld: 5.6 % (ref 4.8–5.6)

## 2021-10-06 LAB — LIPID PANEL
Chol/HDL Ratio: 2.6 ratio (ref 0.0–5.0)
Cholesterol, Total: 100 mg/dL (ref 100–199)
HDL: 38 mg/dL — ABNORMAL LOW (ref >39)
LDL Chol Calc (NIH): 50 mg/dL (ref 0–99)
Triglycerides: 49 mg/dL (ref 0–149)
VLDL Cholesterol Cal: 12 mg/dL (ref 5–40)

## 2021-10-06 LAB — VITAMIN D 25 HYDROXY (VIT D DEFICIENCY, FRACTURES): Vit D, 25-Hydroxy: 76.8 ng/mL (ref 30.0–100.0)

## 2021-10-13 ENCOUNTER — Other Ambulatory Visit (HOSPITAL_COMMUNITY): Payer: Self-pay

## 2021-10-27 ENCOUNTER — Telehealth: Payer: Self-pay

## 2021-10-27 NOTE — Telephone Encounter (Signed)
Called patient to let him know the cost of HST. Pt will need to call insurance company to find out what their portion will be charged to patient. [employer] will charge insurance [phone]. LVM for patient with this information. Will wait for patient to call us back when ready to schedule.

## 2021-11-05 ENCOUNTER — Other Ambulatory Visit: Payer: Self-pay | Admitting: Nurse Practitioner

## 2021-11-05 DIAGNOSIS — J302 Other seasonal allergic rhinitis: Secondary | ICD-10-CM

## 2021-11-05 DIAGNOSIS — M109 Gout, unspecified: Secondary | ICD-10-CM

## 2021-11-05 DIAGNOSIS — E7849 Other hyperlipidemia: Secondary | ICD-10-CM

## 2021-11-17 ENCOUNTER — Other Ambulatory Visit: Payer: Self-pay | Admitting: Nurse Practitioner

## 2021-11-18 ENCOUNTER — Other Ambulatory Visit (HOSPITAL_COMMUNITY): Payer: Self-pay

## 2021-11-18 MED ORDER — WEGOVY 2.4 MG/0.75ML ~~LOC~~ SOAJ
2.4000 mg | SUBCUTANEOUS | 1 refills | Status: DC
  Filled 2021-11-18: qty 3, 28d supply, fill #0
  Filled 2021-12-17: qty 3, 28d supply, fill #1

## 2021-12-17 ENCOUNTER — Other Ambulatory Visit (HOSPITAL_COMMUNITY): Payer: Self-pay

## 2021-12-28 ENCOUNTER — Other Ambulatory Visit: Payer: Self-pay

## 2021-12-28 ENCOUNTER — Encounter: Payer: Self-pay | Admitting: Nurse Practitioner

## 2021-12-28 MED ORDER — MECLIZINE HCL 25 MG PO TABS: 25.0000 mg | ORAL_TABLET | Freq: Three times a day (TID) | ORAL | 0 refills | Status: DC | PRN

## 2022-01-11 ENCOUNTER — Ambulatory Visit: Payer: BC Managed Care – PPO | Admitting: Nurse Practitioner

## 2022-01-11 ENCOUNTER — Other Ambulatory Visit (HOSPITAL_COMMUNITY): Payer: Self-pay

## 2022-01-11 ENCOUNTER — Encounter: Payer: Self-pay | Admitting: Nurse Practitioner

## 2022-01-11 ENCOUNTER — Other Ambulatory Visit: Payer: Self-pay | Admitting: Nurse Practitioner

## 2022-01-11 ENCOUNTER — Other Ambulatory Visit: Payer: Self-pay

## 2022-01-11 VITALS — BP 118/76 | HR 73 | Temp 98.0°F | Ht 68.4 in | Wt 298.8 lb

## 2022-01-11 DIAGNOSIS — E559 Vitamin D deficiency, unspecified: Secondary | ICD-10-CM

## 2022-01-11 DIAGNOSIS — E782 Mixed hyperlipidemia: Secondary | ICD-10-CM | POA: Diagnosis not present

## 2022-01-11 DIAGNOSIS — R7303 Prediabetes: Secondary | ICD-10-CM | POA: Diagnosis not present

## 2022-01-11 DIAGNOSIS — H903 Sensorineural hearing loss, bilateral: Secondary | ICD-10-CM

## 2022-01-11 DIAGNOSIS — Z6841 Body Mass Index (BMI) 40.0 and over, adult: Secondary | ICD-10-CM

## 2022-01-11 DIAGNOSIS — Z23 Encounter for immunization: Secondary | ICD-10-CM

## 2022-01-11 DIAGNOSIS — R42 Dizziness and giddiness: Secondary | ICD-10-CM

## 2022-01-11 DIAGNOSIS — I10 Essential (primary) hypertension: Secondary | ICD-10-CM | POA: Diagnosis not present

## 2022-01-11 LAB — LIPID PANEL
Chol/HDL Ratio: 3 ratio (ref 0.0–5.0)
Cholesterol, Total: 128 mg/dL (ref 100–199)
HDL: 42 mg/dL (ref >39)
LDL Chol Calc (NIH): 73 mg/dL (ref 0–99)
Triglycerides: 59 mg/dL (ref 0–149)
VLDL Cholesterol Cal: 13 mg/dL (ref 5–40)

## 2022-01-11 MED ORDER — AZELASTINE HCL 0.1 % NA SOLN: 2.0000 | Freq: Two times a day (BID) | NASAL | 5 refills | Status: DC

## 2022-01-11 MED ORDER — ATORVASTATIN CALCIUM 40 MG PO TABS: 40.0000 mg | ORAL_TABLET | Freq: Every day | ORAL | 1 refills | Status: DC

## 2022-01-11 MED ORDER — WEGOVY 2.4 MG/0.75ML ~~LOC~~ SOAJ
2.4000 mg | SUBCUTANEOUS | 1 refills | Status: DC
  Filled 2022-01-11 – 2022-01-12 (×5): qty 3, 28d supply, fill #0

## 2022-01-11 NOTE — Progress Notes (Signed)
I,Alex Harrison,acting as a Education administrator for [employer], FNP.,have documented all relevant documentation on the behalf of Alex Brine, FNP,as directed by  Alex Brine, FNP while in the presence of Alex Harrison, [place].  This visit occurred during the SARS-CoV-2 public health emergency.  Safety protocols were in place, including screening questions prior to the visit, additional usage of staff PPE, and extensive cleaning of exam room while observing appropriate contact time as indicated for disinfecting solutions.  Subjective:     Patient ID: Alex Harrison , male    DOB: 1971/06/20 , 51 y.o.   MRN: [id number]   Chief Complaint  Patient presents with   Hypertension   Prediabetes   Hyperlipidemia    HPI  Patient presents today for htn and weight follow up. Continues to have dizziness with bending his head forward. His wife is present during the visit    Today's weight: 298 lbs Today's date: 01/11/2022  Total lbs lost since last in-office visit: 0 lbs  Wt Readings from Last 3 Encounters: 01/11/22 : 298 lb 12.8 oz (135.5 kg) 10/05/21 : 298 lb 3.2 oz (135.3 kg) 08/03/21 : (!) 303 lb 6.4 oz (137.6 kg)    Exercising: none Water intake:  Diet: he admits to drinking country time lemonade. Eats wheat bread. He does eat protein. He does eat spinach but is to not eat an excess amount to avoid kidney stones. Does eat rice every once in a while. He does eat a lot of popcorn.   Continues to have dizziness when he holds his head down. He is not exercising regularly. He has an appt with Neurology on Wednesday.   He is in the process of getting short term disability with his eye doctor before he takes his test at work in November in case he has problems with passing it      Past Medical History:  Diagnosis Date   Allergic rhinitis    Allergy    seasonal allergic rhinitis   Diabetes mellitus without complication (HCC)    Dizziness    GERD (gastroesophageal reflux disease)    past hx     Gout    Hyperlipidemia    Hypertension    Left arm pain    Left elbow pain    Macular degeneration    shots every 3 months    Morbid obesity ([place])    Neck nodule 09/16/2010   right      Family History  Problem Relation Age of Onset   Hypertension Mother    Diabetes Mother    Stroke Mother    Hypertension Father    Diabetes Father    Diabetes Sister    Hypertension Sister    Colon cancer Neg Hx    Colon polyps Neg Hx    Esophageal cancer Neg Hx    Rectal cancer Neg Hx    Stomach cancer Neg Hx      Current Outpatient Medications:    allopurinol (ZYLOPRIM) 300 MG tablet, Take 1 tablet by mouth once daily, Disp: 90 tablet, Rfl: 0   Calcium Carbonate (CALCIUM 600 PO), Take 1 tablet by mouth daily at 12 noon., Disp: , Rfl:    Cholecalciferol (VITAMIN D-3) 5000 UNITS TABS, Take by mouth., Disp: , Rfl:    hydrocortisone (ANUSOL-HC) 2.5 % rectal cream, Place 1 application rectally 2 (two) times daily., Disp: 30 g, Rfl: 0   levocetirizine (XYZAL) 5 MG tablet, TAKE 1 TABLET BY MOUTH ONCE DAILY IN THE EVENING, Disp: 90 tablet, Rfl:  0   MAGNESIUM GLUCONATE PO, Take 400 mg by mouth daily., Disp: , Rfl:    meclizine (ANTIVERT) 25 MG tablet, Take 1 tablet (25 mg total) by mouth 3 (three) times daily as needed., Disp: 90 tablet, Rfl: 0   Omega-3 Fatty Acids (FISH OIL PO), Take by mouth. 1 per day, Disp: , Rfl:    Semaglutide-Weight Management (WEGOVY) 2.4 MG/0.75ML SOAJ, Inject 2.4 mg into the skin once a week., Disp: 3 mL, Rfl: 1   atorvastatin (LIPITOR) 40 MG tablet, Take 1 tablet (40 mg total) by mouth at bedtime., Disp: 90 tablet, Rfl: 1   azelastine (ASTELIN) 0.1 % nasal spray, Place 2 sprays into both nostrils 2 (two) times daily. Use in each nostril as directed, Disp: 30 mL, Rfl: 5   Allergies  Allergen Reactions   Niaspan [Niacin Er]     Skin burning and itching and painful to touch.     Review of Systems  Constitutional: Negative.   Respiratory: Negative.    Cardiovascular:  Negative.   Gastrointestinal: Negative.   Neurological: Negative.     Today's Vitals   01/11/22 0947  BP: 118/76  Pulse: 73  Temp: 98 F (36.7 C)  Weight: 298 lb 12.8 oz (135.5 kg)  Height: 5' 8.4" (1.737 m)   Body mass index is 44.9 kg/m.   Objective:  Physical Exam Vitals reviewed.  Constitutional:      General: He is not in acute distress.    Appearance: Normal appearance. He is obese.  Cardiovascular:     Rate and Rhythm: Normal rate and regular rhythm.     Pulses: Normal pulses.     Heart sounds: Normal heart sounds. No murmur heard. Pulmonary:     Effort: Pulmonary effort is normal. No respiratory distress.     Breath sounds: Normal breath sounds. No wheezing.  Neurological:     General: No focal deficit present.     Mental Status: He is alert and oriented to person, place, and time.     Cranial Nerves: No cranial nerve deficit.     Motor: No weakness.  Psychiatric:        Mood and Affect: Mood normal.        Behavior: Behavior normal.        Thought Content: Thought content normal.        Judgment: Judgment normal.        Assessment And Plan:     1. Hypertension, unspecified type Comments: Blood pressure is well controlled. Continue current medications  2. Mixed hyperlipidemia Comments: Stable, continue statin and tolerating well. - Lipid panel - CMP14+EGFR - atorvastatin (LIPITOR) 40 MG tablet; Take 1 tablet (40 mg total) by mouth at bedtime.  Dispense: 90 tablet; Refill: 1  3. Prediabetes Comments: Improved at last visit, continue to focus on eating a diet low in sugar and starches.  - Hemoglobin A1c - CMP14+EGFR  4. Vitamin D deficiency Will check vitamin D level and supplement as needed.    Also encouraged to spend 15 minutes in the sun daily.   5. Sensorineural hearing loss (SNHL) of both ears Comments: He reports having right complete hearing loss and left ear has some hearing loss as well, continue follow up with ENT  6.  Dizziness Comments: He has an appt with Neurology on Wednesday and continues to follow up with ENT.   7. Morbid obesity with BMI of 40.0-44.9, adult ([place]) Weight has been stable with Wegovy. I have encouraged him to focus heavily  on his diet since he is not able to exercise due to the dizziness to help with his weight.   8. Need for vaccination - Varicella-zoster vaccine IM (Shingrix)   Patient was given opportunity to ask questions. Patient verbalized understanding of the plan and was able to repeat key elements of the plan. All questions were answered to their satisfaction.  Alex Brine, FNP   I, Alex Brine, FNP, have reviewed all documentation for this visit. The documentation on 01/11/22 for the exam, diagnosis, procedures, and orders are all accurate and complete.   IF YOU HAVE BEEN REFERRED TO A SPECIALIST, IT MAY TAKE 1-2 WEEKS TO SCHEDULE/PROCESS THE REFERRAL. IF YOU HAVE NOT HEARD FROM US/SPECIALIST IN TWO WEEKS, PLEASE GIVE [place] A CALL AT [phone].   THE PATIENT IS ENCOURAGED TO PRACTICE SOCIAL DISTANCING DUE TO THE COVID-19 PANDEMIC.

## 2022-01-11 NOTE — Progress Notes (Signed)
I,Rodriguez Aguinaldo,acting as a Neurosurgeon for [employer], FNP.,have documented all relevant documentation on the behalf of [name], FNP,as directed by  [name], FNP while in the presence of [name], FNP.  This visit occurred during the SARS-CoV-2 public health emergency.  Safety protocols were in place, including screening questions prior to the visit, additional usage of staff PPE, and extensive cleaning of exam room while observing appropriate contact time as indicated for disinfecting solutions.  Subjective:     Patient ID: Alex Harrison , male    DOB: 14-Mar-1971 , 51 y.o.   MRN: [id number]   Chief Complaint  Patient presents with   Hypertension   Prediabetes   Hyperlipidemia    HPI  The patient is here today for a blood pressure, prediabetes, and cholesterol follow-up.  Hypertension  Hyperlipidemia    Past Medical History:  Diagnosis Date   Allergic rhinitis    Allergy    seasonal allergic rhinitis   Diabetes mellitus without complication (HCC)    Dizziness    GERD (gastroesophageal reflux disease)    past hx    Gout    Hyperlipidemia    Hypertension    Left arm pain    Left elbow pain    Macular degeneration    shots every 3 months    Morbid obesity (HCC)    Neck nodule 09/16/2010   right      Family History  Problem Relation Age of Onset   Hypertension Mother    Diabetes Mother    Stroke Mother    Hypertension Father    Diabetes Father    Diabetes Sister    Hypertension Sister    Colon cancer Neg Hx    Colon polyps Neg Hx    Esophageal cancer Neg Hx    Rectal cancer Neg Hx    Stomach cancer Neg Hx      Current Outpatient Medications:    allopurinol (ZYLOPRIM) 300 MG tablet, Take 1 tablet by mouth once daily, Disp: 90 tablet, Rfl: 0   atorvastatin (LIPITOR) 40 MG tablet, TAKE 1 TABLET BY MOUTH AT BEDTIME, Disp: 90 tablet, Rfl: 0   Calcium Carbonate (CALCIUM 600 PO), Take 1 tablet by mouth daily at 12 noon., Disp: , Rfl:     Cholecalciferol (VITAMIN D-3) 5000 UNITS TABS, Take by mouth., Disp: , Rfl:    hydrocortisone (ANUSOL-HC) 2.5 % rectal cream, Place 1 application rectally 2 (two) times daily., Disp: 30 g, Rfl: 0   levocetirizine (XYZAL) 5 MG tablet, TAKE 1 TABLET BY MOUTH ONCE DAILY IN THE EVENING, Disp: 90 tablet, Rfl: 0   MAGNESIUM GLUCONATE PO, Take 400 mg by mouth daily., Disp: , Rfl:    meclizine (ANTIVERT) 25 MG tablet, Take 1 tablet (25 mg total) by mouth 3 (three) times daily as needed., Disp: 90 tablet, Rfl: 0   Omega-3 Fatty Acids (FISH OIL PO), Take by mouth. 1 per day, Disp: , Rfl:    Semaglutide-Weight Management (WEGOVY) 2.4 MG/0.75ML SOAJ, Inject 2.4 mg into the skin once a week., Disp: 3 mL, Rfl: 1   azelastine (ASTELIN) 0.1 % nasal spray, Place 2 sprays into both nostrils 2 (two) times daily. Use in each nostril as directed, Disp: 30 mL, Rfl: 5   Allergies  Allergen Reactions   Niaspan [Niacin Er]     Skin burning and itching and painful to touch.     Review of Systems   There were no vitals filed for this visit. There is no height  or weight on file to calculate BMI.   Objective:  Physical Exam      Assessment And Plan:     1. Hypertension, unspecified type  2. Mixed hyperlipidemia  3. Prediabetes  4. Vitamin D deficiency  5. Need for vaccination     Patient was given opportunity to ask questions. Patient verbalized understanding of the plan and was able to repeat key elements of the plan. All questions were answered to their satisfaction.  Mariam Dollar, CMA   I, Mariam Dollar, CMA, have reviewed all documentation for this visit. The documentation on 01/11/22 for the exam, diagnosis, procedures, and orders are all accurate and complete.   IF YOU HAVE BEEN REFERRED TO A SPECIALIST, IT MAY TAKE 1-2 WEEKS TO SCHEDULE/PROCESS THE REFERRAL. IF YOU HAVE NOT HEARD FROM US/SPECIALIST IN TWO WEEKS, PLEASE GIVE [place] A CALL AT [phone].   THE PATIENT IS ENCOURAGED TO  PRACTICE SOCIAL DISTANCING DUE TO THE COVID-19 PANDEMIC.

## 2022-01-11 NOTE — Patient Instructions (Signed)

## 2022-01-12 ENCOUNTER — Other Ambulatory Visit (HOSPITAL_COMMUNITY): Payer: Self-pay

## 2022-01-12 ENCOUNTER — Encounter: Payer: Self-pay | Admitting: Nurse Practitioner

## 2022-01-12 LAB — HEMOGLOBIN A1C
Est. average glucose Bld gHb Est-mCnc: 117 mg/dL
Hgb A1c MFr Bld: 5.7 % — ABNORMAL HIGH (ref 4.8–5.6)

## 2022-01-12 LAB — CMP14+EGFR
ALT: 25 IU/L (ref 0–44)
AST: 19 IU/L (ref 0–40)
Albumin/Globulin Ratio: 1.8 (ref 1.2–2.2)
Albumin: 4.5 g/dL (ref 3.8–4.9)
Alkaline Phosphatase: 89 IU/L (ref 44–121)
BUN/Creatinine Ratio: 11 (ref 9–20)
BUN: 12 mg/dL (ref 6–24)
Bilirubin Total: 0.8 mg/dL (ref 0.0–1.2)
CO2: 26 mmol/L (ref 20–29)
Calcium: 9.7 mg/dL (ref 8.7–10.2)
Chloride: 103 mmol/L (ref 96–106)
Creatinine, Ser: 1.07 mg/dL (ref 0.76–1.27)
Globulin, Total: 2.5 g/dL (ref 1.5–4.5)
Glucose: 77 mg/dL (ref 70–99)
Potassium: 4.4 mmol/L (ref 3.5–5.2)
Sodium: 141 mmol/L (ref 134–144)
Total Protein: 7 g/dL (ref 6.0–8.5)
eGFR: 83 mL/min/{1.73_m2} (ref >59)

## 2022-01-13 ENCOUNTER — Telehealth: Payer: Self-pay

## 2022-01-13 NOTE — Telephone Encounter (Signed)
The patient was notified that his insurance requires a signed letter giving the office consent to pursue the appeal on behalf of the patient for his wegovy medication.

## 2022-01-14 ENCOUNTER — Other Ambulatory Visit (HOSPITAL_BASED_OUTPATIENT_CLINIC_OR_DEPARTMENT_OTHER): Payer: Self-pay

## 2022-01-20 ENCOUNTER — Other Ambulatory Visit: Payer: Self-pay | Admitting: Nurse Practitioner

## 2022-01-20 DIAGNOSIS — K649 Unspecified hemorrhoids: Secondary | ICD-10-CM

## 2022-01-26 LAB — HM DIABETES EYE EXAM

## 2022-01-27 ENCOUNTER — Encounter: Payer: Self-pay | Admitting: Nurse Practitioner

## 2022-02-03 ENCOUNTER — Other Ambulatory Visit: Payer: Self-pay | Admitting: Nurse Practitioner

## 2022-02-03 DIAGNOSIS — J302 Other seasonal allergic rhinitis: Secondary | ICD-10-CM

## 2022-02-03 DIAGNOSIS — M109 Gout, unspecified: Secondary | ICD-10-CM

## 2022-02-09 ENCOUNTER — Other Ambulatory Visit: Payer: Self-pay

## 2022-02-09 ENCOUNTER — Ambulatory Visit (INDEPENDENT_AMBULATORY_CARE_PROVIDER_SITE_OTHER): Payer: BC Managed Care – PPO

## 2022-02-09 DIAGNOSIS — Z23 Encounter for immunization: Secondary | ICD-10-CM | POA: Diagnosis not present

## 2022-03-02 ENCOUNTER — Other Ambulatory Visit: Payer: Self-pay | Admitting: Nurse Practitioner

## 2022-04-13 ENCOUNTER — Encounter: Payer: Self-pay | Admitting: Nurse Practitioner

## 2022-04-13 ENCOUNTER — Ambulatory Visit: Payer: BC Managed Care – PPO | Admitting: Nurse Practitioner

## 2022-04-13 VITALS — BP 124/70 | HR 97 | Temp 97.9°F | Ht 68.4 in | Wt 305.0 lb

## 2022-04-13 DIAGNOSIS — E782 Mixed hyperlipidemia: Secondary | ICD-10-CM

## 2022-04-13 DIAGNOSIS — I10 Essential (primary) hypertension: Secondary | ICD-10-CM | POA: Diagnosis not present

## 2022-04-13 DIAGNOSIS — R7303 Prediabetes: Secondary | ICD-10-CM | POA: Diagnosis not present

## 2022-04-13 DIAGNOSIS — R42 Dizziness and giddiness: Secondary | ICD-10-CM

## 2022-04-13 DIAGNOSIS — Z6841 Body Mass Index (BMI) 40.0 and over, adult: Secondary | ICD-10-CM

## 2022-04-13 NOTE — Progress Notes (Signed)
?Industrial/product designer as a Education administrator for [employer], FNP.,have documented all relevant documentation on the behalf of [name], FNP,as directed by  [name], FNP while in the presence of [name], [place]. ? ?This visit occurred during the SARS-CoV-2 public health emergency.  Safety protocols were in place, including screening questions prior to the visit, additional usage of staff PPE, and extensive cleaning of exam room while observing appropriate contact time as indicated for disinfecting solutions. ? ?Subjective:  ?  ? Patient ID: Alex Harrison , male    DOB: 1971-02-06 , 51 y.o.   MRN: [id number] ? ? ?Chief Complaint  ?Patient presents with  ? Hypertension  ? ? ?HPI ? ?The patient is here today for a blood pressure, prediabetes, and cholesterol follow-up.  He is scheduled to see the specialist later this week. He is now on triamterene/HCTZ to see if this would help. He has been out of work since March 8th he has only had one episode.  ? ?Hypertension ?This is a chronic problem. The current episode started more than 1 year ago. The problem is unchanged. Pertinent negatives include no anxiety, chest pain or palpitations. Risk factors for coronary artery disease include obesity, sedentary lifestyle and dyslipidemia. Past treatments include nothing. There are no compliance problems.  There is no history of angina. There is no history of chronic renal disease.  ?Hyperlipidemia ?This is a chronic problem. The problem is controlled. Recent lipid tests were reviewed and are normal. He has no history of chronic renal disease. There are no known factors aggravating his hyperlipidemia. Pertinent negatives include no chest pain. Risk factors for coronary artery disease include obesity.   ? ?Past Medical History:  ?Diagnosis Date  ? Allergic rhinitis   ? Allergy   ? seasonal allergic rhinitis  ? Diabetes mellitus without complication ([place])   ? Dizziness   ? GERD (gastroesophageal reflux disease)   ? past hx   ?  Gout   ? Hyperlipidemia   ? Hypertension   ? Left arm pain   ? Left elbow pain   ? Macular degeneration   ? shots every 3 months   ? Morbid obesity ([place])   ? Neck nodule 09/16/2010  ? right   ?  ? ?Family History  ?Problem Relation Age of Onset  ? Hypertension Mother   ? Diabetes Mother   ? Stroke Mother   ? Hypertension Father   ? Diabetes Father   ? Diabetes Sister   ? Hypertension Sister   ? Colon cancer Neg Hx   ? Colon polyps Neg Hx   ? Esophageal cancer Neg Hx   ? Rectal cancer Neg Hx   ? Stomach cancer Neg Hx   ? ? ? ?Current Outpatient Medications:  ?  allopurinol (ZYLOPRIM) 300 MG tablet, Take 1 tablet by mouth once daily, Disp: 90 tablet, Rfl: 0 ?  atorvastatin (LIPITOR) 40 MG tablet, Take 1 tablet (40 mg total) by mouth at bedtime., Disp: 90 tablet, Rfl: 1 ?  azelastine (ASTELIN) 0.1 % nasal spray, Place 2 sprays into both nostrils 2 (two) times daily. Use in each nostril as directed, Disp: 30 mL, Rfl: 5 ?  Calcium Carbonate (CALCIUM 600 PO), Take 1 tablet by mouth daily at 12 noon., Disp: , Rfl:  ?  Cholecalciferol (VITAMIN D-3) 5000 UNITS TABS, Take by mouth., Disp: , Rfl:  ?  levocetirizine (XYZAL) 5 MG tablet, TAKE 1 TABLET BY MOUTH ONCE DAILY IN THE EVENING, Disp: 90 tablet, Rfl: 0 ?  MAGNESIUM GLUCONATE PO, Take 400 mg by mouth daily., Disp: , Rfl:  ?  meclizine (ANTIVERT) 25 MG tablet, Take 1 tablet by mouth three times daily as needed, Disp: 90 tablet, Rfl: 0 ?  Omega-3 Fatty Acids (FISH OIL PO), Take by mouth. 1 per day, Disp: , Rfl:  ?  PROCTO-MED HC 2.5 % rectal cream, PLACE 1 APPLICATORFUL RECTALLY TWICE DAILY, Disp: 28 g, Rfl: 0 ?  triamterene-hydrochlorothiazide (DYAZIDE) 37.5-25 MG capsule, Take 1 capsule by mouth daily., Disp: , Rfl:   ? ?Allergies  ?Allergen Reactions  ? Niaspan [Niacin Er]   ?  Skin burning and itching and painful to touch.  ?  ? ?Review of Systems  ?Constitutional: Negative.   ?Respiratory: Negative.    ?Cardiovascular: Negative.  Negative for chest pain, palpitations  and leg swelling.  ?Gastrointestinal: Negative.   ?Neurological: Negative.   ?Psychiatric/Behavioral: Negative.     ? ?Today's Vitals  ? 04/13/22 0937  ?BP: 124/70  ?Pulse: 97  ?Temp: 97.9 ?F (36.6 ?C)  ?TempSrc: Oral  ?Weight: (!) 305 lb (138.3 kg)  ?Height: 5' 8.4" (1.737 m)  ? ?Body mass index is 45.83 kg/m?.  ?Wt Readings from Last 3 Encounters:  ?04/13/22 (!) 305 lb (138.3 kg)  ?01/11/22 298 lb 12.8 oz (135.5 kg)  ?10/05/21 298 lb 3.2 oz (135.3 kg)  ? ? ?Objective:  ?Physical Exam ?Vitals reviewed.  ?Constitutional:   ?   General: He is not in acute distress. ?   Appearance: Normal appearance. He is obese.  ?Cardiovascular:  ?   Rate and Rhythm: Normal rate and regular rhythm.  ?   Pulses: Normal pulses.  ?   Heart sounds: Normal heart sounds. No murmur heard. ?Pulmonary:  ?   Effort: Pulmonary effort is normal. No respiratory distress.  ?   Breath sounds: Normal breath sounds. No wheezing.  ?Skin: ?   General: Skin is warm and dry.  ?Neurological:  ?   General: No focal deficit present.  ?   Mental Status: He is alert and oriented to person, place, and time.  ?   Cranial Nerves: No cranial nerve deficit.  ?   Motor: No weakness.  ?Psychiatric:     ?   Mood and Affect: Mood normal.     ?   Behavior: Behavior normal.     ?   Thought Content: Thought content normal.     ?   Judgment: Judgment normal.  ?  ? ?   ?Assessment And Plan:  ?   ?1. Hypertension, unspecified type ?Comments: Blood pressure is well controlled, recently given blood pressure medications at ENT to help with vertigo ?- CMP14+EGFR ? ?2. Mixed hyperlipidemia ?Comments: Stable, continue medications and low fat diet ?- Lipid panel ?- CMP14+EGFR ? ?3. Prediabetes ?Comments: Stable, diet controlled.  Diabetic foot exam done with decreased sensation bilaterally. Discussed diabetic foot care and to avoid walking barefoot.  ?- Hemoglobin A1c ? ?4. Vertigo ?Comments: This has improved with him taking the Triamterene/HCTZ, he has a follow up with ENT next  week. Reviewed notes from ENT at [facility] thoughts he may have vestibular vertigo. ENT has him out of work at this time.  ? ?5. Class 3 severe obesity due to excess calories without serious comorbidity with body mass index (BMI) of 45.0 to 49.9 in adult [facility]) ?Comments: He has not had Wegovy since last visit, continues to eat high sugar foods and has gained approximately 7 lbs. Will refer to Weight Management [facility].  He is encouraged to initially strive for BMI less than 30 to decrease cardiac risk. He is advised to exercise no less than 150 minutes per week.  ?- Amb Ref to Medical Weight Management ? ? ?Patient was given opportunity to ask questions. Patient verbalized understanding of the plan and was able to repeat key elements of the plan. All questions were answered to their satisfaction.  ?[name], FNP  ? ?I, [name], FNP, have reviewed all documentation for this visit. The documentation on 04/13/22 for the exam, diagnosis, procedures, and orders are all accurate and complete.  ? ?IF YOU HAVE BEEN REFERRED TO A SPECIALIST, IT MAY TAKE 1-2 WEEKS TO SCHEDULE/PROCESS THE REFERRAL. IF YOU HAVE NOT HEARD FROM US/SPECIALIST IN TWO WEEKS, PLEASE GIVE [place] A CALL AT [phone].  ? ?THE PATIENT IS ENCOURAGED TO PRACTICE SOCIAL DISTANCING DUE TO THE COVID-19 PANDEMIC.   ?

## 2022-04-13 NOTE — Patient Instructions (Signed)

## 2022-04-14 LAB — CMP14+EGFR
ALT: 41 IU/L (ref 0–44)
AST: 28 IU/L (ref 0–40)
Albumin/Globulin Ratio: 1.6 (ref 1.2–2.2)
Albumin: 4.2 g/dL (ref 3.8–4.9)
Alkaline Phosphatase: 83 IU/L (ref 44–121)
BUN/Creatinine Ratio: 15 (ref 9–20)
BUN: 17 mg/dL (ref 6–24)
Bilirubin Total: 0.8 mg/dL (ref 0.0–1.2)
CO2: 24 mmol/L (ref 20–29)
Calcium: 9.6 mg/dL (ref 8.7–10.2)
Chloride: 101 mmol/L (ref 96–106)
Creatinine, Ser: 1.16 mg/dL (ref 0.76–1.27)
Globulin, Total: 2.6 g/dL (ref 1.5–4.5)
Glucose: 86 mg/dL (ref 70–99)
Potassium: 4.7 mmol/L (ref 3.5–5.2)
Sodium: 140 mmol/L (ref 134–144)
Total Protein: 6.8 g/dL (ref 6.0–8.5)
eGFR: 76 mL/min/{1.73_m2} (ref >59)

## 2022-04-14 LAB — LIPID PANEL
Chol/HDL Ratio: 3.2 ratio (ref 0.0–5.0)
Cholesterol, Total: 132 mg/dL (ref 100–199)
HDL: 41 mg/dL (ref >39)
LDL Chol Calc (NIH): 77 mg/dL (ref 0–99)
Triglycerides: 66 mg/dL (ref 0–149)
VLDL Cholesterol Cal: 14 mg/dL (ref 5–40)

## 2022-04-14 LAB — HEMOGLOBIN A1C
Est. average glucose Bld gHb Est-mCnc: 123 mg/dL
Hgb A1c MFr Bld: 5.9 % — ABNORMAL HIGH (ref 4.8–5.6)

## 2022-05-02 ENCOUNTER — Other Ambulatory Visit: Payer: Self-pay | Admitting: Nurse Practitioner

## 2022-05-02 DIAGNOSIS — M109 Gout, unspecified: Secondary | ICD-10-CM

## 2022-05-02 DIAGNOSIS — J302 Other seasonal allergic rhinitis: Secondary | ICD-10-CM

## 2022-05-26 ENCOUNTER — Other Ambulatory Visit: Payer: Self-pay | Admitting: Nurse Practitioner

## 2022-05-27 MED ORDER — MECLIZINE HCL 25 MG PO TABS: 25.0000 mg | ORAL_TABLET | Freq: Three times a day (TID) | ORAL | 0 refills | Status: DC | PRN

## 2022-07-31 ENCOUNTER — Other Ambulatory Visit: Payer: Self-pay | Admitting: Nurse Practitioner

## 2022-07-31 DIAGNOSIS — E782 Mixed hyperlipidemia: Secondary | ICD-10-CM

## 2022-09-05 ENCOUNTER — Other Ambulatory Visit: Payer: Self-pay | Admitting: Nurse Practitioner

## 2022-09-05 DIAGNOSIS — M109 Gout, unspecified: Secondary | ICD-10-CM

## 2022-09-06 MED ORDER — ALLOPURINOL 300 MG PO TABS: 300.0000 mg | ORAL_TABLET | Freq: Every day | ORAL | 0 refills | Status: DC

## 2022-09-06 MED ORDER — MECLIZINE HCL 25 MG PO TABS: 25.0000 mg | ORAL_TABLET | Freq: Three times a day (TID) | ORAL | 0 refills | Status: DC | PRN

## 2022-10-12 ENCOUNTER — Encounter: Payer: BC Managed Care – PPO | Admitting: Nurse Practitioner

## 2022-11-06 ENCOUNTER — Other Ambulatory Visit: Payer: Self-pay | Admitting: Nurse Practitioner

## 2022-11-06 DIAGNOSIS — J302 Other seasonal allergic rhinitis: Secondary | ICD-10-CM

## 2022-11-28 ENCOUNTER — Other Ambulatory Visit: Payer: Self-pay | Admitting: Nurse Practitioner

## 2022-11-28 DIAGNOSIS — J302 Other seasonal allergic rhinitis: Secondary | ICD-10-CM

## 2022-11-28 DIAGNOSIS — M109 Gout, unspecified: Secondary | ICD-10-CM

## 2022-11-28 DIAGNOSIS — E782 Mixed hyperlipidemia: Secondary | ICD-10-CM

## 2022-12-02 ENCOUNTER — Other Ambulatory Visit: Payer: Self-pay | Admitting: Nurse Practitioner

## 2022-12-02 DIAGNOSIS — M109 Gout, unspecified: Secondary | ICD-10-CM

## 2022-12-09 ENCOUNTER — Telehealth (INDEPENDENT_AMBULATORY_CARE_PROVIDER_SITE_OTHER): Payer: BC Managed Care – PPO | Admitting: Nurse Practitioner

## 2022-12-09 ENCOUNTER — Encounter: Payer: Self-pay | Admitting: Nurse Practitioner

## 2022-12-09 DIAGNOSIS — R7303 Prediabetes: Secondary | ICD-10-CM | POA: Diagnosis not present

## 2022-12-09 DIAGNOSIS — J302 Other seasonal allergic rhinitis: Secondary | ICD-10-CM

## 2022-12-09 DIAGNOSIS — I10 Essential (primary) hypertension: Secondary | ICD-10-CM

## 2022-12-09 DIAGNOSIS — E559 Vitamin D deficiency, unspecified: Secondary | ICD-10-CM | POA: Diagnosis not present

## 2022-12-09 DIAGNOSIS — E782 Mixed hyperlipidemia: Secondary | ICD-10-CM | POA: Diagnosis not present

## 2022-12-09 DIAGNOSIS — M109 Gout, unspecified: Secondary | ICD-10-CM

## 2022-12-09 MED ORDER — AZELASTINE HCL 0.1 % NA SOLN: 2.0000 | Freq: Two times a day (BID) | NASAL | 5 refills | Status: AC

## 2022-12-09 MED ORDER — ATORVASTATIN CALCIUM 40 MG PO TABS: 40.0000 mg | ORAL_TABLET | Freq: Every day | ORAL | 0 refills | Status: DC

## 2022-12-09 MED ORDER — MECLIZINE HCL 25 MG PO TABS: 25.0000 mg | ORAL_TABLET | Freq: Three times a day (TID) | ORAL | 1 refills | Status: DC | PRN

## 2022-12-09 MED ORDER — ALLOPURINOL 300 MG PO TABS: 300.0000 mg | ORAL_TABLET | Freq: Every day | ORAL | 0 refills | Status: DC

## 2022-12-09 MED ORDER — LEVOCETIRIZINE DIHYDROCHLORIDE 5 MG PO TABS: 5.0000 mg | ORAL_TABLET | Freq: Every evening | ORAL | 1 refills | Status: DC

## 2022-12-09 NOTE — Progress Notes (Signed)
Virtual Visit via MyChart   This visit type was conducted due to national recommendations for restrictions regarding the COVID-19 Pandemic (e.g. social distancing) in an effort to limit this patient's exposure and mitigate transmission in our community.  Due to his co-morbid illnesses, this patient is at least at moderate risk for complications without adequate follow up.  This format is felt to be most appropriate for this patient at this time.  All issues noted in this document were discussed and addressed.  A limited physical exam was performed with this format.    This visit type was conducted due to national recommendations for restrictions regarding the COVID-19 Pandemic (e.g. social distancing) in an effort to limit this patient's exposure and mitigate transmission in our community.  Patients identity confirmed using two different identifiers.  This format is felt to be most appropriate for this patient at this time.  All issues noted in this document were discussed and addressed.  No physical exam was performed (except for noted visual exam findings with Video Visits).    Date:  12/12/2022   ID:  Alex Harrison, DOB 11/27/1971, MRN [id number]  Patient Location:  Home - spoke with Alex Harrison  Provider location:   Office    Chief Complaint:  cholesterol f/u  History of Present Illness:    Alex Harrison is a 51 y.o. male who presents via video conferencing for a telehealth visit today.    The patient does not have symptoms concerning for COVID-19 infection (fever, chills, cough, or new shortness of breath).   Pt reports compliance with meds. He was to schedule for a physical October 31st. Was not able to reschedule.   He states 3-4 weeks ago he had a dizzy spell. Dr Markus Jarvis gave him bp med. [facility]. Today he feels better. He has not felt dizzy since then. He was treated with steroids and has been doing better since then. He was started on triamterene/hctz to try to  pull fluid from around his ear.   Denies headache, chest pain, SOB.      Past Medical History:  Diagnosis Date   Allergic rhinitis    Allergy    seasonal allergic rhinitis   Diabetes mellitus without complication (HCC)    Dizziness    GERD (gastroesophageal reflux disease)    past hx    Gout    Hyperlipidemia    Hypertension    Left arm pain    Left elbow pain    Macular degeneration    shots every 3 months    Morbid obesity ([place])    Neck nodule 09/16/2010   right    Past Surgical History:  Procedure Laterality Date   NO PAST SURGERIES       Current Meds  Medication Sig   Calcium Carbonate (CALCIUM 600 PO) Take 1 tablet by mouth daily at 12 noon.   Cholecalciferol (VITAMIN D-3) 5000 UNITS TABS Take by mouth.   MAGNESIUM GLUCONATE PO Take 400 mg by mouth daily.   Omega-3 Fatty Acids (FISH OIL PO) Take by mouth. 1 per day   PROCTO-MED HC 2.5 % rectal cream PLACE 1 APPLICATORFUL RECTALLY TWICE DAILY   triamterene-hydrochlorothiazide (DYAZIDE) 37.5-25 MG capsule Take 1 capsule by mouth daily.   [DISCONTINUED] allopurinol (ZYLOPRIM) 300 MG tablet Take 1 tablet by mouth once daily   [DISCONTINUED] atorvastatin (LIPITOR) 40 MG tablet TAKE 1 TABLET BY MOUTH AT BEDTIME   [DISCONTINUED] azelastine (ASTELIN) 0.1 % nasal spray Place 2 sprays into both  nostrils 2 (two) times daily. Use in each nostril as directed   [DISCONTINUED] levocetirizine (XYZAL) 5 MG tablet TAKE 1 TABLET BY MOUTH ONCE DAILY IN THE EVENING   [DISCONTINUED] meclizine (ANTIVERT) 25 MG tablet Take 1 tablet (25 mg total) by mouth 3 (three) times daily as needed.     Allergies:   Niaspan [niacin er]   Social History   Tobacco Use   Smoking status: Never   Smokeless tobacco: Never  Substance Use Topics   Alcohol use: No   Drug use: No     Family Hx: The patient's family history includes Diabetes in his father, mother, and sister; Hypertension in his father, mother, and sister; Stroke in his mother. There  is no history of Colon cancer, Colon polyps, Esophageal cancer, Rectal cancer, or Stomach cancer.  ROS:   Please see the history of present illness.    Review of Systems  Constitutional: Negative.   HENT: Negative.    Respiratory: Negative.    Gastrointestinal: Negative.   Genitourinary: Negative.   Endo/Heme/Allergies: Negative.   Psychiatric/Behavioral: Negative.      All other systems reviewed and are negative.   Labs/Other Tests and Data Reviewed:    Recent Labs: 04/13/2022: ALT 41; BUN 17; Creatinine, Ser 1.16; Potassium 4.7; Sodium 140   Recent Lipid Panel Lab Results  Component Value Date/Time   CHOL 132 04/13/2022 10:07 AM   CHOL 98 04/24/2013 09:25 AM   TRIG 66 04/13/2022 10:07 AM   TRIG 54 07/26/2013 10:15 AM   TRIG 67 04/24/2013 09:25 AM   HDL 41 04/13/2022 10:07 AM   HDL 47 07/26/2013 10:15 AM   HDL 36 (L) 04/24/2013 09:25 AM   CHOLHDL 3.2 04/13/2022 10:07 AM   LDLCALC 77 04/13/2022 10:07 AM   LDLCALC 51 07/26/2013 10:15 AM   LDLCALC 49 04/24/2013 09:25 AM    Wt Readings from Last 3 Encounters:  04/13/22 (!) 305 lb (138.3 kg)  01/11/22 298 lb 12.8 oz (135.5 kg)  10/05/21 298 lb 3.2 oz (135.3 kg)     Exam:    Vital Signs:  There were no vitals taken for this visit.    Physical Exam Vitals reviewed.  Constitutional:      General: He is not in acute distress.    Appearance: Normal appearance.  Pulmonary:     Effort: Pulmonary effort is normal. No respiratory distress.  Neurological:     General: No focal deficit present.     Mental Status: He is alert and oriented to person, place, and time. Mental status is at baseline.     Cranial Nerves: No cranial nerve deficit.  Psychiatric:        Mood and Affect: Mood and affect normal.        Behavior: Behavior normal.        Thought Content: Thought content normal.        Cognition and Memory: Memory normal.        Judgment: Judgment normal.    ASSESSMENT & PLAN:    1. Hypertension, unspecified  type Continue medications has been normal. ENT started him on triamterene/HCTZ to decrease fluid around ears.   2. Vitamin D deficiency Will check vitamin D level and supplement as needed.    Also encouraged to spend 15 minutes in the sun daily.   3. Prediabetes Stable, no current medications  - Hemoglobin A1c; Future - CMP14+EGFR; Future  4. Mixed hyperlipidemia Stable, continue statin and tolerating well.  - atorvastatin (LIPITOR) 40 MG  tablet; Take 1 tablet (40 mg total) by mouth at bedtime.  Dispense: 90 tablet; Refill: 0 - CMP14+EGFR; Future - Lipid panel; Future  5. Seasonal allergies  - levocetirizine (XYZAL) 5 MG tablet; Take 1 tablet (5 mg total) by mouth every evening.  Dispense: 90 tablet; Refill: 1  6. Gout of left foot, unspecified cause, unspecified chronicity No recent exacerbations  - allopurinol (ZYLOPRIM) 300 MG tablet; Take 1 tablet (300 mg total) by mouth daily.  Dispense: 90 tablet; Refill: 0   COVID-19 Education: The signs and symptoms of COVID-19 were discussed with the patient and how to seek care for testing (follow up with PCP or arrange E-visit).  The importance of social distancing was discussed today.  Patient Risk:   After full review of this patients clinical status, I feel that they are at least moderate risk at this time.  Time:   Today, I have spent 11.20 minutes/ seconds with the patient with telehealth technology discussing above diagnoses.     Medication Adjustments/Labs and Tests Ordered: Current medicines are reviewed at length with the patient today.  Concerns regarding medicines are outlined above.   Tests Ordered: Orders Placed This Encounter  Procedures   Hemoglobin A1c   CMP14+EGFR   Lipid panel    Medication Changes: Meds ordered this encounter  Medications   atorvastatin (LIPITOR) 40 MG tablet    Sig: Take 1 tablet (40 mg total) by mouth at bedtime.    Dispense:  90 tablet    Refill:  0   azelastine (ASTELIN) 0.1 %  nasal spray    Sig: Place 2 sprays into both nostrils 2 (two) times daily. Use in each nostril as directed    Dispense:  30 mL    Refill:  5   meclizine (ANTIVERT) 25 MG tablet    Sig: Take 1 tablet (25 mg total) by mouth 3 (three) times daily as needed.    Dispense:  90 tablet    Refill:  1   levocetirizine (XYZAL) 5 MG tablet    Sig: Take 1 tablet (5 mg total) by mouth every evening.    Dispense:  90 tablet    Refill:  1   allopurinol (ZYLOPRIM) 300 MG tablet    Sig: Take 1 tablet (300 mg total) by mouth daily.    Dispense:  90 tablet    Refill:  0    Disposition:  Follow up in 3 month(s)  Signed, [name], FNP

## 2022-12-14 ENCOUNTER — Ambulatory Visit (INDEPENDENT_AMBULATORY_CARE_PROVIDER_SITE_OTHER): Payer: BC Managed Care – PPO

## 2022-12-14 VITALS — BP 128/70 | HR 100 | Temp 98.3°F

## 2022-12-14 DIAGNOSIS — Z23 Encounter for immunization: Secondary | ICD-10-CM | POA: Diagnosis not present

## 2022-12-14 DIAGNOSIS — E782 Mixed hyperlipidemia: Secondary | ICD-10-CM

## 2022-12-14 DIAGNOSIS — R7303 Prediabetes: Secondary | ICD-10-CM

## 2022-12-14 NOTE — Progress Notes (Signed)
Patient presents today for shingles vaccine.  

## 2022-12-15 LAB — CMP14+EGFR
ALT: 31 IU/L (ref 0–44)
AST: 20 IU/L (ref 0–40)
Albumin/Globulin Ratio: 1.6 (ref 1.2–2.2)
Albumin: 4 g/dL (ref 3.8–4.9)
Alkaline Phosphatase: 90 IU/L (ref 44–121)
BUN/Creatinine Ratio: 12 (ref 9–20)
BUN: 12 mg/dL (ref 6–24)
Bilirubin Total: 0.7 mg/dL (ref 0.0–1.2)
CO2: 25 mmol/L (ref 20–29)
Calcium: 9.3 mg/dL (ref 8.7–10.2)
Chloride: 102 mmol/L (ref 96–106)
Creatinine, Ser: 1.02 mg/dL (ref 0.76–1.27)
Globulin, Total: 2.5 g/dL (ref 1.5–4.5)
Glucose: 134 mg/dL — ABNORMAL HIGH (ref 70–99)
Potassium: 3.7 mmol/L (ref 3.5–5.2)
Sodium: 139 mmol/L (ref 134–144)
Total Protein: 6.5 g/dL (ref 6.0–8.5)
eGFR: 88 mL/min/{1.73_m2} (ref >59)

## 2022-12-15 LAB — LIPID PANEL
Chol/HDL Ratio: 4 ratio (ref 0.0–5.0)
Cholesterol, Total: 127 mg/dL (ref 100–199)
HDL: 32 mg/dL — ABNORMAL LOW (ref >39)
LDL Chol Calc (NIH): 78 mg/dL (ref 0–99)
Triglycerides: 86 mg/dL (ref 0–149)
VLDL Cholesterol Cal: 17 mg/dL (ref 5–40)

## 2022-12-15 LAB — HEMOGLOBIN A1C
Est. average glucose Bld gHb Est-mCnc: 131 mg/dL
Hgb A1c MFr Bld: 6.2 % — ABNORMAL HIGH (ref 4.8–5.6)

## 2023-01-04 ENCOUNTER — Encounter: Payer: Self-pay | Admitting: Nurse Practitioner

## 2023-01-05 ENCOUNTER — Other Ambulatory Visit: Payer: Self-pay | Admitting: Nurse Practitioner

## 2023-01-05 DIAGNOSIS — R7303 Prediabetes: Secondary | ICD-10-CM

## 2023-01-05 MED ORDER — METFORMIN HCL 500 MG PO TABS: 500.0000 mg | ORAL_TABLET | Freq: Two times a day (BID) | ORAL | 2 refills | Status: DC

## 2023-03-03 ENCOUNTER — Ambulatory Visit (INDEPENDENT_AMBULATORY_CARE_PROVIDER_SITE_OTHER): Payer: BC Managed Care – PPO | Admitting: Nurse Practitioner

## 2023-03-03 ENCOUNTER — Encounter: Payer: Self-pay | Admitting: Nurse Practitioner

## 2023-03-03 VITALS — BP 134/86 | HR 80 | Temp 98.0°F | Ht 68.0 in | Wt 345.4 lb

## 2023-03-03 DIAGNOSIS — E559 Vitamin D deficiency, unspecified: Secondary | ICD-10-CM

## 2023-03-03 DIAGNOSIS — E782 Mixed hyperlipidemia: Secondary | ICD-10-CM

## 2023-03-03 DIAGNOSIS — Z0000 Encounter for general adult medical examination without abnormal findings: Secondary | ICD-10-CM | POA: Diagnosis not present

## 2023-03-03 DIAGNOSIS — Z125 Encounter for screening for malignant neoplasm of prostate: Secondary | ICD-10-CM

## 2023-03-03 DIAGNOSIS — J302 Other seasonal allergic rhinitis: Secondary | ICD-10-CM

## 2023-03-03 DIAGNOSIS — I10 Essential (primary) hypertension: Secondary | ICD-10-CM | POA: Diagnosis not present

## 2023-03-03 DIAGNOSIS — R42 Dizziness and giddiness: Secondary | ICD-10-CM | POA: Insufficient documentation

## 2023-03-03 DIAGNOSIS — M109 Gout, unspecified: Secondary | ICD-10-CM

## 2023-03-03 DIAGNOSIS — R7303 Prediabetes: Secondary | ICD-10-CM | POA: Diagnosis not present

## 2023-03-03 DIAGNOSIS — H832X1 Labyrinthine dysfunction, right ear: Secondary | ICD-10-CM | POA: Insufficient documentation

## 2023-03-03 DIAGNOSIS — Z6841 Body Mass Index (BMI) 40.0 and over, adult: Secondary | ICD-10-CM

## 2023-03-03 MED ORDER — BLOOD GLUCOSE MONITOR KIT: PACK | 0 refills | Status: DC

## 2023-03-03 MED ORDER — METFORMIN HCL 500 MG PO TABS: 500.0000 mg | ORAL_TABLET | Freq: Two times a day (BID) | ORAL | 2 refills | Status: DC

## 2023-03-03 MED ORDER — ZEPBOUND 2.5 MG/0.5ML ~~LOC~~ SOAJ: 2.5000 mg | SUBCUTANEOUS | 0 refills | Status: DC

## 2023-03-03 MED ORDER — ATORVASTATIN CALCIUM 40 MG PO TABS: 40.0000 mg | ORAL_TABLET | Freq: Every day | ORAL | 1 refills | Status: DC

## 2023-03-03 MED ORDER — MONTELUKAST SODIUM 10 MG PO TABS: 10.0000 mg | ORAL_TABLET | Freq: Every day | ORAL | 2 refills | Status: DC

## 2023-03-03 MED ORDER — ALLOPURINOL 300 MG PO TABS: 300.0000 mg | ORAL_TABLET | Freq: Every day | ORAL | 1 refills | Status: DC

## 2023-03-03 NOTE — Progress Notes (Signed)
I,Sheena H Holbrook,acting as a Education administrator for [name], FNP.,have documented all relevant documentation on the behalf of [name], FNP,as directed by  [name], FNP while in the presence of [name], [place].    Subjective:     Patient ID: Alex Harrison , male    DOB: 18-Apr-1971 , 52 y.o.   MRN: [id number]   Chief Complaint  Patient presents with   Annual Exam    HPI  Patient presents today for annual exam.  He had cataract surgery to right eye last year and did well   Wt Readings from Last 3 Encounters: 03/03/23 : (!) 345 lb 6.4 oz (156.7 kg) 04/13/22 : (!) 305 lb (138.3 kg) 01/11/22 : 298 lb 12.8 oz (135.5 kg)       Past Medical History:  Diagnosis Date   Allergic rhinitis    Allergy    seasonal allergic rhinitis   Diabetes mellitus without complication (HCC)    Dizziness    GERD (gastroesophageal reflux disease)    past hx    Gout    Hyperlipidemia    Hypertension    Left arm pain    Left elbow pain    Macular degeneration    shots every 3 months    Morbid obesity ([place])    Neck nodule 09/16/2010   right      Family History  Problem Relation Age of Onset   Hypertension Mother    Diabetes Mother    Stroke Mother    Hypertension Father    Diabetes Father    Diabetes Sister    Hypertension Sister    Colon cancer Neg Hx    Colon polyps Neg Hx    Esophageal cancer Neg Hx    Rectal cancer Neg Hx    Stomach cancer Neg Hx      Current Outpatient Medications:    azelastine (ASTELIN) 0.1 % nasal spray, Place 2 sprays into both nostrils 2 (two) times daily. Use in each nostril as directed, Disp: 30 mL, Rfl: 5   blood glucose meter kit and supplies KIT, Dispense based on patient and insurance preference. Use daily as directed., Disp: 1 each, Rfl: 0   Calcium Carbonate (CALCIUM 600 PO), Take 1 tablet by mouth daily at 12 noon., Disp: , Rfl:    Cholecalciferol (VITAMIN D-3) 5000 UNITS TABS, Take by mouth., Disp: , Rfl:    levocetirizine (XYZAL) 5  MG tablet, Take 1 tablet (5 mg total) by mouth every evening., Disp: 90 tablet, Rfl: 1   MAGNESIUM GLUCONATE PO, Take 400 mg by mouth daily., Disp: , Rfl:    meclizine (ANTIVERT) 25 MG tablet, Take 1 tablet (25 mg total) by mouth 3 (three) times daily as needed., Disp: 90 tablet, Rfl: 1   montelukast (SINGULAIR) 10 MG tablet, Take 1 tablet (10 mg total) by mouth daily., Disp: 30 tablet, Rfl: 2   Omega-3 Fatty Acids (FISH OIL PO), Take by mouth. 1 per day, Disp: , Rfl:    PROCTO-MED HC 2.5 % rectal cream, PLACE 1 APPLICATORFUL RECTALLY TWICE DAILY, Disp: 28 g, Rfl: 0   tirzepatide (ZEPBOUND) 2.5 MG/0.5ML Pen, Inject 2.5 mg into the skin once a week., Disp: 2 mL, Rfl: 0   triamterene-hydrochlorothiazide (DYAZIDE) 37.5-25 MG capsule, Take 1 capsule by mouth daily., Disp: , Rfl:    allopurinol (ZYLOPRIM) 300 MG tablet, Take 1 tablet (300 mg total) by mouth daily., Disp: 90 tablet, Rfl: 1   atorvastatin (LIPITOR) 40 MG tablet, Take 1 tablet (  40 mg total) by mouth at bedtime., Disp: 90 tablet, Rfl: 1   metFORMIN (GLUCOPHAGE) 500 MG tablet, Take 1 tablet (500 mg total) by mouth 2 (two) times daily with a meal., Disp: 60 tablet, Rfl: 2   Allergies  Allergen Reactions   Niaspan [Niacin Er]     Skin burning and itching and painful to touch.     Men's preventive visit. Patient Health Questionnaire (PHQ-2) is  [place] Office Visit from 03/03/2023 in [facility]  PHQ-2 Total Score 0     Patient is on a regular diet; not eating as many sweets but does eat chips..  Exercising - none. Marital status: Married. Relevant history for alcohol use is:  Social History   Substance and Sexual Activity  Alcohol Use No  . Relevant history for tobacco use is:  Social History   Tobacco Use  Smoking Status Never  Smokeless Tobacco Never  .   Review of Systems  Constitutional: Negative.   HENT: Negative.    Eyes: Negative.   Respiratory: Negative.    Cardiovascular:  Negative.   Gastrointestinal: Negative.   Endocrine: Negative.   Genitourinary: Negative.   Musculoskeletal: Negative.   Skin: Negative.   Allergic/Immunologic: Negative.   Neurological: Negative.   Hematological: Negative.   Psychiatric/Behavioral: Negative.       Today's Vitals   03/03/23 1026  BP: 134/86  Pulse: 80  Temp: 98 F (36.7 C)  TempSrc: Oral  SpO2: 96%  Weight: (!) 345 lb 6.4 oz (156.7 kg)  Height: 5\' 8"  (1.727 m)   Body mass index is 52.52 kg/m.   Objective:  Physical Exam Vitals reviewed. Exam conducted with a chaperone present.  Constitutional:      General: He is not in acute distress.    Appearance: Normal appearance. He is obese.  HENT:     Head: Normocephalic and atraumatic.     Right Ear: Tympanic membrane, ear canal and external ear normal. There is no impacted cerumen.     Left Ear: Tympanic membrane, ear canal and external ear normal. There is no impacted cerumen.     Nose:     Comments: Deferred - masked    Mouth/Throat:     Comments: Deferred - masked Eyes:     Extraocular Movements: Extraocular movements intact.     Conjunctiva/sclera: Conjunctivae normal.     Pupils: Pupils are equal, round, and reactive to light.  Cardiovascular:     Rate and Rhythm: Normal rate and regular rhythm.     Pulses: Normal pulses.     Heart sounds: Normal heart sounds. No murmur heard. Pulmonary:     Effort: Pulmonary effort is normal. No respiratory distress.     Breath sounds: Normal breath sounds. No wheezing.  Abdominal:     General: Abdomen is flat. Bowel sounds are normal. There is no distension.     Palpations: Abdomen is soft. There is no mass.     Tenderness: There is no abdominal tenderness.  Genitourinary:    Prostate: Normal.     Rectum: Guaiac result negative.  Musculoskeletal:        General: No swelling or tenderness. Normal range of motion.     Cervical back: Normal range of motion and neck supple. No tenderness.  Skin:    General:  Skin is warm and dry.     Capillary Refill: Capillary refill takes less than 2 seconds.  Neurological:     General: No focal deficit present.  Mental Status: He is alert and oriented to person, place, and time.     Cranial Nerves: No cranial nerve deficit.     Motor: No weakness.  Psychiatric:        Mood and Affect: Mood normal.        Behavior: Behavior normal.        Thought Content: Thought content normal.        Judgment: Judgment normal.         Assessment And Plan:    1. Encounter for health maintenance examination Behavior modifications discussed and diet history reviewed.   Pt will continue to exercise regularly and modify diet with low GI, plant based foods and decrease intake of processed foods.  Recommend intake of daily multivitamin, Vitamin D, and calcium.  Recommend colonoscopy (up to date) for preventive screenings, as well as recommend immunizations that include influenza, TDAP, and Shingles - CMP14+EGFR  2. Encounter for prostate cancer screening - PSA  3. Class 3 severe obesity due to excess calories with serious comorbidity and body mass index (BMI) of 50.0 to 59.9 in adult [facility]) Chronic, I have discussed his weight gain and how this can affect his blood pressure.  Discussed healthy diet and regular exercise options  Encouraged to exercise at least 150 minutes per week with 2 days of strength training Will start Zepbound pending insurance approval she is to titrate weekly, discussed side effects of nausea, abdominal pain or difficulty swallowing to notify office. Zepbound teaching to be done once approved and picks up medication  Return in 2 months for weight check. - tirzepatide (ZEPBOUND) 2.5 MG/0.5ML Pen; Inject 2.5 mg into the skin once a week.  Dispense: 2 mL; Refill: 0 - Amb Ref to Medical Weight Management  4. Prediabetes Comments: Stable with diet control, encouraged to continue exercising at least 150 minutes/week - blood glucose meter kit and  supplies KIT; Dispense based on patient and insurance preference. Use daily as directed.  Dispense: 1 each; Refill: 0 - metFORMIN (GLUCOPHAGE) 500 MG tablet; Take 1 tablet (500 mg total) by mouth 2 (two) times daily with a meal.  Dispense: 60 tablet; Refill: 2  5. Mixed hyperlipidemia Comments: Cholesterol levels are stable. Continue current medications - atorvastatin (LIPITOR) 40 MG tablet; Take 1 tablet (40 mg total) by mouth at bedtime.  Dispense: 90 tablet; Refill: 1  6. Hypertension, unspecified type Comments: Blood pressure is fairly controlled, discussed life style modifications. He has had a signficant weight gain since his last visit.EKG done with no change from previous. HR 66 - EKG 12-Lead  7. Vitamin D deficiency Will check vitamin D level and supplement as needed.    Also encouraged to spend 15 minutes in the sun daily.  - VITAMIN D 25 Hydroxy (Vit-D Deficiency, Fractures)  8. Seasonal allergies Comments: Encouraged to make sure to take antihistamines preventatively due to upcoming allergy season - montelukast (SINGULAIR) 10 MG tablet; Take 1 tablet (10 mg total) by mouth daily.  Dispense: 30 tablet; Refill: 2  9. Gout of left foot, unspecified cause, unspecified chronicity Comments: No recent exacerbations - allopurinol (ZYLOPRIM) 300 MG tablet; Take 1 tablet (300 mg total) by mouth daily.  Dispense: 90 tablet; Refill: 1   Patient was given opportunity to ask questions. Patient verbalized understanding of the plan and was able to repeat key elements of the plan. All questions were answered to their satisfaction.   [name], FNP   I, [name], FNP, have reviewed all documentation for this  visit. The documentation on 03/03/23 for the exam, diagnosis, procedures, and orders are all accurate and complete.   THE PATIENT IS ENCOURAGED TO PRACTICE SOCIAL DISTANCING DUE TO THE COVID-19 PANDEMIC.

## 2023-03-04 ENCOUNTER — Encounter: Payer: Self-pay | Admitting: Nurse Practitioner

## 2023-03-04 LAB — CMP14+EGFR
ALT: 39 IU/L (ref 0–44)
AST: 35 IU/L (ref 0–40)
Albumin/Globulin Ratio: 1.5 (ref 1.2–2.2)
Albumin: 4.2 g/dL (ref 3.8–4.9)
Alkaline Phosphatase: 88 IU/L (ref 44–121)
BUN/Creatinine Ratio: 11 (ref 9–20)
BUN: 12 mg/dL (ref 6–24)
Bilirubin Total: 0.8 mg/dL (ref 0.0–1.2)
CO2: 22 mmol/L (ref 20–29)
Calcium: 9.5 mg/dL (ref 8.7–10.2)
Chloride: 102 mmol/L (ref 96–106)
Creatinine, Ser: 1.06 mg/dL (ref 0.76–1.27)
Globulin, Total: 2.8 g/dL (ref 1.5–4.5)
Glucose: 88 mg/dL (ref 70–99)
Potassium: 4.4 mmol/L (ref 3.5–5.2)
Sodium: 140 mmol/L (ref 134–144)
Total Protein: 7 g/dL (ref 6.0–8.5)
eGFR: 84 mL/min/{1.73_m2} (ref >59)

## 2023-03-04 LAB — VITAMIN D 25 HYDROXY (VIT D DEFICIENCY, FRACTURES): Vit D, 25-Hydroxy: 62.2 ng/mL (ref 30.0–100.0)

## 2023-03-04 LAB — PSA: Prostate Specific Ag, Serum: 0.6 ng/mL (ref 0.0–4.0)

## 2023-03-14 ENCOUNTER — Encounter: Payer: Self-pay | Admitting: Nurse Practitioner

## 2023-03-14 ENCOUNTER — Other Ambulatory Visit: Payer: Self-pay

## 2023-03-14 DIAGNOSIS — R7303 Prediabetes: Secondary | ICD-10-CM

## 2023-03-14 MED ORDER — BLOOD GLUCOSE MONITOR KIT: PACK | 0 refills | Status: DC

## 2023-03-15 ENCOUNTER — Other Ambulatory Visit: Payer: Self-pay

## 2023-03-15 DIAGNOSIS — R7303 Prediabetes: Secondary | ICD-10-CM

## 2023-03-15 MED ORDER — BLOOD GLUCOSE MONITORING SUPPL DEVI: 1.0000 | Freq: Three times a day (TID) | 0 refills | Status: AC

## 2023-03-15 MED ORDER — BLOOD GLUCOSE MONITOR KIT: 1.0000 | PACK | Freq: Three times a day (TID) | 0 refills | Status: DC

## 2023-03-15 MED ORDER — LANCET DEVICE MISC: 1.0000 | Freq: Three times a day (TID) | 2 refills | Status: AC

## 2023-03-15 MED ORDER — LANCETS MISC. MISC: 1.0000 | Freq: Three times a day (TID) | 2 refills | Status: DC

## 2023-03-15 MED ORDER — BLOOD GLUCOSE TEST VI STRP: 1.0000 | ORAL_STRIP | Freq: Three times a day (TID) | 2 refills | Status: DC

## 2023-05-20 ENCOUNTER — Other Ambulatory Visit: Payer: Self-pay | Admitting: Nurse Practitioner

## 2023-05-20 DIAGNOSIS — J302 Other seasonal allergic rhinitis: Secondary | ICD-10-CM

## 2023-05-31 ENCOUNTER — Other Ambulatory Visit: Payer: Self-pay | Admitting: Nurse Practitioner

## 2023-05-31 DIAGNOSIS — J302 Other seasonal allergic rhinitis: Secondary | ICD-10-CM

## 2023-06-04 ENCOUNTER — Other Ambulatory Visit: Payer: Self-pay | Admitting: Nurse Practitioner

## 2023-06-04 DIAGNOSIS — R7303 Prediabetes: Secondary | ICD-10-CM

## 2023-06-07 ENCOUNTER — Other Ambulatory Visit: Payer: Self-pay | Admitting: Nurse Practitioner

## 2023-06-07 DIAGNOSIS — J302 Other seasonal allergic rhinitis: Secondary | ICD-10-CM

## 2023-06-08 MED ORDER — MONTELUKAST SODIUM 10 MG PO TABS
10.0000 mg | ORAL_TABLET | Freq: Every day | ORAL | 0 refills | Status: DC
Start: 2023-06-08 — End: 2023-07-05

## 2023-06-15 ENCOUNTER — Other Ambulatory Visit: Payer: Self-pay | Admitting: Nurse Practitioner

## 2023-06-15 DIAGNOSIS — R7303 Prediabetes: Secondary | ICD-10-CM

## 2023-06-16 ENCOUNTER — Other Ambulatory Visit: Payer: Self-pay | Admitting: Nurse Practitioner

## 2023-06-21 LAB — HM DIABETES EYE EXAM

## 2023-07-05 ENCOUNTER — Ambulatory Visit: Payer: BC Managed Care – PPO | Admitting: Nurse Practitioner

## 2023-07-05 ENCOUNTER — Encounter: Payer: Self-pay | Admitting: Nurse Practitioner

## 2023-07-05 VITALS — BP 120/78 | HR 83 | Temp 98.4°F | Ht 68.0 in | Wt 339.0 lb

## 2023-07-05 DIAGNOSIS — E559 Vitamin D deficiency, unspecified: Secondary | ICD-10-CM

## 2023-07-05 DIAGNOSIS — Z6841 Body Mass Index (BMI) 40.0 and over, adult: Secondary | ICD-10-CM

## 2023-07-05 DIAGNOSIS — R7303 Prediabetes: Secondary | ICD-10-CM | POA: Diagnosis not present

## 2023-07-05 DIAGNOSIS — J302 Other seasonal allergic rhinitis: Secondary | ICD-10-CM

## 2023-07-05 DIAGNOSIS — M79671 Pain in right foot: Secondary | ICD-10-CM

## 2023-07-05 DIAGNOSIS — E782 Mixed hyperlipidemia: Secondary | ICD-10-CM | POA: Diagnosis not present

## 2023-07-05 DIAGNOSIS — I10 Essential (primary) hypertension: Secondary | ICD-10-CM

## 2023-07-05 DIAGNOSIS — Z79899 Other long term (current) drug therapy: Secondary | ICD-10-CM

## 2023-07-05 MED ORDER — MONTELUKAST SODIUM 10 MG PO TABS: 10.0000 mg | ORAL_TABLET | Freq: Every day | ORAL | 1 refills | Status: DC

## 2023-07-05 NOTE — Progress Notes (Signed)
I,Jameka J Llittleton, CMA,acting as a Neurosurgeon for [employer], FNP.,have documented all relevant documentation on the behalf of [name], FNP,as directed by  [name], FNP while in the presence of [name], FNP.  Subjective:  Patient ID: Alex Harrison , male    DOB: [birth year] , 52 y.o.   MRN: [id number]  No chief complaint on file.   HPI  Patient presents today for bp and prediabetes check. Patient reports compliance with his meds.continues to be followed by ENT at [facility]. He is not walking as much.      Past Medical History:  Diagnosis Date   Allergic rhinitis    Allergy    seasonal allergic rhinitis   Diabetes mellitus without complication (HCC)    Dizziness    GERD (gastroesophageal reflux disease)    past hx    Gout    Hyperlipidemia    Hypertension    Left arm pain    Left elbow pain    Macular degeneration    shots every 3 months    Morbid obesity (HCC)    Neck nodule 09/16/2010   right      Family History  Problem Relation Age of Onset   Hypertension Mother    Diabetes Mother    Stroke Mother    Hypertension Father    Diabetes Father    Diabetes Sister    Hypertension Sister    Colon cancer Neg Hx    Colon polyps Neg Hx    Esophageal cancer Neg Hx    Rectal cancer Neg Hx    Stomach cancer Neg Hx      Current Outpatient Medications:    ACCU-CHEK GUIDE test strip, CHECK BLOOD SUGAR IN THE MORNING, AT NOON, AND AT BEDTIME, Disp: 100 each, Rfl: 0   Accu-Chek Softclix Lancets lancets, CHECK BLOOD SUGAR IN THE MORNING, AT NOON, AND AT BEDTIME, Disp: 100 each, Rfl: 0   allopurinol (ZYLOPRIM) 300 MG tablet, Take 1 tablet (300 mg total) by mouth daily., Disp: 90 tablet, Rfl: 1   atorvastatin (LIPITOR) 40 MG tablet, Take 1 tablet (40 mg total) by mouth at bedtime., Disp: 90 tablet, Rfl: 1   azelastine (ASTELIN) 0.1 % nasal spray, Place 2 sprays into both nostrils 2 (two) times daily. Use in each nostril as directed, Disp: 30 mL, Rfl: 5   Blood  Glucose Monitoring Suppl DEVI, 1 each by Does not apply route in the morning, at noon, and at bedtime. May substitute to any manufacturer covered by patient's insurance., Disp: 1 each, Rfl: 0   Calcium Carbonate (CALCIUM 600 PO), Take 1 tablet by mouth daily at 12 noon., Disp: , Rfl:    Cholecalciferol (VITAMIN D-3) 5000 UNITS TABS, Take by mouth., Disp: , Rfl:    Lancet Device MISC, 1 each by Does not apply route in the morning, at noon, and at bedtime. May substitute to any manufacturer covered by patient's insurance., Disp: 1 each, Rfl: 2   Lancets Misc. MISC, 1 each by Does not apply route in the morning, at noon, and at bedtime. May substitute to any manufacturer covered by patient's insurance., Disp: 100 each, Rfl: 2   levocetirizine (XYZAL) 5 MG tablet, TAKE 1 TABLET BY MOUTH ONCE DAILY IN THE EVENING, Disp: 90 tablet, Rfl: 0   MAGNESIUM GLUCONATE PO, Take 400 mg by mouth daily., Disp: , Rfl:    meclizine (ANTIVERT) 25 MG tablet, Take 1 tablet by mouth three times daily as needed, Disp: 90 tablet, Rfl: 0  metFORMIN (GLUCOPHAGE) 500 MG tablet, TAKE 1 TABLET BY MOUTH TWICE DAILY WITH A MEAL, Disp: 60 tablet, Rfl: 0   Omega-3 Fatty Acids (FISH OIL PO), Take by mouth. 1 per day, Disp: , Rfl:    PROCTO-MED HC 2.5 % rectal cream, PLACE 1 APPLICATORFUL RECTALLY TWICE DAILY, Disp: 28 g, Rfl: 0   triamterene-hydrochlorothiazide (DYAZIDE) 37.5-25 MG capsule, Take 1 capsule by mouth daily., Disp: , Rfl:    montelukast (SINGULAIR) 10 MG tablet, Take 1 tablet (10 mg total) by mouth daily., Disp: 90 tablet, Rfl: 1   Allergies  Allergen Reactions   Niaspan [Niacin Er]     Skin burning and itching and painful to touch.     Review of Systems  Constitutional: Negative.   Eyes: Negative.   Respiratory: Negative.    Cardiovascular: Negative.   Endocrine: Negative for polydipsia, polyphagia and polyuria.  Musculoskeletal: Negative.   Skin: Negative.   Neurological: Negative.   Psychiatric/Behavioral:  Negative.       Today's Vitals   07/05/23 1111  BP: 120/78  Pulse: 83  Temp: 98.4 F (36.9 C)  Weight: (!) 339 lb (153.8 kg)  Height: 5\' 8"  (1.727 m)  PainSc: 10-Worst pain ever  PainLoc: Foot   Body mass index is 51.54 kg/m.  Wt Readings from Last 3 Encounters:  07/05/23 (!) 339 lb (153.8 kg)  03/03/23 (!) 345 lb 6.4 oz (156.7 kg)  04/13/22 (!) 305 lb (138.3 kg)    The ASCVD Risk score (Arnett DK, et al., 2019) failed to calculate for the following reasons:   The valid total cholesterol range is 130 to 320 mg/dL  Objective:  Physical Exam Vitals reviewed.  Constitutional:      General: He is not in acute distress.    Appearance: Normal appearance. He is obese.  Cardiovascular:     Rate and Rhythm: Normal rate and regular rhythm.     Pulses: Normal pulses.     Heart sounds: Normal heart sounds. No murmur heard. Pulmonary:     Effort: Pulmonary effort is normal. No respiratory distress.     Breath sounds: Normal breath sounds. No wheezing.  Musculoskeletal:        General: Tenderness (right heel) present. Normal range of motion.     Comments: Pes planus bilateral heels  Skin:    General: Skin is warm and dry.  Neurological:     General: No focal deficit present.     Mental Status: He is alert and oriented to person, place, and time.     Cranial Nerves: No cranial nerve deficit.     Motor: No weakness.  Psychiatric:        Mood and Affect: Mood normal.        Behavior: Behavior normal.        Thought Content: Thought content normal.        Judgment: Judgment normal.         Assessment And Plan:  Hypertension, unspecified type Assessment & Plan: B/P is well controlled.  CMP ordered to check renal function.  The importance of regular exercise and dietary modification was stressed to the patient.  Stressed importance of losing ten percent of her body weight to help with B/P control.  The weight loss would help with decreasing cardiac and cancer risk as well.    Orders: -     Basic metabolic panel  Mixed hyperlipidemia Assessment & Plan: Cholesterol levels have been stable.  Continue statin tolerating well.  Orders: -  Lipid panel  Vitamin D deficiency Assessment & Plan: Will check vitamin D level and supplement as needed.    Also encouraged to spend 15 minutes in the sun daily.    Orders: -     VITAMIN D 25 Hydroxy (Vit-D Deficiency, Fractures)  Prediabetes Assessment & Plan: Chronic, stable. Continue metformin tolerating well  Orders: -     Hemoglobin A1c  Seasonal allergies -     Montelukast Sodium; Take 1 tablet (10 mg total) by mouth daily.  Dispense: 90 tablet; Refill: 1  Class 3 severe obesity due to excess calories with serious comorbidity and body mass index (BMI) of 50.0 to 59.9 in adult [facility]) Assessment & Plan: Chronic Discussed healthy diet and regular exercise options  Encouraged to exercise at least 150 minutes per week with 2 days of strength training Long discussion about being consistent with regular exercise and eating healthy diet.     Pain of right heel Assessment & Plan: Tenderness to heel, will refer to podiatrist. He does have pes planus  Orders: -     Ambulatory referral to Podiatry  Other long term (current) drug therapy -     CBC    Return in about 3 months (around 10/05/2023) for bp check.  Patient was given opportunity to ask questions. Patient verbalized understanding of the plan and was able to repeat key elements of the plan. All questions were answered to their satisfaction.    Jeanell Sparrow, FNP, have reviewed all documentation for this visit. The documentation on 07/05/23 for the exam, diagnosis, procedures, and orders are all accurate and complete.   IF YOU HAVE BEEN REFERRED TO A SPECIALIST, IT MAY TAKE 1-2 WEEKS TO SCHEDULE/PROCESS THE REFERRAL. IF YOU HAVE NOT HEARD FROM US/SPECIALIST IN TWO WEEKS, PLEASE GIVE [place] A CALL AT [phone].

## 2023-07-06 LAB — LIPID PANEL
Chol/HDL Ratio: 3.6 ratio (ref 0.0–5.0)
Cholesterol, Total: 113 mg/dL (ref 100–199)
HDL: 31 mg/dL — ABNORMAL LOW (ref >39)
LDL Chol Calc (NIH): 66 mg/dL (ref 0–99)
Triglycerides: 82 mg/dL (ref 0–149)
VLDL Cholesterol Cal: 16 mg/dL (ref 5–40)

## 2023-07-06 LAB — BASIC METABOLIC PANEL
BUN/Creatinine Ratio: 14 (ref 9–20)
BUN: 14 mg/dL (ref 6–24)
CO2: 26 mmol/L (ref 20–29)
Calcium: 9.6 mg/dL (ref 8.7–10.2)
Chloride: 103 mmol/L (ref 96–106)
Creatinine, Ser: 0.98 mg/dL (ref 0.76–1.27)
Glucose: 81 mg/dL (ref 70–99)
Potassium: 4.5 mmol/L (ref 3.5–5.2)
Sodium: 142 mmol/L (ref 134–144)
eGFR: 92 mL/min/{1.73_m2} (ref >59)

## 2023-07-06 LAB — CBC
Hematocrit: 43.3 % (ref 37.5–51.0)
Hemoglobin: 14.4 g/dL (ref 13.0–17.7)
MCH: 30.8 pg (ref 26.6–33.0)
MCHC: 33.3 g/dL (ref 31.5–35.7)
MCV: 93 fL (ref 79–97)
Platelets: 259 10*3/uL (ref 150–450)
RBC: 4.68 x10E6/uL (ref 4.14–5.80)
RDW: 13.1 % (ref 11.6–15.4)
WBC: 5.7 10*3/uL (ref 3.4–10.8)

## 2023-07-06 LAB — HEMOGLOBIN A1C
Est. average glucose Bld gHb Est-mCnc: 128 mg/dL
Hgb A1c MFr Bld: 6.1 % — ABNORMAL HIGH (ref 4.8–5.6)

## 2023-07-06 LAB — VITAMIN D 25 HYDROXY (VIT D DEFICIENCY, FRACTURES): Vit D, 25-Hydroxy: 60.9 ng/mL (ref 30.0–100.0)

## 2023-07-13 MED ORDER — METFORMIN HCL 500 MG PO TABS
500.0000 mg | ORAL_TABLET | Freq: Two times a day (BID) | ORAL | 1 refills | Status: DC
Start: 2023-07-13 — End: 2024-01-09

## 2023-07-13 NOTE — Assessment & Plan Note (Signed)
Will check vitamin D level and supplement as needed.    Also encouraged to spend 15 minutes in the sun daily.   

## 2023-07-13 NOTE — Assessment & Plan Note (Signed)
Chronic Discussed healthy diet and regular exercise options  Encouraged to exercise at least 150 minutes per week with 2 days of strength training Long discussion about being consistent with regular exercise and eating healthy diet.

## 2023-07-13 NOTE — Assessment & Plan Note (Signed)
Chronic, stable. Continue metformin tolerating well

## 2023-07-13 NOTE — Assessment & Plan Note (Addendum)
Cholesterol levels have been stable.  Continue statin tolerating well.

## 2023-07-13 NOTE — Assessment & Plan Note (Signed)
B/P is well controlled.  CMP ordered to check renal function.  The importance of regular exercise and dietary modification was stressed to the patient.  Stressed importance of losing ten percent of her body weight to help with B/P control.  The weight loss would help with decreasing cardiac and cancer risk as well.

## 2023-07-13 NOTE — Assessment & Plan Note (Signed)
Tenderness to heel, will refer to podiatrist. He does have pes planus

## 2023-07-15 ENCOUNTER — Other Ambulatory Visit: Payer: Self-pay | Admitting: Podiatry

## 2023-07-15 ENCOUNTER — Ambulatory Visit (INDEPENDENT_AMBULATORY_CARE_PROVIDER_SITE_OTHER): Payer: BC Managed Care – PPO

## 2023-07-15 ENCOUNTER — Ambulatory Visit: Payer: BC Managed Care – PPO | Admitting: Podiatry

## 2023-07-15 ENCOUNTER — Encounter: Payer: Self-pay | Admitting: Podiatry

## 2023-07-15 DIAGNOSIS — M722 Plantar fascial fibromatosis: Secondary | ICD-10-CM

## 2023-07-15 MED ORDER — TRIAMCINOLONE ACETONIDE 10 MG/ML IJ SUSP
10.0000 mg | Freq: Once | INTRAMUSCULAR | Status: AC
Start: 2023-07-15 — End: 2023-07-15
  Administered 2023-07-15: 10 mg via INTRA_ARTICULAR

## 2023-07-15 NOTE — Patient Instructions (Signed)

## 2023-07-16 NOTE — Progress Notes (Signed)
Subjective:   Patient ID: Alex Harrison, male   DOB: 52 y.o.   MRN: [id number]   HPI Patient presents stating he is developed a lot of pain in his heel over the last few months and did have a history of this in the past and has obesity complicating factor   ROS      Objective:  Physical Exam  Neurovascular status intact with exquisite discomfort in the plantar heel region right over left inflammation fluid around the medial band painful when pressed      Assessment:  Acute plantar fasciitis bilateral right over left painful when pressed     Plan:  H&P reviewed condition went ahead today did sterile prep injected the fascia at insertion 3 mg Kenalog 5 mg Xylocaine advised on anti-inflammatories and physical therapy reappoint to recheck as needed and placed on diclofenac 75 mg twice daily  X-rays indicate significant spur formation bilateral heel

## 2023-07-18 ENCOUNTER — Encounter: Payer: Self-pay | Admitting: Podiatry

## 2023-07-19 ENCOUNTER — Telehealth: Payer: Self-pay | Admitting: Podiatry

## 2023-07-19 ENCOUNTER — Other Ambulatory Visit (INDEPENDENT_AMBULATORY_CARE_PROVIDER_SITE_OTHER): Payer: BC Managed Care – PPO | Admitting: Podiatry

## 2023-07-19 MED ORDER — DICLOFENAC SODIUM 75 MG PO TBEC: 75.0000 mg | DELAYED_RELEASE_TABLET | Freq: Two times a day (BID) | ORAL | 0 refills | Status: DC

## 2023-07-19 NOTE — Progress Notes (Signed)
Rx for diclofenac 75 mg BID sent per pt request

## 2023-07-19 NOTE — Telephone Encounter (Signed)
Thanks

## 2023-07-19 NOTE — Telephone Encounter (Signed)
Pt stated that the pharmacy has not received his Rx for plantar fasciitis. Please advise

## 2023-08-24 ENCOUNTER — Other Ambulatory Visit: Payer: Self-pay | Admitting: Nurse Practitioner

## 2023-08-24 DIAGNOSIS — E782 Mixed hyperlipidemia: Secondary | ICD-10-CM

## 2023-09-01 ENCOUNTER — Other Ambulatory Visit: Payer: Self-pay | Admitting: Nurse Practitioner

## 2023-09-01 DIAGNOSIS — J302 Other seasonal allergic rhinitis: Secondary | ICD-10-CM

## 2023-09-20 ENCOUNTER — Other Ambulatory Visit: Payer: Self-pay | Admitting: Nurse Practitioner

## 2023-09-28 LAB — HM DIABETES EYE EXAM

## 2023-10-10 ENCOUNTER — Ambulatory Visit: Payer: BC Managed Care – PPO | Admitting: Nurse Practitioner

## 2023-10-31 ENCOUNTER — Encounter: Payer: Self-pay | Admitting: Nurse Practitioner

## 2023-10-31 ENCOUNTER — Ambulatory Visit: Payer: BC Managed Care – PPO | Admitting: Nurse Practitioner

## 2023-10-31 VITALS — BP 110/70 | HR 72 | Temp 98.5°F | Ht 68.0 in | Wt 345.0 lb

## 2023-10-31 DIAGNOSIS — E782 Mixed hyperlipidemia: Secondary | ICD-10-CM

## 2023-10-31 DIAGNOSIS — Z23 Encounter for immunization: Secondary | ICD-10-CM | POA: Diagnosis not present

## 2023-10-31 DIAGNOSIS — I10 Essential (primary) hypertension: Secondary | ICD-10-CM

## 2023-10-31 DIAGNOSIS — R058 Other specified cough: Secondary | ICD-10-CM | POA: Insufficient documentation

## 2023-10-31 DIAGNOSIS — Z6841 Body Mass Index (BMI) 40.0 and over, adult: Secondary | ICD-10-CM

## 2023-10-31 DIAGNOSIS — E66813 Obesity, class 3: Secondary | ICD-10-CM

## 2023-10-31 DIAGNOSIS — E559 Vitamin D deficiency, unspecified: Secondary | ICD-10-CM | POA: Diagnosis not present

## 2023-10-31 DIAGNOSIS — R7303 Prediabetes: Secondary | ICD-10-CM | POA: Diagnosis not present

## 2023-10-31 NOTE — Assessment & Plan Note (Addendum)
Chronic Discussed healthy diet and regular exercise options  Encouraged to exercise at least 150 minutes per week with 2 days of strength training as tolerated, consider getting a pedaler or recumbent bike to exercise due to his bilateral foot pain Long discussion about being consistent with regular exercise and eating healthy diet.  Will refer to [facility] to help with his diet. His insurance currently is not covering a GLP1

## 2023-10-31 NOTE — Assessment & Plan Note (Signed)
Chronic, stable. Continue metformin tolerating well. Discussed once A1c above 6.4 this is considered diabetes

## 2023-10-31 NOTE — Progress Notes (Signed)
Madelaine Bhat, CMA,acting as a Neurosurgeon for [name], FNP.,have documented all relevant documentation on the behalf of [name], FNP,as directed by  [name], FNP while in the presence of [name], FNP.  Subjective:  Patient ID: Alex Harrison , male    DOB: Feb 03, 1971 , 52 y.o.   MRN: [id number]  Chief Complaint  Patient presents with   Hypertension    HPI  Patient presents today for a bp and pre dm follow up, Patient reports compliance with medication. Patient denies any chest pain, SOB, or headaches. Patient has no concerns today.  He continues have heel pain but has not returned to the podiatrist.      Past Medical History:  Diagnosis Date   Allergic rhinitis    Allergy    seasonal allergic rhinitis   Diabetes mellitus without complication (HCC)    Dizziness    GERD (gastroesophageal reflux disease)    past hx    Gout    Hyperlipidemia    Hypertension    Left arm pain    Left elbow pain    Macular degeneration    shots every 3 months    Morbid obesity (HCC)    Neck nodule 09/16/2010   right      Family History  Problem Relation Age of Onset   Hypertension Mother    Diabetes Mother    Stroke Mother    Hypertension Father    Diabetes Father    Diabetes Sister    Hypertension Sister    Colon cancer Neg Hx    Colon polyps Neg Hx    Esophageal cancer Neg Hx    Rectal cancer Neg Hx    Stomach cancer Neg Hx      Current Outpatient Medications:    ACCU-CHEK GUIDE test strip, CHECK BLOOD SUGAR IN THE MORNING, AT NOON, AND AT BEDTIME, Disp: 100 each, Rfl: 0   Accu-Chek Softclix Lancets lancets, CHECK BLOOD SUGAR IN THE MORNING, AT NOON, AND AT BEDTIME, Disp: 100 each, Rfl: 0   allopurinol (ZYLOPRIM) 300 MG tablet, Take 1 tablet (300 mg total) by mouth daily., Disp: 90 tablet, Rfl: 1   atorvastatin (LIPITOR) 40 MG tablet, TAKE 1 TABLET BY MOUTH AT BEDTIME, Disp: 90 tablet, Rfl: 0   azelastine (ASTELIN) 0.1 % nasal spray, Place 2 sprays into both  nostrils 2 (two) times daily. Use in each nostril as directed, Disp: 30 mL, Rfl: 5   Blood Glucose Monitoring Suppl DEVI, 1 each by Does not apply route in the morning, at noon, and at bedtime. May substitute to any manufacturer covered by patient's insurance., Disp: 1 each, Rfl: 0   Calcium Carbonate (CALCIUM 600 PO), Take 1 tablet by mouth daily at 12 noon., Disp: , Rfl:    Cholecalciferol (VITAMIN D-3) 5000 UNITS TABS, Take by mouth., Disp: , Rfl:    diclofenac (VOLTAREN) 75 MG EC tablet, Take 1 tablet (75 mg total) by mouth 2 (two) times daily., Disp: 30 tablet, Rfl: 0   Lancet Device MISC, 1 each by Does not apply route in the morning, at noon, and at bedtime. May substitute to any manufacturer covered by patient's insurance., Disp: 1 each, Rfl: 2   Lancets Misc. MISC, 1 each by Does not apply route in the morning, at noon, and at bedtime. May substitute to any manufacturer covered by patient's insurance., Disp: 100 each, Rfl: 2   levocetirizine (XYZAL) 5 MG tablet, TAKE 1 TABLET BY MOUTH ONCE DAILY IN THE EVENING, Disp:  90 tablet, Rfl: 0   MAGNESIUM GLUCONATE PO, Take 400 mg by mouth daily., Disp: , Rfl:    meclizine (ANTIVERT) 25 MG tablet, Take 1 tablet by mouth three times daily as needed, Disp: 90 tablet, Rfl: 0   metFORMIN (GLUCOPHAGE) 500 MG tablet, Take 1 tablet (500 mg total) by mouth 2 (two) times daily with a meal., Disp: 180 tablet, Rfl: 1   montelukast (SINGULAIR) 10 MG tablet, Take 1 tablet (10 mg total) by mouth daily., Disp: 90 tablet, Rfl: 1   Omega-3 Fatty Acids (FISH OIL PO), Take by mouth. 1 per day, Disp: , Rfl:    PROCTO-MED HC 2.5 % rectal cream, PLACE 1 APPLICATORFUL RECTALLY TWICE DAILY, Disp: 28 g, Rfl: 0   triamterene-hydrochlorothiazide (DYAZIDE) 37.5-25 MG capsule, Take 1 capsule by mouth daily., Disp: , Rfl:    Allergies  Allergen Reactions   Niaspan [Niacin Er (Antihyperlipidemic)]     Skin burning and itching and painful to touch.     Review of Systems   Constitutional: Negative.   HENT: Negative.    Eyes: Negative.   Respiratory: Negative.    Cardiovascular: Negative.   Gastrointestinal: Negative.   Musculoskeletal:        Bilateral foot pain  Neurological: Negative.   Psychiatric/Behavioral: Negative.       Today's Vitals   10/31/23 0826  BP: 110/70  Pulse: 72  Temp: 98.5 F (36.9 C)  TempSrc: Oral  Weight: (!) 345 lb (156.5 kg)  Height: 5\' 8"  (1.727 m)  PainSc: 0-No pain   Body mass index is 52.46 kg/m.  Wt Readings from Last 3 Encounters:  10/31/23 (!) 345 lb (156.5 kg)  07/05/23 (!) 339 lb (153.8 kg)  03/03/23 (!) 345 lb 6.4 oz (156.7 kg)     Objective:  Physical Exam Vitals reviewed.  Constitutional:      General: He is not in acute distress.    Appearance: Normal appearance. He is obese.  Cardiovascular:     Rate and Rhythm: Normal rate and regular rhythm.     Pulses: Normal pulses.     Heart sounds: Normal heart sounds. No murmur heard. Pulmonary:     Effort: Pulmonary effort is normal. No respiratory distress.     Breath sounds: Normal breath sounds. No wheezing.  Musculoskeletal:        General: No swelling or tenderness. Normal range of motion.  Skin:    General: Skin is warm and dry.  Neurological:     General: No focal deficit present.     Mental Status: He is alert and oriented to person, place, and time.     Cranial Nerves: No cranial nerve deficit.     Motor: No weakness.  Psychiatric:        Mood and Affect: Mood normal.        Behavior: Behavior normal.        Thought Content: Thought content normal.        Judgment: Judgment normal.         Assessment And Plan:  Hypertension, unspecified type Assessment & Plan: B/P is well controlled.  Continue current medications.   Orders: -     Basic metabolic panel  Prediabetes Assessment & Plan: Chronic, stable. Continue metformin tolerating well. Discussed once A1c above 6.4 this is considered diabetes  Orders: -     Hemoglobin  A1c  Mixed hyperlipidemia Assessment & Plan: Cholesterol levels have been stable.  Continue statin tolerating well.  Orders: -  Lipid panel  Vitamin D deficiency Assessment & Plan: Will check vitamin D level and supplement as needed.    Also encouraged to spend 15 minutes in the sun daily.     Need for influenza vaccination Assessment & Plan: Influenza vaccine administered Encouraged to take Tylenol as needed for fever or muscle aches.   Orders: -     Flu vaccine trivalent PF, 6mos and older(Flulaval,Afluria,Fluarix,Fluzone)  Class 3 severe obesity due to excess calories with serious comorbidity and body mass index (BMI) of 50.0 to 59.9 in adult [facility]) Assessment & Plan: Chronic Discussed healthy diet and regular exercise options  Encouraged to exercise at least 150 minutes per week with 2 days of strength training as tolerated, consider getting a pedaler or recumbent bike to exercise due to his bilateral foot pain Long discussion about being consistent with regular exercise and eating healthy diet.  Will refer to [facility] to help with his diet. His insurance currently is not covering a GLP1   Orders: -     Amb Ref to [facility] Weight [facility]  Nocturnal cough Assessment & Plan: Advised him to take zyrtec at bedtime. Will also order a home sleep study with [employer] to see if his insurance will cover. His previous order he reports the cost was too high.   Orders: -     Brain natriuretic peptide    Return for 6 month bp check.   Patient was given opportunity to ask questions. Patient verbalized understanding of the plan and was able to repeat key elements of the plan. All questions were answered to their satisfaction.    Jeanell Sparrow, FNP, have reviewed all documentation for this visit. The documentation on 10/31/23 for the exam, diagnosis, procedures, and orders are all accurate and complete.   IF YOU HAVE BEEN REFERRED TO A SPECIALIST, IT MAY  TAKE 1-2 WEEKS TO SCHEDULE/PROCESS THE REFERRAL. IF YOU HAVE NOT HEARD FROM US/SPECIALIST IN TWO WEEKS, PLEASE GIVE [place] A CALL AT [phone].

## 2023-10-31 NOTE — Assessment & Plan Note (Signed)
Advised him to take zyrtec at bedtime. Will also order a home sleep study with [employer] to see if his insurance will cover. His previous order he reports the cost was too high.

## 2023-10-31 NOTE — Assessment & Plan Note (Signed)
BP is well controlled Continue current medications

## 2023-10-31 NOTE — Assessment & Plan Note (Signed)
Influenza vaccine administered Encouraged to take Tylenol as needed for fever or muscle aches.

## 2023-10-31 NOTE — Assessment & Plan Note (Signed)
Cholesterol levels have been stable.  Continue statin tolerating well.

## 2023-10-31 NOTE — Assessment & Plan Note (Signed)
Will check vitamin D level and supplement as needed.    Also encouraged to spend 15 minutes in the sun daily.   

## 2023-11-01 LAB — BASIC METABOLIC PANEL
BUN/Creatinine Ratio: 11 (ref 9–20)
BUN: 10 mg/dL (ref 6–24)
CO2: 24 mmol/L (ref 20–29)
Calcium: 9.3 mg/dL (ref 8.7–10.2)
Chloride: 106 mmol/L (ref 96–106)
Creatinine, Ser: 0.92 mg/dL (ref 0.76–1.27)
Glucose: 90 mg/dL (ref 70–99)
Potassium: 4.4 mmol/L (ref 3.5–5.2)
Sodium: 143 mmol/L (ref 134–144)
eGFR: 99 mL/min/{1.73_m2} (ref >59)

## 2023-11-01 LAB — HEMOGLOBIN A1C
Est. average glucose Bld gHb Est-mCnc: 134 mg/dL
Hgb A1c MFr Bld: 6.3 % — ABNORMAL HIGH (ref 4.8–5.6)

## 2023-11-01 LAB — BRAIN NATRIURETIC PEPTIDE: BNP: 18.4 pg/mL (ref 0.0–100.0)

## 2023-11-01 LAB — LIPID PANEL
Chol/HDL Ratio: 3.5 ratio (ref 0.0–5.0)
Cholesterol, Total: 130 mg/dL (ref 100–199)
HDL: 37 mg/dL — ABNORMAL LOW (ref >39)
LDL Chol Calc (NIH): 79 mg/dL (ref 0–99)
Triglycerides: 68 mg/dL (ref 0–149)
VLDL Cholesterol Cal: 14 mg/dL (ref 5–40)

## 2023-11-20 ENCOUNTER — Other Ambulatory Visit: Payer: Self-pay | Admitting: Nurse Practitioner

## 2023-11-20 DIAGNOSIS — M109 Gout, unspecified: Secondary | ICD-10-CM

## 2023-11-20 DIAGNOSIS — E782 Mixed hyperlipidemia: Secondary | ICD-10-CM

## 2023-11-29 ENCOUNTER — Other Ambulatory Visit: Payer: Self-pay | Admitting: Nurse Practitioner

## 2023-11-29 DIAGNOSIS — J302 Other seasonal allergic rhinitis: Secondary | ICD-10-CM

## 2023-11-30 ENCOUNTER — Encounter: Payer: Self-pay | Admitting: Nurse Practitioner

## 2023-11-30 DIAGNOSIS — H34831 Tributary (branch) retinal vein occlusion, right eye, with macular edema: Secondary | ICD-10-CM | POA: Insufficient documentation

## 2023-11-30 DIAGNOSIS — H401110 Primary open-angle glaucoma, right eye, stage unspecified: Secondary | ICD-10-CM | POA: Insufficient documentation

## 2023-11-30 DIAGNOSIS — H40051 Ocular hypertension, right eye: Secondary | ICD-10-CM | POA: Insufficient documentation

## 2023-11-30 DIAGNOSIS — H2512 Age-related nuclear cataract, left eye: Secondary | ICD-10-CM | POA: Insufficient documentation

## 2023-12-03 ENCOUNTER — Other Ambulatory Visit: Payer: Self-pay | Admitting: Nurse Practitioner

## 2024-01-08 ENCOUNTER — Other Ambulatory Visit: Payer: Self-pay | Admitting: Nurse Practitioner

## 2024-01-08 DIAGNOSIS — R7303 Prediabetes: Secondary | ICD-10-CM

## 2024-01-27 ENCOUNTER — Other Ambulatory Visit: Payer: Self-pay | Admitting: Nurse Practitioner

## 2024-01-27 DIAGNOSIS — J302 Other seasonal allergic rhinitis: Secondary | ICD-10-CM

## 2024-03-07 ENCOUNTER — Other Ambulatory Visit: Payer: Self-pay | Admitting: Nurse Practitioner

## 2024-04-06 ENCOUNTER — Other Ambulatory Visit: Payer: Self-pay | Admitting: Nurse Practitioner

## 2024-04-06 DIAGNOSIS — R7303 Prediabetes: Secondary | ICD-10-CM

## 2024-04-06 DIAGNOSIS — J302 Other seasonal allergic rhinitis: Secondary | ICD-10-CM

## 2024-04-29 ENCOUNTER — Other Ambulatory Visit: Payer: Self-pay | Admitting: Nurse Practitioner

## 2024-04-29 DIAGNOSIS — J302 Other seasonal allergic rhinitis: Secondary | ICD-10-CM

## 2024-04-30 ENCOUNTER — Encounter: Payer: Self-pay | Admitting: Nurse Practitioner

## 2024-04-30 ENCOUNTER — Ambulatory Visit: Payer: BC Managed Care – PPO | Admitting: Nurse Practitioner

## 2024-04-30 VITALS — BP 116/80 | HR 87 | Temp 98.2°F | Ht 68.0 in | Wt 357.2 lb

## 2024-04-30 DIAGNOSIS — E782 Mixed hyperlipidemia: Secondary | ICD-10-CM | POA: Diagnosis not present

## 2024-04-30 DIAGNOSIS — I10 Essential (primary) hypertension: Secondary | ICD-10-CM | POA: Diagnosis not present

## 2024-04-30 DIAGNOSIS — R0683 Snoring: Secondary | ICD-10-CM | POA: Insufficient documentation

## 2024-04-30 DIAGNOSIS — Z6841 Body Mass Index (BMI) 40.0 and over, adult: Secondary | ICD-10-CM

## 2024-04-30 DIAGNOSIS — E66813 Obesity, class 3: Secondary | ICD-10-CM

## 2024-04-30 DIAGNOSIS — R7303 Prediabetes: Secondary | ICD-10-CM

## 2024-04-30 NOTE — Assessment & Plan Note (Signed)
Cholesterol levels have been stable.  Continue statin tolerating well.

## 2024-04-30 NOTE — Patient Instructions (Addendum)
 Hypertension, Adult Hypertension is another name for high blood pressure. High blood pressure forces your heart to work harder to pump blood. This can cause problems over time. There are two numbers in a blood pressure reading. There is a top number (systolic) over a bottom number (diastolic). It is best to have a blood pressure that is below 120/80. What are the causes? The cause of this condition is not known. Some other conditions can lead to high blood pressure. What increases the risk? Some lifestyle factors can make you more likely to develop high blood pressure: Smoking. Not getting enough exercise or physical activity. Being overweight. Having too much fat, sugar, calories, or salt (sodium) in your diet. Drinking too much alcohol. Other risk factors include: Having any of these conditions: Heart disease. Diabetes. High cholesterol. Kidney disease. Obstructive sleep apnea. Having a family history of high blood pressure and high cholesterol. Age. The risk increases with age. Stress. What are the signs or symptoms? High blood pressure may not cause symptoms. Very high blood pressure (hypertensive crisis) may cause: Headache. Fast or uneven heartbeats (palpitations). Shortness of breath. Nosebleed. Vomiting or feeling like you may vomit (nauseous). Changes in how you see. Very bad chest pain. Feeling dizzy. Seizures. How is this treated? This condition is treated by making healthy lifestyle changes, such as: Eating healthy foods. Exercising more. Drinking less alcohol. Your doctor may prescribe medicine if lifestyle changes do not help enough and if: Your top number is above 130. Your bottom number is above 80. Your personal target blood pressure may vary. Follow these instructions at home: Eating and drinking  If told, follow the DASH eating plan. To follow this plan: Fill one half of your plate at each meal with fruits and vegetables. Fill one fourth of your plate  at each meal with whole grains. Whole grains include whole-wheat pasta, brown rice, and whole-grain bread. Eat or drink low-fat dairy products, such as skim milk or low-fat yogurt. Fill one fourth of your plate at each meal with low-fat (lean) proteins. Low-fat proteins include fish, chicken without skin, eggs, beans, and tofu. Avoid fatty meat, cured and processed meat, or chicken with skin. Avoid pre-made or processed food. Limit the amount of salt in your diet to less than 1,500 mg each day. Do not drink alcohol if: Your doctor tells you not to drink. You are pregnant, may be pregnant, or are planning to become pregnant. If you drink alcohol: Limit how much you have to: 0-1 drink a day for women. 0-2 drinks a day for men. Know how much alcohol is in your drink. In the [place] one drink equals one 12 oz bottle of beer (355 mL), one 5 oz glass of wine (148 mL), or one 1 oz glass of hard liquor (44 mL). Lifestyle  Work with your doctor to stay at a healthy weight or to lose weight. Ask your doctor what the best weight is for you. Get at least 30 minutes of exercise that causes your heart to beat faster (aerobic exercise) most days of the week. This may include walking, swimming, or biking. Get at least 30 minutes of exercise that strengthens your muscles (resistance exercise) at least 3 days a week. This may include lifting weights or doing Pilates. Do not smoke or use any products that contain nicotine or tobacco. If you need help quitting, ask your doctor. Check your blood pressure at home as told by your doctor. Keep all follow-up visits. Medicines Take over-the-counter and prescription medicines  only as told by your doctor. Follow directions carefully. Do not skip doses of blood pressure medicine. The medicine does not work as well if you skip doses. Skipping doses also puts you at risk for problems. Ask your doctor about side effects or reactions to medicines that you should watch  for. Contact a doctor if: You think you are having a reaction to the medicine you are taking. You have headaches that keep coming back. You feel dizzy. You have swelling in your ankles. You have trouble with your vision. Get help right away if: You get a very bad headache. You start to feel mixed up (confused). You feel weak or numb. You feel faint. You have very bad pain in your: Chest. Belly (abdomen). You vomit more than once. You have trouble breathing. These symptoms may be an emergency. Get help right away. Call 911. Do not wait to see if the symptoms will go away. Do not drive yourself to the hospital. Summary Hypertension is another name for high blood pressure. High blood pressure forces your heart to work harder to pump blood. For most people, a normal blood pressure is less than 120/80. Making healthy choices can help lower blood pressure. If your blood pressure does not get lower with healthy choices, you may need to take medicine. This information is not intended to replace advice given to you by your health care provider. Make sure you discuss any questions you have with your health care provider.  You can take over the counter berberine to help with glucose control and cravings You can step up and down a stair as long as you have good stability.  Goal to exercise 150 minutes per week with at least 2 days of strength training Encouraged to park further when at the store, take stairs instead of elevators and to walk in place during commercials. Increase water intake to at least one gallon of water daily. Remember you can do this, you have done it before.  Document Revised: 09/17/2021 Document Reviewed: 09/17/2021 Elsevier Patient Education  2024 [employer].

## 2024-04-30 NOTE — Assessment & Plan Note (Signed)
 B/P is well controlled.  Continue current medications.  We could consider phentermine short term however he needs to have the foundation of weight loss down.

## 2024-04-30 NOTE — Progress Notes (Unsigned)
 [place] Favia, CMA,acting as a Neurosurgeon for Alex Epley, FNP.,have documented all relevant documentation on the behalf of Alex Epley, FNP,as directed by  Alex Epley, FNP while in the presence of Alex Epley, FNP.  Subjective:  Patient ID: Alex Harrison , male    DOB: 1971-09-22 , 53 y.o.   MRN: [id number]  Chief Complaint  Patient presents with   Hypertension    Patient presents today for a bp and pre dm follow up, Patient reports compliance with medication. Patient denies any chest pain, SOB, or headaches. Patient has no concerns today.    Prediabetes    HPI  Here for BP f/u.   Patient presents for medication refills and management of allergy symptoms. They report experiencing allergy symptoms due to pollen, currently managed with nasal spray and sinus pills. Patient has gained significant weight, with an increase of 12 pounds since November and almost 20 pounds since July. Current weight is 357 lbs. They have been experiencing cramps when sweating[place] Previously referred to a weight loss clinic but did not follow through due to cost concerns.     Past Medical History:  Diagnosis Date   Allergic rhinitis    Allergy    seasonal allergic rhinitis   Diabetes mellitus without complication (HCC)    Dizziness    GERD (gastroesophageal reflux disease)    past hx    Gout    Hyperlipidemia    Hypertension    Left arm pain    Left elbow pain    Macular degeneration    shots every 3 months    Morbid obesity (HCC)    Neck nodule 09/16/2010   right      Family History  Problem Relation Age of Onset   Hypertension Mother    Diabetes Mother    Stroke Mother    Hypertension Father    Diabetes Father    Diabetes Sister    Hypertension Sister    Colon cancer Neg Hx    Colon polyps Neg Hx    Esophageal cancer Neg Hx    Rectal cancer Neg Hx    Stomach cancer Neg Hx      Current Outpatient Medications:     ACCU-CHEK GUIDE test strip, CHECK BLOOD SUGAR IN THE MORNING, AT NOON, AND AT BEDTIME, Disp: 100 each, Rfl: 0   Accu-Chek Softclix Lancets lancets, CHECK BLOOD SUGAR IN THE MORNING, AT NOON, AND AT BEDTIME, Disp: 100 each, Rfl: 0   atorvastatin  (LIPITOR) 40 MG tablet, TAKE 1 TABLET BY MOUTH AT BEDTIME, Disp: 90 tablet, Rfl: 0   azelastine  (ASTELIN ) 0.1 % nasal spray, Place 2 sprays into both nostrils 2 (two) times daily. Use in each nostril as directed, Disp: 30 mL, Rfl: 5   Blood Glucose Monitoring Suppl DEVI, 1 each by Does not apply route in the morning, at noon, and at bedtime. May substitute to any manufacturer covered by patient's insurance., Disp: 1 each, Rfl: 0   Calcium  Carbonate (CALCIUM  600 PO), Take 1 tablet by mouth daily at 12 noon., Disp: , Rfl:    Cholecalciferol (VITAMIN D -3) 5000 UNITS TABS, Take by mouth., Disp: , Rfl:    Lancet Device MISC, 1 each by Does not apply route in the morning, at noon, and at bedtime. May substitute to any manufacturer covered by patient's insurance., Disp: 1 each, Rfl: 2   levocetirizine (XYZAL ) 5 MG tablet, TAKE 1 TABLET BY MOUTH ONCE DAILY IN  THE EVENING, Disp: 90 tablet, Rfl: 0   MAGNESIUM GLUCONATE PO, Take 400 mg by mouth daily., Disp: , Rfl:    meclizine  (ANTIVERT ) 25 MG tablet, Take 1 tablet by mouth three times daily as needed, Disp: 90 tablet, Rfl: 0   metFORMIN  (GLUCOPHAGE ) 500 MG tablet, TAKE 1 TABLET BY MOUTH TWICE DAILY WITH A MEAL, Disp: 180 tablet, Rfl: 0   Omega-3 Fatty Acids (FISH OIL PO), Take by mouth. 1 per day, Disp: , Rfl:    PROCTO-MED HC  2.5 % rectal cream, PLACE 1 APPLICATORFUL RECTALLY TWICE DAILY, Disp: 28 g, Rfl: 0   triamterene-hydrochlorothiazide (DYAZIDE) 37.5-25 MG capsule, Take 1 capsule by mouth daily., Disp: , Rfl:    allopurinol  (ZYLOPRIM ) 300 MG tablet, Take 1 tablet by mouth once daily, Disp: 90 tablet, Rfl: 0   montelukast  (SINGULAIR ) 10 MG tablet, Take 1 tablet by mouth once daily, Disp: 90 tablet, Rfl: 0    Allergies  Allergen Reactions   Niaspan  [Niacin  Er (Antihyperlipidemic)]     Skin burning and itching and painful to touch.     Review of Systems  Constitutional: Negative.   HENT: Negative.    Eyes: Negative.   Respiratory: Negative.    Cardiovascular: Negative.   Gastrointestinal: Negative.   Neurological: Negative.   Psychiatric/Behavioral: Negative.       Today's Vitals   04/30/24 0825  BP: 116/80  Pulse: 87  Temp: 98.2 F (36.8 C)  TempSrc: Oral  Weight: (!) 357 lb 3.2 oz (162 kg)  Height: 5\' 8"  (1.727 m)  PainSc: 0-No pain   Body mass index is 54.31 kg/m.  Wt Readings from Last 3 Encounters:  04/30/24 (!) 357 lb 3.2 oz (162 kg)  10/31/23 (!) 345 lb (156.5 kg)  07/05/23 (!) 339 lb (153.8 kg)     Objective:  Physical Exam Vitals and nursing note reviewed.  Constitutional:      General: He is not in acute distress.    Appearance: Normal appearance. He is obese.     Comments: Morbid   Cardiovascular:     Rate and Rhythm: Normal rate and regular rhythm.     Pulses: Normal pulses.     Heart sounds: Normal heart sounds. No murmur heard. Pulmonary:     Effort: Pulmonary effort is normal. No respiratory distress.     Breath sounds: Normal breath sounds. No wheezing.  Musculoskeletal:        General: No swelling or tenderness. Normal range of motion.  Skin:    General: Skin is warm and dry.  Neurological:     General: No focal deficit present.     Mental Status: He is alert and oriented to person, place, and time.     Cranial Nerves: No cranial nerve deficit.     Motor: No weakness.  Psychiatric:        Mood and Affect: Mood normal.        Behavior: Behavior normal.        Thought Content: Thought content normal.        Judgment: Judgment normal.     Assessment And Plan:  Hypertension, unspecified type Assessment & Plan: B/P is well controlled.  Continue current medications.  We could consider phentermine short term however he needs to have the  foundation of weight loss down.  Orders: -     BMP8+eGFR  Mixed hyperlipidemia Assessment & Plan: Cholesterol levels have been stable.  Continue statin tolerating well.  Orders: -     Lipid panel  Prediabetes Assessment & Plan: Chronic, stable. Continue metformin  tolerating well. Discussed once A1c above 6.4 this is considered diabetes and we could consider another medication to treat which will help with weight loss  Orders: -     Hemoglobin A1c  Snoring Assessment & Plan: I will order a home sleep study to evaluate for sleep apnea from [facility]. Discussed risk of cardiac events.    Class 3 severe obesity due to excess calories with serious comorbidity and body mass index (BMI) of 50.0 to 59.9 in adult Assessment & Plan: Chronic Discussed healthy diet and regular exercise options  Encouraged to exercise at least 150 minutes per week with 2 days of strength training as tolerated, consider getting a pedaler or recumbent bike to exercise due to his bilateral foot pain Long discussion about being consistent with regular exercise and eating healthy diet.  Explore barriers to weight [place], such [place] and [place] concerns Recommend starting with small, achievable goals like 5-10 minute daily walks      Return for controlled DM check-4 months.  Patient was given opportunity to ask questions. Patient verbalized understanding of the plan and was able to repeat key elements of the plan. All questions were answered to their satisfaction.    Inge Mangle, FNP, have reviewed all documentation for this visit. The documentation on 04/30/24 for the exam, diagnosis, procedures, and orders are all accurate and complete.   IF YOU HAVE BEEN REFERRED TO A SPECIALIST, IT MAY TAKE 1-2 WEEKS TO SCHEDULE/PROCESS THE REFERRAL. IF YOU HAVE NOT HEARD FROM US /SPECIALIST IN TWO WEEKS, PLEASE GIVE US  A CALL AT [phone] X [phone].

## 2024-04-30 NOTE — Assessment & Plan Note (Signed)
 Chronic Discussed healthy diet and regular exercise options  Encouraged to exercise at least 150 minutes per week with 2 days of strength training as tolerated, consider getting a pedaler or recumbent bike to exercise due to his bilateral foot pain Long discussion about being consistent with regular exercise and eating healthy diet.  Referred to [facility] to help with his diet however he said it was too much for the copay   - Discuss lifestyle modifications, including diet and exercise    - Consider referral to weight loss clinic or nutritionist    - Explore barriers to weight loss, such as [place] location and cost concerns    - Recommend starting with small, achievable goals like 5-10 minute daily walks

## 2024-04-30 NOTE — Assessment & Plan Note (Signed)
 Chronic, stable. Continue metformin  tolerating well. Discussed once A1c above 6.4 this is considered diabetes and we could consider another medication to treat which will help with weight loss

## 2024-05-01 ENCOUNTER — Ambulatory Visit: Payer: Self-pay | Admitting: Nurse Practitioner

## 2024-05-01 LAB — LIPID PANEL
Chol/HDL Ratio: 3.9 ratio (ref 0.0–5.0)
Cholesterol, Total: 132 mg/dL (ref 100–199)
HDL: 34 mg/dL — ABNORMAL LOW (ref >39)
LDL Chol Calc (NIH): 82 mg/dL (ref 0–99)
Triglycerides: 79 mg/dL (ref 0–149)
VLDL Cholesterol Cal: 16 mg/dL (ref 5–40)

## 2024-05-01 LAB — BMP8+EGFR
BUN/Creatinine Ratio: 12 (ref 9–20)
BUN: 13 mg/dL (ref 6–24)
CO2: 22 mmol/L (ref 20–29)
Calcium: 9.3 mg/dL (ref 8.7–10.2)
Chloride: 103 mmol/L (ref 96–106)
Creatinine, Ser: 1.12 mg/dL (ref 0.76–1.27)
Glucose: 96 mg/dL (ref 70–99)
Potassium: 4.4 mmol/L (ref 3.5–5.2)
Sodium: 141 mmol/L (ref 134–144)
eGFR: 78 mL/min/{1.73_m2} (ref >59)

## 2024-05-01 LAB — HEMOGLOBIN A1C
Est. average glucose Bld gHb Est-mCnc: 131 mg/dL
Hgb A1c MFr Bld: 6.2 % — ABNORMAL HIGH (ref 4.8–5.6)

## 2024-05-03 ENCOUNTER — Other Ambulatory Visit: Payer: Self-pay | Admitting: Nurse Practitioner

## 2024-05-03 DIAGNOSIS — M109 Gout, unspecified: Secondary | ICD-10-CM

## 2024-05-09 NOTE — Assessment & Plan Note (Signed)
 I will order a home sleep study to evaluate for sleep apnea from [facility]. Discussed risk of cardiac events.

## 2024-05-24 ENCOUNTER — Telehealth: Payer: Self-pay | Admitting: Nurse Practitioner

## 2024-05-24 NOTE — Telephone Encounter (Signed)
 [facility] confirmed they received information.

## 2024-06-09 ENCOUNTER — Other Ambulatory Visit: Payer: Self-pay | Admitting: Nurse Practitioner

## 2024-07-07 ENCOUNTER — Other Ambulatory Visit: Payer: Self-pay | Admitting: Nurse Practitioner

## 2024-07-07 DIAGNOSIS — R7303 Prediabetes: Secondary | ICD-10-CM

## 2024-07-07 DIAGNOSIS — J302 Other seasonal allergic rhinitis: Secondary | ICD-10-CM

## 2024-07-14 ENCOUNTER — Other Ambulatory Visit: Payer: Self-pay | Admitting: Nurse Practitioner

## 2024-07-14 DIAGNOSIS — E782 Mixed hyperlipidemia: Secondary | ICD-10-CM

## 2024-07-28 ENCOUNTER — Other Ambulatory Visit: Payer: Self-pay | Admitting: Nurse Practitioner

## 2024-07-28 DIAGNOSIS — J302 Other seasonal allergic rhinitis: Secondary | ICD-10-CM

## 2024-08-01 ENCOUNTER — Other Ambulatory Visit: Payer: Self-pay | Admitting: Nurse Practitioner

## 2024-08-01 DIAGNOSIS — M109 Gout, unspecified: Secondary | ICD-10-CM

## 2024-09-04 ENCOUNTER — Ambulatory Visit: Admitting: Nurse Practitioner

## 2024-09-04 ENCOUNTER — Encounter: Payer: Self-pay | Admitting: Nurse Practitioner

## 2024-09-04 VITALS — BP 118/60 | HR 78 | Temp 97.9°F | Ht 68.0 in | Wt 354.4 lb

## 2024-09-04 DIAGNOSIS — E66813 Obesity, class 3: Secondary | ICD-10-CM

## 2024-09-04 DIAGNOSIS — I10 Essential (primary) hypertension: Secondary | ICD-10-CM

## 2024-09-04 DIAGNOSIS — Z23 Encounter for immunization: Secondary | ICD-10-CM | POA: Insufficient documentation

## 2024-09-04 DIAGNOSIS — E782 Mixed hyperlipidemia: Secondary | ICD-10-CM | POA: Diagnosis not present

## 2024-09-04 DIAGNOSIS — H832X1 Labyrinthine dysfunction, right ear: Secondary | ICD-10-CM | POA: Diagnosis not present

## 2024-09-04 DIAGNOSIS — Z6841 Body Mass Index (BMI) 40.0 and over, adult: Secondary | ICD-10-CM

## 2024-09-04 DIAGNOSIS — R7303 Prediabetes: Secondary | ICD-10-CM

## 2024-09-04 DIAGNOSIS — Z139 Encounter for screening, unspecified: Secondary | ICD-10-CM

## 2024-09-04 NOTE — Assessment & Plan Note (Signed)
 Dietary modifications implemented with reduced sweets intake. No recent physical activity beyond yard work. - Continue dietary modifications to reduce sugar intake.

## 2024-09-04 NOTE — Assessment & Plan Note (Signed)
 Influenza vaccine administered Encouraged to take Tylenol as needed for fever or muscle aches.

## 2024-09-04 NOTE — Assessment & Plan Note (Signed)
 BP is well controlled Continue current medications

## 2024-09-04 NOTE — Assessment & Plan Note (Signed)
 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 Possible need for EKG prior to procedure. - Schedule physical exam. - Consider EKG closer to procedure date if requested by surgical team.

## 2024-09-04 NOTE — Assessment & Plan Note (Signed)
 Pneumococcal 20 given IM in office.

## 2024-09-04 NOTE — Progress Notes (Signed)
 LILLETTE Kristeen JINNY Gladis, CMA,acting as a Neurosurgeon for Alex Ada, FNP.,have documented all relevant documentation on the behalf of Alex Ada, FNP,as directed by  Alex Ada, FNP while in the presence of Alex Ada, FNP.  Subjective:  Patient ID: Alex Harrison , male    DOB: 04-Feb-1971 , 53 y.o.   MRN: [id number]  Chief Complaint  Patient presents with   Hypertension    Patient presents today for a bp and PRE dm follow up, Patient reports compliance with medication. Patient denies any chest pain, SOB, or headaches. Patient has no concerns today.    Discussed the use of AI scribe software for clinical note transcription with the patient, who gave verbal consent to proceed.  History of Present Illness Alex Harrison is a 54 year old male with hypertension and diabetes who presents for blood pressure and A1c check.  He has not required any medication refills and is currently taking triamterene and hydrochlorothiazide, which are filled by another doctor in [place]. He also takes Xyzal  and Singulair  regularly, and uses a nasal spray and Zestilene occasionally.  He mentions reducing his intake of sweets and salt, though he occasionally consumes chips. He has been more physically active, working in the yard.  He is planning to undergo a cochlear implant procedure on November 10th for his hearing. He is unsure if the implant team requires any information from his current medical provider.  No fever.   Hypertension This is a chronic problem. The current episode started more than 1 year ago. The problem is unchanged. Pertinent negatives include no anxiety, chest pain or palpitations. There are no associated agents to hypertension. Risk factors for coronary artery disease include obesity, sedentary lifestyle and dyslipidemia. Past treatments include diuretics. The current treatment provides significant improvement. There are no compliance problems.  There is no history of angina. There is no history of  chronic renal disease.  Hyperlipidemia This is a chronic problem. The problem is controlled. Recent lipid tests were reviewed and are normal. He has no history of chronic renal disease. There are no known factors aggravating his hyperlipidemia. Pertinent negatives include no chest pain. Risk factors for coronary artery disease include obesity, male sex and hypertension.     Past Medical History:  Diagnosis Date   Allergic rhinitis    Allergy    seasonal allergic rhinitis   Diabetes mellitus without complication (HCC)    Dizziness    GERD (gastroesophageal reflux disease)    past hx    Gout    Hyperlipidemia    Hypertension    Left arm pain    Left elbow pain    Macular degeneration    shots every 3 months    Morbid obesity (HCC)    Neck nodule 09/16/2010   right      Family History  Problem Relation Age of Onset   Hypertension Mother    Diabetes Mother    Stroke Mother    Hypertension Father    Diabetes Father    Diabetes Sister    Hypertension Sister    Colon cancer Neg Hx    Colon polyps Neg Hx    Esophageal cancer Neg Hx    Rectal cancer Neg Hx    Stomach cancer Neg Hx      Current Outpatient Medications:    ACCU-CHEK GUIDE test strip, CHECK BLOOD SUGAR IN THE MORNING, AT NOON, AND AT BEDTIME, Disp: 100 each, Rfl: 0   Accu-Chek Softclix Lancets lancets, CHECK BLOOD SUGAR IN THE MORNING,  AT NOON, AND AT BEDTIME, Disp: 100 each, Rfl: 0   allopurinol  (ZYLOPRIM ) 300 MG tablet, Take 1 tablet by mouth once daily, Disp: 90 tablet, Rfl: 0   atorvastatin  (LIPITOR) 40 MG tablet, TAKE 1 TABLET BY MOUTH AT BEDTIME, Disp: 90 tablet, Rfl: 0   azelastine  (ASTELIN ) 0.1 % nasal spray, Place 2 sprays into both nostrils 2 (two) times daily. Use in each nostril as directed, Disp: 30 mL, Rfl: 5   Blood Glucose Monitoring Suppl DEVI, 1 each by Does not apply route in the morning, at noon, and at bedtime. May substitute to any manufacturer covered by patient's insurance., Disp: 1 each,  Rfl: 0   Calcium  Carbonate (CALCIUM  600 PO), Take 1 tablet by mouth daily at 12 noon., Disp: , Rfl:    Cholecalciferol (VITAMIN D -3) 5000 UNITS TABS, Take by mouth., Disp: , Rfl:    Lancet Device MISC, 1 each by Does not apply route in the morning, at noon, and at bedtime. May substitute to any manufacturer covered by patient's insurance., Disp: 1 each, Rfl: 2   levocetirizine (XYZAL ) 5 MG tablet, TAKE 1 TABLET BY MOUTH ONCE DAILY IN THE EVENING, Disp: 90 tablet, Rfl: 0   MAGNESIUM GLUCONATE PO, Take 400 mg by mouth daily., Disp: , Rfl:    meclizine  (ANTIVERT ) 25 MG tablet, Take 1 tablet by mouth three times daily as needed, Disp: 90 tablet, Rfl: 0   metFORMIN  (GLUCOPHAGE ) 500 MG tablet, TAKE 1 TABLET BY MOUTH TWICE DAILY WITH A MEAL, Disp: 180 tablet, Rfl: 0   montelukast  (SINGULAIR ) 10 MG tablet, Take 1 tablet by mouth once daily, Disp: 90 tablet, Rfl: 0   Omega-3 Fatty Acids (FISH OIL PO), Take by mouth. 1 per day, Disp: , Rfl:    PROCTO-MED HC  2.5 % rectal cream, PLACE 1 APPLICATORFUL RECTALLY TWICE DAILY, Disp: 28 g, Rfl: 0   triamterene-hydrochlorothiazide (DYAZIDE) 37.5-25 MG capsule, Take 1 capsule by mouth daily., Disp: , Rfl:    Allergies  Allergen Reactions   Niaspan  [Niacin  Er (Antihyperlipidemic)]     Skin burning and itching and painful to touch.     Review of Systems  Constitutional: Negative.   HENT: Negative.    Eyes: Negative.   Respiratory: Negative.    Cardiovascular: Negative.  Negative for chest pain and palpitations.  Gastrointestinal: Negative.   Neurological: Negative.   Psychiatric/Behavioral: Negative.       Today's Vitals   09/04/24 0830  BP: 118/60  Pulse: 78  Temp: 97.9 F (36.6 C)  TempSrc: Oral  Weight: (!) 354 lb 6.4 oz (160.8 kg)  Height: 5' 8 (1.727 m)  PainSc: 0-No pain   Body mass index is 53.89 kg/m.  Wt Readings from Last 3 Encounters:  09/04/24 (!) 354 lb 6.4 oz (160.8 kg)  04/30/24 (!) 357 lb 3.2 oz (162 kg)  10/31/23 (!) 345 lb  (156.5 kg)     Objective:  Physical Exam Vitals and nursing note reviewed.  Constitutional:      General: He is not in acute distress.    Appearance: Normal appearance. He is obese.     Comments: Morbid   Cardiovascular:     Rate and Rhythm: Normal rate and regular rhythm.     Pulses: Normal pulses.     Heart sounds: Normal heart sounds. No murmur heard. Pulmonary:     Effort: Pulmonary effort is normal. No respiratory distress.     Breath sounds: Normal breath sounds. No wheezing.  Musculoskeletal:  General: No swelling or tenderness. Normal range of motion.  Skin:    General: Skin is warm and dry.  Neurological:     General: No focal deficit present.     Mental Status: He is alert and oriented to person, place, and time.     Cranial Nerves: No cranial nerve deficit.     Motor: No weakness.  Psychiatric:        Mood and Affect: Mood normal.        Behavior: Behavior normal.        Thought Content: Thought content normal.        Judgment: Judgment normal.      Assessment And Plan:  Mixed hyperlipidemia Assessment & Plan: Cholesterol levels have been stable.  Continue statin tolerating well.  Orders: -     Lipid panel  Hypertension, unspecified type Assessment & Plan: B/P is well controlled.  Continue current medications.   Orders: -     BMP8+eGFR  Prediabetes Assessment & Plan: Dietary modifications implemented with reduced sweets intake. No recent physical activity beyond yard work. - Continue dietary modifications to reduce sugar intake.  Orders: -     Hemoglobin A1c  Need for influenza vaccination Assessment & Plan: Influenza vaccine administered Encouraged to take Tylenol  as needed for fever or muscle aches.   Orders: -     Flu vaccine trivalent PF, 6mos and older(Flulaval,Afluria,Fluarix,Fluzone)  Need for pneumococcal vaccination Assessment & Plan: Pneumococcal 20 given IM in office.  Orders: -     Pneumococcal conjugate vaccine  20-valent  Class 3 severe obesity due to excess calories with serious comorbidity and body mass index (BMI) of 50.0 to 59.9 in adult Assessment & Plan: Chronic Discussed healthy diet and regular exercise options  Encouraged to exercise at least 150 minutes per week with 2 days of strength training as tolerated    Encounter for screening -     Hepatitis B surface antibody,qualitative  Vestibular hypofunction of right ear Assessment & Plan: Cochlear implant scheduled for November 10th. Possible need for EKG prior to procedure. - Schedule physical exam. - Consider EKG closer to procedure date if requested by surgical team.       Return in about 4 months (around 01/04/2025) for pre DM; next appt HM .   Patient was given opportunity to ask questions. Patient verbalized understanding of the plan and was able to repeat key elements of the plan. All questions were answered to their satisfaction.    LILLETTE Alex Ada, FNP, have reviewed all documentation for this visit. The documentation on 09/04/24 for the exam, diagnosis, procedures, and orders are all accurate and complete.   IF YOU HAVE BEEN REFERRED TO A SPECIALIST, IT MAY TAKE 1-2 WEEKS TO SCHEDULE/PROCESS THE REFERRAL. IF YOU HAVE NOT HEARD FROM US /SPECIALIST IN TWO WEEKS, PLEASE GIVE US  A CALL AT [phone].

## 2024-09-04 NOTE — Assessment & Plan Note (Signed)
Cholesterol levels have been stable.  Continue statin tolerating well.

## 2024-09-04 NOTE — Assessment & Plan Note (Signed)
 Chronic Discussed healthy diet and regular exercise options  Encouraged to exercise at least 150 minutes per week with 2 days of strength training as tolerated

## 2024-09-05 LAB — BMP8+EGFR
BUN/Creatinine Ratio: 13 (ref 9–20)
BUN: 14 mg/dL (ref 6–24)
CO2: 21 mmol/L (ref 20–29)
Calcium: 9.3 mg/dL (ref 8.7–10.2)
Chloride: 101 mmol/L (ref 96–106)
Creatinine, Ser: 1.06 mg/dL (ref 0.76–1.27)
Glucose: 82 mg/dL (ref 70–99)
Potassium: 4.1 mmol/L (ref 3.5–5.2)
Sodium: 139 mmol/L (ref 134–144)
eGFR: 83 mL/min/1.73 (ref >59)

## 2024-09-05 LAB — HEPATITIS B SURFACE ANTIBODY,QUALITATIVE: Hep B Surface Ab, Qual: REACTIVE

## 2024-09-05 LAB — LIPID PANEL
Chol/HDL Ratio: 4.1 ratio (ref 0.0–5.0)
Cholesterol, Total: 116 mg/dL (ref 100–199)
HDL: 28 mg/dL — ABNORMAL LOW (ref >39)
LDL Chol Calc (NIH): 71 mg/dL (ref 0–99)
Triglycerides: 89 mg/dL (ref 0–149)
VLDL Cholesterol Cal: 17 mg/dL (ref 5–40)

## 2024-09-05 LAB — HEMOGLOBIN A1C
Est. average glucose Bld gHb Est-mCnc: 123 mg/dL
Hgb A1c MFr Bld: 5.9 % — ABNORMAL HIGH (ref 4.8–5.6)

## 2024-10-07 ENCOUNTER — Other Ambulatory Visit: Payer: Self-pay | Admitting: Nurse Practitioner

## 2024-10-07 DIAGNOSIS — J302 Other seasonal allergic rhinitis: Secondary | ICD-10-CM

## 2024-10-07 DIAGNOSIS — R7303 Prediabetes: Secondary | ICD-10-CM

## 2024-10-11 ENCOUNTER — Other Ambulatory Visit: Payer: Self-pay | Admitting: Nurse Practitioner

## 2024-10-16 ENCOUNTER — Other Ambulatory Visit: Payer: Self-pay | Admitting: Nurse Practitioner

## 2024-10-16 DIAGNOSIS — E782 Mixed hyperlipidemia: Secondary | ICD-10-CM

## 2024-10-26 ENCOUNTER — Other Ambulatory Visit: Payer: Self-pay | Admitting: Nurse Practitioner

## 2024-10-26 DIAGNOSIS — M109 Gout, unspecified: Secondary | ICD-10-CM

## 2024-10-26 DIAGNOSIS — J302 Other seasonal allergic rhinitis: Secondary | ICD-10-CM

## 2024-12-21 LAB — OPHTHALMOLOGY REPORT-SCANNED

## 2025-01-03 ENCOUNTER — Other Ambulatory Visit: Payer: Self-pay | Admitting: Nurse Practitioner

## 2025-01-03 DIAGNOSIS — R7303 Prediabetes: Secondary | ICD-10-CM

## 2025-01-05 ENCOUNTER — Other Ambulatory Visit: Payer: Self-pay | Admitting: Nurse Practitioner

## 2025-01-05 DIAGNOSIS — R7303 Prediabetes: Secondary | ICD-10-CM

## 2025-01-08 ENCOUNTER — Ambulatory Visit: Payer: Self-pay | Admitting: Nurse Practitioner

## 2025-01-14 ENCOUNTER — Encounter: Payer: Self-pay | Admitting: Nurse Practitioner

## 2025-01-14 ENCOUNTER — Ambulatory Visit: Payer: Self-pay | Admitting: Nurse Practitioner

## 2025-01-14 DIAGNOSIS — I10 Essential (primary) hypertension: Secondary | ICD-10-CM

## 2025-01-14 DIAGNOSIS — E66813 Obesity, class 3: Secondary | ICD-10-CM

## 2025-01-14 DIAGNOSIS — E782 Mixed hyperlipidemia: Secondary | ICD-10-CM

## 2025-01-14 DIAGNOSIS — R7303 Prediabetes: Secondary | ICD-10-CM | POA: Diagnosis not present

## 2025-01-14 NOTE — Assessment & Plan Note (Signed)
"   Discussed oral Wegovy  for weight management, cash pay $150. He is interested and I made him aware this is a medication to take daily and will help with the snack cravings.  - Obtain updated weight during blood work. - Consider prescribing oral Wegovy  after weight update. "

## 2025-01-14 NOTE — Assessment & Plan Note (Addendum)
 A1c at 5.9, stable prediabetes, no diabetes or retinopathy. He does not have a diagnosis of diabetes, has not had an A1c greater than 6.4.  - Ordered A1c test for next Friday's blood work. - Obtain updated weight during blood work.

## 2025-01-14 NOTE — Assessment & Plan Note (Signed)
 Cholesterol levels improved at last visit.  Continue statin tolerating well.

## 2025-01-14 NOTE — Assessment & Plan Note (Signed)
 BP is well controlled Continue current medications

## 2025-01-25 ENCOUNTER — Other Ambulatory Visit: Payer: Self-pay

## 2025-04-09 ENCOUNTER — Encounter: Payer: Self-pay | Admitting: Nurse Practitioner
